# Patient Record
Sex: Female | Born: 1960 | Race: White | Hispanic: No | Marital: Married | Smoking: Current every day smoker
Health system: Southern US, Community
[De-identification: ages and names within clinical notes are randomized; demographics above are authoritative.]

## PROBLEM LIST (undated history)

## (undated) DIAGNOSIS — R011 Cardiac murmur, unspecified: Secondary | ICD-10-CM

## (undated) DIAGNOSIS — Z87898 Personal history of other specified conditions: Secondary | ICD-10-CM

## (undated) DIAGNOSIS — I10 Essential (primary) hypertension: Secondary | ICD-10-CM

## (undated) DIAGNOSIS — I447 Left bundle-branch block, unspecified: Secondary | ICD-10-CM

## (undated) DIAGNOSIS — E041 Nontoxic single thyroid nodule: Secondary | ICD-10-CM

## (undated) DIAGNOSIS — Z8632 Personal history of gestational diabetes: Secondary | ICD-10-CM

## (undated) DIAGNOSIS — M069 Rheumatoid arthritis, unspecified: Secondary | ICD-10-CM

## (undated) DIAGNOSIS — F17200 Nicotine dependence, unspecified, uncomplicated: Secondary | ICD-10-CM

## (undated) DIAGNOSIS — O24419 Gestational diabetes mellitus in pregnancy, unspecified control: Secondary | ICD-10-CM

## (undated) HISTORY — PX: COLON SURGERY: SHX602

## (undated) HISTORY — DX: Personal history of other specified conditions: Z87.898

## (undated) HISTORY — DX: Personal history of gestational diabetes: Z86.32

---

## 1990-11-28 HISTORY — PX: COLON SURGERY: SHX602

## 2005-06-12 ENCOUNTER — Observation Stay: Payer: Self-pay | Admitting: Infectious Diseases

## 2005-06-12 ENCOUNTER — Other Ambulatory Visit: Payer: Self-pay

## 2005-07-28 ENCOUNTER — Ambulatory Visit: Payer: Self-pay | Admitting: Internal Medicine

## 2005-09-12 ENCOUNTER — Ambulatory Visit: Payer: Self-pay | Admitting: Internal Medicine

## 2005-10-10 ENCOUNTER — Ambulatory Visit: Payer: Self-pay | Admitting: Internal Medicine

## 2005-11-14 ENCOUNTER — Ambulatory Visit: Payer: Self-pay | Admitting: Internal Medicine

## 2005-11-30 ENCOUNTER — Ambulatory Visit: Payer: Self-pay | Admitting: Internal Medicine

## 2006-02-13 ENCOUNTER — Ambulatory Visit: Payer: Self-pay | Admitting: Internal Medicine

## 2006-05-22 ENCOUNTER — Ambulatory Visit: Payer: Self-pay | Admitting: Internal Medicine

## 2006-05-30 ENCOUNTER — Ambulatory Visit: Payer: Self-pay | Admitting: Family Medicine

## 2006-06-06 ENCOUNTER — Ambulatory Visit: Payer: Self-pay | Admitting: Internal Medicine

## 2006-06-08 ENCOUNTER — Ambulatory Visit: Payer: Self-pay | Admitting: Internal Medicine

## 2006-06-26 ENCOUNTER — Ambulatory Visit: Payer: Self-pay | Admitting: Internal Medicine

## 2006-07-14 ENCOUNTER — Ambulatory Visit: Payer: Self-pay | Admitting: Internal Medicine

## 2006-07-24 ENCOUNTER — Ambulatory Visit: Payer: Self-pay | Admitting: Internal Medicine

## 2006-09-11 ENCOUNTER — Ambulatory Visit: Payer: Self-pay | Admitting: Internal Medicine

## 2006-12-05 ENCOUNTER — Ambulatory Visit: Payer: Self-pay | Admitting: Internal Medicine

## 2006-12-08 ENCOUNTER — Ambulatory Visit: Payer: Self-pay | Admitting: Internal Medicine

## 2007-02-26 ENCOUNTER — Encounter: Payer: Self-pay | Admitting: Internal Medicine

## 2007-02-26 DIAGNOSIS — K219 Gastro-esophageal reflux disease without esophagitis: Secondary | ICD-10-CM | POA: Insufficient documentation

## 2007-02-26 DIAGNOSIS — M109 Gout, unspecified: Secondary | ICD-10-CM | POA: Insufficient documentation

## 2007-02-26 DIAGNOSIS — I10 Essential (primary) hypertension: Secondary | ICD-10-CM | POA: Insufficient documentation

## 2007-02-27 ENCOUNTER — Encounter: Payer: Self-pay | Admitting: Internal Medicine

## 2007-04-02 ENCOUNTER — Emergency Department (HOSPITAL_COMMUNITY): Admission: EM | Admit: 2007-04-02 | Discharge: 2007-04-02 | Payer: Self-pay | Admitting: Emergency Medicine

## 2007-05-23 ENCOUNTER — Ambulatory Visit: Payer: Self-pay | Admitting: Internal Medicine

## 2007-05-23 DIAGNOSIS — R109 Unspecified abdominal pain: Secondary | ICD-10-CM | POA: Insufficient documentation

## 2008-03-31 ENCOUNTER — Telehealth (INDEPENDENT_AMBULATORY_CARE_PROVIDER_SITE_OTHER): Payer: Self-pay | Admitting: *Deleted

## 2009-05-11 ENCOUNTER — Emergency Department (HOSPITAL_COMMUNITY): Admission: EM | Admit: 2009-05-11 | Discharge: 2009-05-11 | Payer: Self-pay | Admitting: Emergency Medicine

## 2009-07-24 ENCOUNTER — Encounter: Admission: RE | Admit: 2009-07-24 | Discharge: 2009-07-24 | Payer: Self-pay | Admitting: Internal Medicine

## 2009-07-28 ENCOUNTER — Encounter: Admission: RE | Admit: 2009-07-28 | Discharge: 2009-07-28 | Payer: Self-pay | Admitting: Internal Medicine

## 2009-07-29 ENCOUNTER — Encounter: Admission: RE | Admit: 2009-07-29 | Discharge: 2009-07-29 | Payer: Self-pay | Admitting: Internal Medicine

## 2009-07-29 ENCOUNTER — Encounter (INDEPENDENT_AMBULATORY_CARE_PROVIDER_SITE_OTHER): Payer: Self-pay | Admitting: Diagnostic Radiology

## 2009-08-19 ENCOUNTER — Encounter: Admission: RE | Admit: 2009-08-19 | Discharge: 2009-08-19 | Payer: Self-pay | Admitting: Internal Medicine

## 2010-06-24 ENCOUNTER — Other Ambulatory Visit: Admission: RE | Admit: 2010-06-24 | Discharge: 2010-06-24 | Payer: Self-pay | Admitting: Obstetrics and Gynecology

## 2010-12-20 ENCOUNTER — Encounter: Payer: Self-pay | Admitting: Internal Medicine

## 2010-12-28 NOTE — Consult Note (Signed)
Summary: Consultation Report  Consultation Report   Imported By: Beau Fanny 05/23/2007 16:26:23  _____________________________________________________________________  External Attachment:    Type:   Image     Comment:   External Document

## 2010-12-28 NOTE — Assessment & Plan Note (Signed)
Summary: 2:00pm,vomiting/bir   Vital Signs:  Patient Profile:   50 Years Old Female Height:     62.75 inches (159.38 cm) Weight:      118.13 pounds Temp:     97.3 degrees F oral Pulse rate:   76 / minute BP sitting:   112 / 64  (left arm)  Vitals Entered By: Wandra Mannan (May 23, 2007 2:03 PM)               Chief Complaint:  vomiting  .  History of Present Illness: Didn't fell well 6/25PM Awoke the next day vomiting, stomach burning Noone else sick Feels weak, sleeping all the time Not eating much Vomiting has resolved but has constant belching for some time--occ with water brash  Was on prilosec but stopped--heard of possible heart problems No hematemesis Some loose and more freq stools.  Slight dark but not black and no blood  On prednisone from Dr Rowe--referred from Dr Lajoyce [facility] since April---has helped foot  No fever but felt clammy Has usual smokers cough, no breathing problems    Past Medical History:    Reviewed history from 02/26/2007 and no changes required:       GERD       Gout       Hypertension 2006     Review of Systems      See HPI   Physical Exam  General:     NAD but slightly washed out Lungs:     normal respiratory effort and normal breath sounds.   Abdomen:     soft.   Mild tenderness just above umbilicus. No rebound    Impression & Recommendations:  Problem # 1:  ABDOMINAL PAIN, ACUTE (ICD-789.00) Assessment: New she has abdominal pain associated with vomiting and fatigue.  She had ongoing problems with heartburn including belching and water brash.  She is now on prednisone which has been effective for her foot pain but may be causing gastritis or even an ulcer.  There is no signs that she's had GI bleeding now.    Plan: She is to back on her Prilosec.  She doesn't have any at home son is here to have some samples of something to get her started with tonight and then she gets a Armed forces operational officer.  She has ongoing  symptoms and probably needs this indefinitely and certainly as long as she is on the prednisone.  Medications Added to Medication List This Visit: 1)  Lisinopril 20 Mg Tabs (Lisinopril) .Marland Kitchen.. 1 daily 2)  Adult Aspirin Low Strength 81 Mg Tbdp (Aspirin) .Marland Kitchen.. 1 daily   Patient Instructions: 1)  Restart Prilosec and stay on every day. Take it before supper then every morning before breakfast      Current Medications (including changes made in today's visit):  LISINOPRIL 20 MG  TABS (LISINOPRIL) 1 daily ADULT ASPIRIN LOW STRENGTH 81 MG  TBDP (ASPIRIN) 1 daily

## 2010-12-28 NOTE — Progress Notes (Signed)
  Phone Note Refill Request Message from:  [phone]  Refills Requested: Medication #1:  LISINOPRIL 20 MG  TABS 1 daily  Method Requested: Fax to Local Pharmacy Initial call taken by: Wandra Mannan,  Mar 31, 2008 9:02 AM  Follow-up for Phone Call        okay x 1 year  Needs to schedule follow up visit Follow-up by: Cindee Salt MD,  Mar 31, 2008 10:13 AM  Additional Follow-up for Phone Call Additional follow up Details #1::        rx faxed Additional Follow-up by: Wandra Mannan,  Mar 31, 2008 10:23 AM

## 2011-03-07 LAB — CBC
HCT: 39.2 % (ref 36.0–46.0)
Hemoglobin: 13.3 g/dL (ref 12.0–15.0)
MCHC: 33.9 g/dL (ref 30.0–36.0)
MCV: 97.3 fL (ref 78.0–100.0)
Platelets: 275 10*3/uL (ref 150–400)
RBC: 4.03 MIL/uL (ref 3.87–5.11)
RDW: 13.5 % (ref 11.5–15.5)
WBC: 6.8 10*3/uL (ref 4.0–10.5)

## 2011-03-07 LAB — COMPREHENSIVE METABOLIC PANEL
ALT: 18 U/L (ref 0–35)
AST: 23 U/L (ref 0–37)
Albumin: 3.5 g/dL (ref 3.5–5.2)
Alkaline Phosphatase: 57 U/L (ref 39–117)
BUN: 4 mg/dL — ABNORMAL LOW (ref 6–23)
CO2: 26 mEq/L (ref 19–32)
Calcium: 9.1 mg/dL (ref 8.4–10.5)
Chloride: 112 mEq/L (ref 96–112)
Creatinine, Ser: 0.54 mg/dL (ref 0.4–1.2)
GFR calc Af Amer: >60 mL/min (ref >60)
GFR calc non Af Amer: >60 mL/min (ref >60)
Glucose, Bld: 92 mg/dL (ref 70–99)
Potassium: 4.1 mEq/L (ref 3.5–5.1)
Sodium: 143 mEq/L (ref 135–145)
Total Bilirubin: 0.5 mg/dL (ref 0.3–1.2)
Total Protein: 6.3 g/dL (ref 6.0–8.3)

## 2011-03-07 LAB — DIFFERENTIAL
Basophils Absolute: 0 10*3/uL (ref 0.0–0.1)
Basophils Relative: 0 % (ref 0–1)
Eosinophils Absolute: 0 10*3/uL (ref 0.0–0.7)
Eosinophils Relative: 0 % (ref 0–5)
Lymphocytes Relative: 12 % (ref 12–46)
Lymphs Abs: 0.8 10*3/uL (ref 0.7–4.0)
Monocytes Absolute: 0.5 10*3/uL (ref 0.1–1.0)
Monocytes Relative: 7 % (ref 3–12)
Neutro Abs: 5.4 10*3/uL (ref 1.7–7.7)
Neutrophils Relative %: 80 % — ABNORMAL HIGH (ref 43–77)

## 2011-03-07 LAB — POCT CARDIAC MARKERS
CKMB, poc: <1 ng/mL — ABNORMAL LOW (ref 1.0–8.0)
CKMB, poc: <1 ng/mL — ABNORMAL LOW (ref 1.0–8.0)
Myoglobin, poc: 25.8 ng/mL (ref 12–200)
Myoglobin, poc: 37.9 ng/mL (ref 12–200)
Troponin i, poc: <0.05 ng/mL (ref 0.00–0.09)
Troponin i, poc: <0.05 ng/mL (ref 0.00–0.09)

## 2011-03-07 LAB — MAGNESIUM: Magnesium: 2 mg/dL (ref 1.5–2.5)

## 2012-05-22 ENCOUNTER — Emergency Department (HOSPITAL_COMMUNITY)
Admission: EM | Admit: 2012-05-22 | Discharge: 2012-05-22 | Disposition: A | Discharge status: Home / Self Care | Payer: 59 | Source: Home / Self Care | Attending: Emergency Medicine | Admitting: Emergency Medicine

## 2012-05-22 ENCOUNTER — Encounter (HOSPITAL_COMMUNITY): Payer: Self-pay | Admitting: Emergency Medicine

## 2012-05-22 DIAGNOSIS — E860 Dehydration: Secondary | ICD-10-CM

## 2012-05-22 DIAGNOSIS — R112 Nausea with vomiting, unspecified: Secondary | ICD-10-CM

## 2012-05-22 HISTORY — DX: Rheumatoid arthritis, unspecified: M06.9

## 2012-05-22 HISTORY — DX: Essential (primary) hypertension: I10

## 2012-05-22 LAB — POCT I-STAT, CHEM 8
BUN: 4 mg/dL — ABNORMAL LOW (ref 6–23)
Calcium, Ion: 1.21 mmol/L (ref 1.12–1.32)
Chloride: 104 mEq/L (ref 96–112)
Creatinine, Ser: 0.6 mg/dL (ref 0.50–1.10)
Glucose, Bld: 108 mg/dL — ABNORMAL HIGH (ref 70–99)
HCT: 45 % (ref 36.0–46.0)
Hemoglobin: 15.3 g/dL — ABNORMAL HIGH (ref 12.0–15.0)
Potassium: 3.5 mEq/L (ref 3.5–5.1)
Sodium: 138 mEq/L (ref 135–145)
TCO2: 22 mmol/L (ref 0–100)

## 2012-05-22 MED ORDER — HYDROXYZINE HCL 25 MG PO TABS: 25.0000 mg | ORAL_TABLET | Freq: Four times a day (QID) | ORAL | Status: AC

## 2012-05-22 MED ORDER — ONDANSETRON HCL 4 MG PO TABS: 4.0000 mg | ORAL_TABLET | Freq: Three times a day (TID) | ORAL | Status: AC | PRN

## 2012-05-22 NOTE — ED Notes (Signed)
Instructed to place on gown for physician examination 

## 2012-05-22 NOTE — ED Provider Notes (Signed)
History     CSN: [id number]  Arrival date & time 05/22/12  1100   First MD Initiated Contact with Patient 05/22/12 1113      Chief Complaint  Patient presents with  . Emesis    (Consider location/radiation/quality/duration/timing/severity/associated sxs/prior treatment) HPI Comments: Patient process urgent care reporting that yesterday she had an upset stomach vomited 3-4 times and had about 2-3 episodes of liquidy diarrhea. She describes that her symptoms have subsided today she went to work but was feeling very weak and fatigued. She had only English muffin and a cold is morning she denies any fevers or abdominal pain at this point. She also is wondering if she had some type of allergic reaction she had several mosquito bites a couple days ago and had a multiple bites on her lower extremities and has some bruising in the outer aspect of her left leg that she attributes to kill and a mosquitoes.  She denies any abdominal pain, no further vomiting, no further rashes or ecchymotic areas anywhere else. No blood in urine, no swelling or tenderness in any joint.  Patient is a 51 y.o. female presenting with vomiting. The history is provided by the patient.  Emesis  This is a new problem. The current episode started yesterday. The problem occurs 5 to 10 times per day. The problem has been gradually improving. Associated symptoms include abdominal pain, chills, diarrhea and headaches. Pertinent negatives include no arthralgias, no cough, no fever, no myalgias and no URI.    Past Medical History  Diagnosis Date  . Hypertension   . Rheumatoid arthritis     Past Surgical History  Procedure Date  . Cesarean section     No family history on file.  History  Substance Use Topics  . Smoking status: Current Everyday Smoker  . Smokeless tobacco: Not on file  . Alcohol Use: Yes    OB History    Grav Para Term Preterm Abortions TAB SAB Ect Mult Living                  Review of Systems   Constitutional: Positive for chills and fatigue. Negative for fever, activity change and unexpected weight change.  Eyes: Negative for redness, itching and visual disturbance.  Respiratory: Negative for cough.   Gastrointestinal: Positive for vomiting, abdominal pain and diarrhea.  Musculoskeletal: Negative for myalgias and arthralgias.  Skin: Negative for rash and wound.  Neurological: Positive for headaches. Negative for dizziness and syncope.    Allergies  Review of patient's allergies indicates no known allergies.  Home Medications   Current Outpatient Rx  Name Route Sig Dispense Refill  . AMLODIPINE BESYLATE 5 MG PO TABS Oral Take 5 mg by mouth daily.    . ASPIRIN 325 MG PO TBEC Oral Take 325 mg by mouth daily.    . CERTOLIZUMAB PEGOL 2 X 200 MG Scurry KIT Subcutaneous Inject into the skin.    Marland Kitchen VITAMIN D PO Oral Take by mouth.    Marland Kitchen HYDROXYZINE HCL 25 MG PO TABS Oral Take 1 tablet (25 mg total) by mouth every 6 (six) hours. 12 tablet 0  . ONDANSETRON HCL 4 MG PO TABS Oral Take 1 tablet (4 mg total) by mouth every 8 (eight) hours as needed for nausea. 20 tablet 0    BP 138/87  Pulse 92  Temp 98.5 F (36.9 C) (Oral)  Resp 16  SpO2 99%  LMP 04/26/2012  Physical Exam  Nursing note and vitals reviewed. Constitutional: Vital signs  are normal. She appears well-developed and well-nourished.  Non-toxic appearance. She does not have a sickly appearance. She does not appear ill. No distress.  HENT:  Head: Normocephalic.  Eyes: Conjunctivae are normal.  Neck: Neck supple.  Pulmonary/Chest: Effort normal and breath sounds normal. She has no decreased breath sounds. She has no wheezes. She has no rhonchi. She has no rales.  Abdominal: Soft. Normal appearance. She exhibits no shifting dullness, no distension, no pulsatile liver and no fluid wave. There is no hepatosplenomegaly. There is generalized tenderness. There is no rigidity, no rebound, no guarding, no CVA tenderness, no tenderness  at McBurney's point and negative Murphy's sign.  Neurological: She is alert.  Skin: Rash noted. There is erythema.    ED Course  Procedures (including critical care time)  Labs Reviewed  POCT I-STAT, CHEM 8 - Abnormal; Notable for the following:    BUN 4 (*)     Glucose, Bld 108 (*)     Hemoglobin 15.3 (*)     All other components within normal limits   No results found.   1. Nausea & vomiting   2. Dehydration       MDM  Patient presented today to urgent care with 2 different concerns both a new lead develop rash and resolving upper abdominal discomfort with vomiting and diarrhea. Patient with mild clinical dehydration normal labs normal abdominal exam looks comfortable. A papular eruption to both dorsal aspect of both forearms she was encouraged to followup with [facility] she's been described that this rash has been there for years. She also had a rash secondary to mosquito bites and 2 ecchymotic areas on the lateral aspect of her left leg. It would probably trauma induced and she was trying to kill dust mosquitoes. Have encouraged patient to manage her symptoms symptomatically for the next 48 hours and discussed symptoms that would warrant further evaluation in the emergency department. He requested a work note and she is feeling somewhat tired and drained with vomiting and diarrhea yesterday. I agreed to give her 48 hours off work        Jimmie Molly, MD 05/22/12 1359

## 2012-05-22 NOTE — ED Notes (Signed)
Patient concerned symptoms related to mosquitto bites on back of left leg.  2 large areas of bruising

## 2012-05-22 NOTE — Discharge Instructions (Signed)
Dehydration, Adult Dehydration means your body does not have as much fluid as it needs. Your kidneys, brain, and heart will not work properly without the right amount of fluids and salt.  HOME CARE  Ask your doctor how to replace body fluid losses (rehydrate).   Drink enough fluids to keep your pee (urine) clear or pale yellow.   Drink small amounts of fluids often if you feel sick to your stomach (nauseous) or throw up (vomit).   Eat like you normally do.   Avoid:   Foods or drinks high in sugar.   Bubbly (carbonated) drinks.   Juice.   Very hot or cold fluids.   Drinks with caffeine.   Fatty, greasy foods.   Alcohol.   Tobacco.   Eating too much.   Gelatin desserts.   Wash your hands to avoid spreading germs (bacteria, viruses).   Only take medicine as told by your doctor.   Keep all doctor visits as told.  GET HELP RIGHT AWAY IF:   You cannot drink something without throwing up.   You get worse even with treatment.   Your vomit has blood in it or looks greenish.   Your poop (stool) has blood in it or looks black and tarry.   You have not peed in 6 to 8 hours.   You pee a small amount of very dark pee.   You have a fever.   You pass out (faint).   You have belly (abdominal) pain that gets worse or stays in one spot (localizes).   You have a rash, stiff neck, or bad headache.   You get easily annoyed, sleepy, or are hard to wake up.   You feel weak, dizzy, or very thirsty.  MAKE SURE YOU:   Understand these instructions.   Will watch your condition.   Will get help right away if you are not doing well or get worse.  Document Released: 09/10/2009 Document Revised: 11/03/2011 Document Reviewed: 07/04/2011 Exit[employer] Patient Information 2012 [employer]. 

## 2012-05-22 NOTE — ED Notes (Signed)
Yesterday had upset stomach reports vomiting and diarrhea.  No episodes of v/d during the night.  Today went to work and had episode of feeling weak.  Reports feeling faint, did not pass out.  Patient had an english muffin and coke this am prior to episode.  Denies fever.  Reports chills this am.

## 2012-08-21 ENCOUNTER — Other Ambulatory Visit: Payer: Self-pay | Admitting: Obstetrics and Gynecology

## 2012-08-21 ENCOUNTER — Other Ambulatory Visit (HOSPITAL_COMMUNITY)
Admission: RE | Admit: 2012-08-21 | Discharge: 2012-08-21 | Disposition: A | Discharge status: Home / Self Care | Payer: 59 | Source: Ambulatory Visit | Attending: Obstetrics and Gynecology | Admitting: Obstetrics and Gynecology

## 2012-08-21 DIAGNOSIS — Z01419 Encounter for gynecological examination (general) (routine) without abnormal findings: Secondary | ICD-10-CM | POA: Insufficient documentation

## 2012-09-13 ENCOUNTER — Other Ambulatory Visit: Payer: Self-pay | Admitting: Obstetrics and Gynecology

## 2012-11-26 ENCOUNTER — Encounter (HOSPITAL_COMMUNITY): Payer: Self-pay | Admitting: Pharmacist

## 2012-12-02 ENCOUNTER — Other Ambulatory Visit: Payer: Self-pay | Admitting: Obstetrics and Gynecology

## 2012-12-03 ENCOUNTER — Other Ambulatory Visit: Payer: Self-pay

## 2012-12-03 ENCOUNTER — Encounter (HOSPITAL_COMMUNITY)
Admission: RE | Admit: 2012-12-03 | Discharge: 2012-12-03 | Disposition: A | Discharge status: Home / Self Care | Payer: 59 | Source: Ambulatory Visit | Attending: Obstetrics and Gynecology | Admitting: Obstetrics and Gynecology

## 2012-12-03 ENCOUNTER — Encounter (HOSPITAL_COMMUNITY): Payer: Self-pay

## 2012-12-03 DIAGNOSIS — Z01818 Encounter for other preprocedural examination: Secondary | ICD-10-CM | POA: Insufficient documentation

## 2012-12-03 DIAGNOSIS — Z01812 Encounter for preprocedural laboratory examination: Secondary | ICD-10-CM | POA: Insufficient documentation

## 2012-12-03 HISTORY — DX: Nicotine dependence, unspecified, uncomplicated: F17.200

## 2012-12-03 LAB — COMPREHENSIVE METABOLIC PANEL
ALT: 26 U/L (ref 0–35)
AST: 30 U/L (ref 0–37)
Albumin: 3.6 g/dL (ref 3.5–5.2)
Alkaline Phosphatase: 64 U/L (ref 39–117)
BUN: 6 mg/dL (ref 6–23)
CO2: 23 mEq/L (ref 19–32)
Calcium: 9.2 mg/dL (ref 8.4–10.5)
Chloride: 99 mEq/L (ref 96–112)
Creatinine, Ser: 0.48 mg/dL — ABNORMAL LOW (ref 0.50–1.10)
GFR calc Af Amer: >90 mL/min (ref >90)
GFR calc non Af Amer: >90 mL/min (ref >90)
Glucose, Bld: 161 mg/dL — ABNORMAL HIGH (ref 70–99)
Potassium: 3 mEq/L — ABNORMAL LOW (ref 3.5–5.1)
Sodium: 135 mEq/L (ref 135–145)
Total Bilirubin: 0.4 mg/dL (ref 0.3–1.2)
Total Protein: 6.8 g/dL (ref 6.0–8.3)

## 2012-12-03 LAB — TYPE AND SCREEN
ABO/RH(D): O NEG
Antibody Screen: NEGATIVE

## 2012-12-03 LAB — CBC
HCT: 40.4 % (ref 36.0–46.0)
Hemoglobin: 14 g/dL (ref 12.0–15.0)
MCH: 31.7 pg (ref 26.0–34.0)
MCHC: 34.7 g/dL (ref 30.0–36.0)
MCV: 91.4 fL (ref 78.0–100.0)
Platelets: 275 10*3/uL (ref 150–400)
RBC: 4.42 MIL/uL (ref 3.87–5.11)
RDW: 13.7 % (ref 11.5–15.5)
WBC: 7.1 10*3/uL (ref 4.0–10.5)

## 2012-12-03 LAB — SURGICAL PCR SCREEN
MRSA, PCR: NEGATIVE
Staphylococcus aureus: NEGATIVE

## 2012-12-03 LAB — ABO/RH: ABO/RH(D): O NEG

## 2012-12-03 NOTE — Progress Notes (Signed)
Labs back potassium low at 3, [name] notified at [name] Physician OB[facility]GY[facility]

## 2012-12-03 NOTE — Patient Instructions (Addendum)
   Your procedure is scheduled [id number] January 7th  Enter through the Main Entrance of [facility] at:11am Pick up the phone at the desk and dial [phone] and inform us of your arrival.  Please call this number if you have any problems the morning of surgery: [phone]  Remember: Do not eat food after midnight on Monday You may have clear liquids until 8:30am on Tuesday then nothing Please take your norvasc morning of surgery  Do not wear jewelry, make-up, or FINGER nail polish No metal in your hair or on your body. Do not wear lotions, powders, perfumes. You may wear deodorant.  Please use your CHG wash as directed prior to surgery.  Do not shave anywhere for at least 12 hours prior to first CHG shower.  Do not bring valuables to the hospital. Contacts, dentures or bridgework may not be worn into surgery.  Leave suitcase in the car. After Surgery it may be brought to your room. For patients being admitted to the hospital, checkout time is 11:00am the day of discharge.  Patients discharged on the day of surgery will not be allowed to drive home.

## 2012-12-03 NOTE — Pre-Procedure Instructions (Signed)
Spoke to Anesthesiologist concerning patient status-history of smoking, hypertenison (b/p at pat visit 138/80), reviewed new ekg with LBBB and EKG from 2010. Dr Rodman Pickle wants Cardiac Clearance prior to surgery. Dr Richardson Dopp notified-Dr Richardson Dopp to call [facility] for patient appointment -returned call and spoke to patient. Surgery is cancelled

## 2012-12-04 ENCOUNTER — Encounter (HOSPITAL_COMMUNITY): Admission: RE | Payer: Self-pay | Source: Ambulatory Visit

## 2012-12-04 ENCOUNTER — Ambulatory Visit (HOSPITAL_COMMUNITY): Admission: RE | Admit: 2012-12-04 | Payer: 59 | Source: Ambulatory Visit | Admitting: Obstetrics and Gynecology

## 2012-12-04 SURGERY — ROBOTIC ASSISTED TOTAL HYSTERECTOMY WITH BILATERAL SALPINGO OOPHORECTOMY
Anesthesia: Choice | Laterality: Bilateral

## 2012-12-19 ENCOUNTER — Inpatient Hospital Stay (HOSPITAL_COMMUNITY)
Admission: RE | Admit: 2012-12-19 | Discharge: 2012-12-21 | DRG: 742 | Disposition: A | Discharge status: Home / Self Care | Payer: 59 | Source: Ambulatory Visit | Attending: Obstetrics and Gynecology | Admitting: Obstetrics and Gynecology

## 2012-12-19 ENCOUNTER — Encounter (HOSPITAL_COMMUNITY): Payer: Self-pay | Admitting: Anesthesiology

## 2012-12-19 ENCOUNTER — Ambulatory Visit (HOSPITAL_COMMUNITY): Payer: 59 | Admitting: Anesthesiology

## 2012-12-19 ENCOUNTER — Encounter (HOSPITAL_COMMUNITY): Payer: Self-pay | Admitting: *Deleted

## 2012-12-19 ENCOUNTER — Encounter (HOSPITAL_COMMUNITY)
Admission: RE | Disposition: A | Discharge status: Home / Self Care | Payer: Self-pay | Source: Ambulatory Visit | Attending: Obstetrics and Gynecology

## 2012-12-19 DIAGNOSIS — D252 Subserosal leiomyoma of uterus: Secondary | ICD-10-CM | POA: Diagnosis present

## 2012-12-19 DIAGNOSIS — N801 Endometriosis of ovary: Secondary | ICD-10-CM | POA: Diagnosis present

## 2012-12-19 DIAGNOSIS — Z5331 Laparoscopic surgical procedure converted to open procedure: Secondary | ICD-10-CM

## 2012-12-19 DIAGNOSIS — Y9210 Unspecified residential institution as the place of occurrence of the external cause: Secondary | ICD-10-CM | POA: Diagnosis not present

## 2012-12-19 DIAGNOSIS — D219 Benign neoplasm of connective and other soft tissue, unspecified: Secondary | ICD-10-CM | POA: Diagnosis present

## 2012-12-19 DIAGNOSIS — IMO0002 Reserved for concepts with insufficient information to code with codable children: Secondary | ICD-10-CM | POA: Diagnosis not present

## 2012-12-19 DIAGNOSIS — N838 Other noninflammatory disorders of ovary, fallopian tube and broad ligament: Secondary | ICD-10-CM | POA: Diagnosis present

## 2012-12-19 DIAGNOSIS — I10 Essential (primary) hypertension: Secondary | ICD-10-CM | POA: Diagnosis present

## 2012-12-19 DIAGNOSIS — N8000 Endometriosis of the uterus, unspecified: Secondary | ICD-10-CM | POA: Diagnosis present

## 2012-12-19 DIAGNOSIS — N938 Other specified abnormal uterine and vaginal bleeding: Secondary | ICD-10-CM | POA: Diagnosis present

## 2012-12-19 DIAGNOSIS — D251 Intramural leiomyoma of uterus: Principal | ICD-10-CM | POA: Diagnosis present

## 2012-12-19 DIAGNOSIS — Z9079 Acquired absence of other genital organ(s): Secondary | ICD-10-CM

## 2012-12-19 DIAGNOSIS — N925 Other specified irregular menstruation: Secondary | ICD-10-CM | POA: Diagnosis present

## 2012-12-19 DIAGNOSIS — Z90710 Acquired absence of both cervix and uterus: Secondary | ICD-10-CM

## 2012-12-19 DIAGNOSIS — N80109 Endometriosis of ovary, unspecified side, unspecified depth: Secondary | ICD-10-CM | POA: Diagnosis present

## 2012-12-19 DIAGNOSIS — N946 Dysmenorrhea, unspecified: Secondary | ICD-10-CM | POA: Diagnosis present

## 2012-12-19 DIAGNOSIS — N949 Unspecified condition associated with female genital organs and menstrual cycle: Secondary | ICD-10-CM | POA: Diagnosis present

## 2012-12-19 HISTORY — PX: CYSTOSCOPY: SHX5120

## 2012-12-19 HISTORY — PX: ROBOTIC ASSISTED TOTAL HYSTERECTOMY: SHX6085

## 2012-12-19 HISTORY — PX: ABDOMINAL HYSTERECTOMY: SHX81

## 2012-12-19 HISTORY — PX: BILATERAL SALPINGECTOMY: SHX5743

## 2012-12-19 LAB — BASIC METABOLIC PANEL
BUN: 7 mg/dL (ref 6–23)
CO2: 22 mEq/L (ref 19–32)
Calcium: 8.7 mg/dL (ref 8.4–10.5)
Chloride: 99 mEq/L (ref 96–112)
Creatinine, Ser: 0.51 mg/dL (ref 0.50–1.10)
GFR calc Af Amer: >90 mL/min (ref >90)
GFR calc non Af Amer: >90 mL/min (ref >90)
Glucose, Bld: 93 mg/dL (ref 70–99)
Potassium: 3.2 mEq/L — ABNORMAL LOW (ref 3.5–5.1)
Sodium: 133 mEq/L — ABNORMAL LOW (ref 135–145)

## 2012-12-19 LAB — PREGNANCY, URINE: Preg Test, Ur: NEGATIVE

## 2012-12-19 SURGERY — ROBOTIC ASSISTED TOTAL HYSTERECTOMY
Anesthesia: General | Wound class: Clean Contaminated

## 2012-12-19 MED ORDER — NEOSTIGMINE METHYLSULFATE 1 MG/ML IJ SOLN
INTRAMUSCULAR | Status: AC
  Filled 2012-12-19: qty 1

## 2012-12-19 MED ORDER — IBUPROFEN 800 MG PO TABS
800.0000 mg | ORAL_TABLET | Freq: Three times a day (TID) | ORAL | Status: DC | PRN
  Administered 2012-12-20 – 2012-12-21 (×2): 800 mg via ORAL
  Filled 2012-12-19 (×2): qty 1

## 2012-12-19 MED ORDER — STERILE WATER FOR IRRIGATION IR SOLN
Status: DC | PRN
  Administered 2012-12-19: 1000 mL

## 2012-12-19 MED ORDER — MIDAZOLAM HCL 2 MG/2ML IJ SOLN
INTRAMUSCULAR | Status: AC
  Filled 2012-12-19: qty 2

## 2012-12-19 MED ORDER — KETOROLAC TROMETHAMINE 30 MG/ML IJ SOLN
INTRAMUSCULAR | Status: DC | PRN
  Administered 2012-12-19: 30 mg via INTRAVENOUS

## 2012-12-19 MED ORDER — HYDROMORPHONE HCL PF 1 MG/ML IJ SOLN
0.2500 mg | INTRAMUSCULAR | Status: DC | PRN
  Administered 2012-12-19 (×2): 0.5 mg via INTRAVENOUS

## 2012-12-19 MED ORDER — FENTANYL CITRATE 0.05 MG/ML IJ SOLN
INTRAMUSCULAR | Status: AC
  Filled 2012-12-19: qty 5

## 2012-12-19 MED ORDER — ONDANSETRON HCL 4 MG/2ML IJ SOLN
INTRAMUSCULAR | Status: AC
  Filled 2012-12-19: qty 2

## 2012-12-19 MED ORDER — NALOXONE HCL 0.4 MG/ML IJ SOLN: 0.4000 mg | INTRAMUSCULAR | Status: DC | PRN

## 2012-12-19 MED ORDER — KETOROLAC TROMETHAMINE 30 MG/ML IJ SOLN
INTRAMUSCULAR | Status: AC
  Filled 2012-12-19: qty 1

## 2012-12-19 MED ORDER — OXYCODONE-ACETAMINOPHEN 5-325 MG PO TABS
1.0000 | ORAL_TABLET | ORAL | Status: DC | PRN
  Administered 2012-12-20: 1 via ORAL
  Administered 2012-12-20: 2 via ORAL
  Filled 2012-12-19: qty 1
  Filled 2012-12-19: qty 2

## 2012-12-19 MED ORDER — DIPHENHYDRAMINE HCL 12.5 MG/5ML PO ELIX: 12.5000 mg | ORAL_SOLUTION | Freq: Four times a day (QID) | ORAL | Status: DC | PRN

## 2012-12-19 MED ORDER — AMLODIPINE BESYLATE 5 MG PO TABS
5.0000 mg | ORAL_TABLET | Freq: Every day | ORAL | Status: DC
Start: 2012-12-20 — End: 2012-12-21
  Administered 2012-12-20 – 2012-12-21 (×2): 5 mg via ORAL
  Filled 2012-12-19 (×2): qty 1

## 2012-12-19 MED ORDER — DIPHENHYDRAMINE HCL 50 MG/ML IJ SOLN: 12.5000 mg | Freq: Four times a day (QID) | INTRAMUSCULAR | Status: DC | PRN

## 2012-12-19 MED ORDER — ROCURONIUM BROMIDE 100 MG/10ML IV SOLN
INTRAVENOUS | Status: DC | PRN
  Administered 2012-12-19: 40 mg via INTRAVENOUS
  Administered 2012-12-19: 5 mg via INTRAVENOUS
  Administered 2012-12-19: 10 mg via INTRAVENOUS
  Administered 2012-12-19 (×2): 20 mg via INTRAVENOUS
  Administered 2012-12-19 (×3): 10 mg via INTRAVENOUS

## 2012-12-19 MED ORDER — LACTATED RINGERS IV SOLN
INTRAVENOUS | Status: DC
  Administered 2012-12-19: 50 mL/h via INTRAVENOUS
  Administered 2012-12-19 (×4): via INTRAVENOUS

## 2012-12-19 MED ORDER — HYDROMORPHONE HCL PF 1 MG/ML IJ SOLN
INTRAMUSCULAR | Status: AC
  Administered 2012-12-19: 0.5 mg via INTRAVENOUS
  Filled 2012-12-19: qty 1

## 2012-12-19 MED ORDER — METHYLENE BLUE 1 % INJ SOLN
INTRAMUSCULAR | Status: AC
  Filled 2012-12-19: qty 1

## 2012-12-19 MED ORDER — NEOSTIGMINE METHYLSULFATE 1 MG/ML IJ SOLN
INTRAMUSCULAR | Status: DC | PRN
  Administered 2012-12-19: 3 mg via INTRAVENOUS

## 2012-12-19 MED ORDER — LIDOCAINE HCL (CARDIAC) 20 MG/ML IV SOLN
INTRAVENOUS | Status: AC
  Filled 2012-12-19: qty 5

## 2012-12-19 MED ORDER — PNEUMOCOCCAL VAC POLYVALENT 25 MCG/0.5ML IJ INJ
0.5000 mL | INJECTION | INTRAMUSCULAR | Status: AC
  Administered 2012-12-20: 0.5 mL via INTRAMUSCULAR
  Filled 2012-12-19: qty 0.5

## 2012-12-19 MED ORDER — EPHEDRINE SULFATE 50 MG/ML IJ SOLN
INTRAMUSCULAR | Status: DC | PRN
  Administered 2012-12-19: 5 mg via INTRAVENOUS
  Administered 2012-12-19: 10 mg via INTRAVENOUS
  Administered 2012-12-19: 5 mg via INTRAVENOUS
  Administered 2012-12-19: 10 mg via INTRAVENOUS
  Administered 2012-12-19 (×3): 5 mg via INTRAVENOUS

## 2012-12-19 MED ORDER — DEXAMETHASONE SODIUM PHOSPHATE 10 MG/ML IJ SOLN
INTRAMUSCULAR | Status: DC | PRN
  Administered 2012-12-19: 10 mg via INTRAVENOUS

## 2012-12-19 MED ORDER — HYDROMORPHONE 0.3 MG/ML IV SOLN
INTRAVENOUS | Status: DC
  Administered 2012-12-19: 0.6 mg via INTRAVENOUS
  Administered 2012-12-19: 19:00:00 via INTRAVENOUS
  Administered 2012-12-20: 0.3 mg via INTRAVENOUS
  Administered 2012-12-20: 0.9 mg via INTRAVENOUS
  Filled 2012-12-19: qty 25

## 2012-12-19 MED ORDER — MEPERIDINE HCL 25 MG/ML IJ SOLN: 6.2500 mg | INTRAMUSCULAR | Status: DC | PRN

## 2012-12-19 MED ORDER — PROPOFOL 10 MG/ML IV EMUL
INTRAVENOUS | Status: DC | PRN
  Administered 2012-12-19: 20 mg via INTRAVENOUS
  Administered 2012-12-19: 150 mg via INTRAVENOUS

## 2012-12-19 MED ORDER — POTASSIUM CHLORIDE CRYS ER 20 MEQ PO TBCR
20.0000 meq | EXTENDED_RELEASE_TABLET | Freq: Every day | ORAL | Status: DC
  Administered 2012-12-19: 20 meq via ORAL
  Filled 2012-12-19 (×2): qty 1

## 2012-12-19 MED ORDER — ONDANSETRON HCL 4 MG/2ML IJ SOLN: 4.0000 mg | Freq: Four times a day (QID) | INTRAMUSCULAR | Status: DC | PRN

## 2012-12-19 MED ORDER — ROPIVACAINE HCL 5 MG/ML IJ SOLN
INTRAMUSCULAR | Status: DC | PRN
  Administered 2012-12-19: 120 mL via EPIDURAL

## 2012-12-19 MED ORDER — EPHEDRINE 5 MG/ML INJ
INTRAVENOUS | Status: AC
  Filled 2012-12-19: qty 10

## 2012-12-19 MED ORDER — MIDAZOLAM HCL 5 MG/5ML IJ SOLN
INTRAMUSCULAR | Status: DC | PRN
  Administered 2012-12-19: 2 mg via INTRAVENOUS

## 2012-12-19 MED ORDER — ACETAMINOPHEN 10 MG/ML IV SOLN
1000.0000 mg | Freq: Once | INTRAVENOUS | Status: AC
  Administered 2012-12-19: 1000 mg via INTRAVENOUS
  Filled 2012-12-19: qty 100

## 2012-12-19 MED ORDER — SODIUM CHLORIDE 0.9 % IJ SOLN: 9.0000 mL | INTRAMUSCULAR | Status: DC | PRN

## 2012-12-19 MED ORDER — ROPIVACAINE HCL 5 MG/ML IJ SOLN
INTRAMUSCULAR | Status: AC
  Filled 2012-12-19: qty 60

## 2012-12-19 MED ORDER — ROCURONIUM BROMIDE 50 MG/5ML IV SOLN
INTRAVENOUS | Status: AC
  Filled 2012-12-19: qty 1

## 2012-12-19 MED ORDER — PROMETHAZINE HCL 25 MG/ML IJ SOLN: 6.2500 mg | INTRAMUSCULAR | Status: DC | PRN

## 2012-12-19 MED ORDER — LIDOCAINE HCL (CARDIAC) 20 MG/ML IV SOLN
INTRAVENOUS | Status: DC | PRN
  Administered 2012-12-19: 50 mg via INTRAVENOUS

## 2012-12-19 MED ORDER — SIMETHICONE 80 MG PO CHEW
80.0000 mg | CHEWABLE_TABLET | Freq: Four times a day (QID) | ORAL | Status: DC | PRN
  Administered 2012-12-20: 80 mg via ORAL

## 2012-12-19 MED ORDER — FENTANYL CITRATE 0.05 MG/ML IJ SOLN
INTRAMUSCULAR | Status: DC | PRN
  Administered 2012-12-19 (×2): 50 ug via INTRAVENOUS
  Administered 2012-12-19: 100 ug via INTRAVENOUS
  Administered 2012-12-19: 50 ug via INTRAVENOUS
  Administered 2012-12-19: 100 ug via INTRAVENOUS
  Administered 2012-12-19 (×3): 50 ug via INTRAVENOUS

## 2012-12-19 MED ORDER — KETOROLAC TROMETHAMINE 30 MG/ML IJ SOLN
30.0000 mg | Freq: Three times a day (TID) | INTRAMUSCULAR | Status: AC
  Administered 2012-12-19 – 2012-12-20 (×2): 30 mg via INTRAVENOUS
  Filled 2012-12-19 (×2): qty 1

## 2012-12-19 MED ORDER — KETOROLAC TROMETHAMINE 30 MG/ML IJ SOLN: 15.0000 mg | Freq: Once | INTRAMUSCULAR | Status: DC | PRN

## 2012-12-19 MED ORDER — INDIGOTINDISULFONATE SODIUM 8 MG/ML IJ SOLN
INTRAMUSCULAR | Status: DC | PRN
  Administered 2012-12-19: 40 mg via INTRAVENOUS

## 2012-12-19 MED ORDER — GLYCOPYRROLATE 0.2 MG/ML IJ SOLN
INTRAMUSCULAR | Status: DC | PRN
  Administered 2012-12-19: 0.4 mg via INTRAVENOUS

## 2012-12-19 MED ORDER — INDIGOTINDISULFONATE SODIUM 8 MG/ML IJ SOLN
INTRAMUSCULAR | Status: AC
  Filled 2012-12-19: qty 5

## 2012-12-19 MED ORDER — KETOROLAC TROMETHAMINE 30 MG/ML IJ SOLN: 30.0000 mg | Freq: Three times a day (TID) | INTRAMUSCULAR | Status: AC

## 2012-12-19 MED ORDER — PROPOFOL 10 MG/ML IV EMUL
INTRAVENOUS | Status: AC
  Filled 2012-12-19: qty 20

## 2012-12-19 MED ORDER — ONDANSETRON HCL 4 MG/2ML IJ SOLN
INTRAMUSCULAR | Status: DC | PRN
  Administered 2012-12-19: 4 mg via INTRAVENOUS

## 2012-12-19 MED ORDER — POTASSIUM CHLORIDE 20 MEQ PO PACK: 20.0000 meq | PACK | Freq: Every day | ORAL | Status: DC

## 2012-12-19 MED ORDER — CEFAZOLIN SODIUM-DEXTROSE 2-3 GM-% IV SOLR
2.0000 g | INTRAVENOUS | Status: AC
  Administered 2012-12-19: 2 g via INTRAVENOUS

## 2012-12-19 MED ORDER — GLYCOPYRROLATE 0.2 MG/ML IJ SOLN
INTRAMUSCULAR | Status: AC
  Filled 2012-12-19: qty 2

## 2012-12-19 MED ORDER — CEFAZOLIN SODIUM-DEXTROSE 2-3 GM-% IV SOLR
INTRAVENOUS | Status: AC
  Filled 2012-12-19: qty 50

## 2012-12-19 SURGICAL SUPPLY — 60 items
BAG URINE DRAINAGE (UROLOGICAL SUPPLIES) ×3 IMPLANT
BARRIER ADHS 3X4 INTERCEED (GAUZE/BANDAGES/DRESSINGS) ×3 IMPLANT
BLADE SURG 10 STRL SS (BLADE) ×3 IMPLANT
CATH FOLEY 3WAY  5CC 16FR (CATHETERS) ×1
CATH FOLEY 3WAY 5CC 16FR (CATHETERS) ×2 IMPLANT
CONT PATH 16OZ SNAP LID 3702 (MISCELLANEOUS) ×3 IMPLANT
COVER MAYO STAND STRL (DRAPES) ×3 IMPLANT
COVER TABLE BACK 60X90 (DRAPES) ×6 IMPLANT
COVER TIP SHEARS 8 DVNC (MISCELLANEOUS) ×2 IMPLANT
COVER TIP SHEARS 8MM DA VINCI (MISCELLANEOUS) ×1
DECANTER SPIKE VIAL GLASS SM (MISCELLANEOUS) ×3 IMPLANT
DERMABOND ADVANCED (GAUZE/BANDAGES/DRESSINGS) ×1
DERMABOND ADVANCED .7 DNX12 (GAUZE/BANDAGES/DRESSINGS) ×2 IMPLANT
DILATOR CANAL MILEX (MISCELLANEOUS) ×3 IMPLANT
DRAPE HUG U DISPOSABLE (DRAPE) ×3 IMPLANT
DRAPE LG THREE QUARTER DISP (DRAPES) ×6 IMPLANT
DRAPE WARM FLUID 44X44 (DRAPE) ×3 IMPLANT
ELECT REM PT RETURN 9FT ADLT (ELECTROSURGICAL) ×3
ELECTRODE REM PT RTRN 9FT ADLT (ELECTROSURGICAL) ×2 IMPLANT
EVACUATOR SMOKE 8.L (FILTER) ×3 IMPLANT
GAUZE VASELINE 3X9 (GAUZE/BANDAGES/DRESSINGS) IMPLANT
GLOVE BIO SURGEON STRL SZ7 (GLOVE) IMPLANT
GLOVE BIOGEL M 6.5 STRL (GLOVE) ×9 IMPLANT
GLOVE BIOGEL PI IND STRL 6.5 (GLOVE) ×4 IMPLANT
GLOVE BIOGEL PI IND STRL 7.0 (GLOVE) ×10 IMPLANT
GLOVE BIOGEL PI INDICATOR 6.5 (GLOVE) ×2
GLOVE BIOGEL PI INDICATOR 7.0 (GLOVE) ×5
GOWN STRL REIN XL XLG (GOWN DISPOSABLE) ×18 IMPLANT
KIT ACCESSORY DA VINCI DISP (KITS) ×1
KIT ACCESSORY DVNC DISP (KITS) ×2 IMPLANT
LEGGING LITHOTOMY PAIR STRL (DRAPES) ×3 IMPLANT
OCCLUDER COLPOPNEUMO (BALLOONS) IMPLANT
PACK LAVH (CUSTOM PROCEDURE TRAY) ×3 IMPLANT
PAD PREP 24X48 CUFFED NSTRL (MISCELLANEOUS) ×6 IMPLANT
PENCIL BUTTON HOLSTER BLD 10FT (ELECTRODE) ×3 IMPLANT
PLUG CATH AND CAP STER (CATHETERS) ×3 IMPLANT
PROTECTOR NERVE ULNAR (MISCELLANEOUS) ×6 IMPLANT
SET CYSTO W/LG BORE CLAMP LF (SET/KITS/TRAYS/PACK) ×3 IMPLANT
SET IRRIG TUBING LAPAROSCOPIC (IRRIGATION / IRRIGATOR) ×3 IMPLANT
SOLUTION ELECTROLUBE (MISCELLANEOUS) ×3 IMPLANT
SPONGE LAP 18X18 X RAY DECT (DISPOSABLE) ×6 IMPLANT
SUT VIC AB 0 CT1 27 (SUTURE) ×2
SUT VIC AB 0 CT1 27XBRD ANBCTR (SUTURE) ×4 IMPLANT
SUT VICRYL 0 27 CT2 27 ABS (SUTURE) ×15 IMPLANT
SUT VICRYL 0 UR6 27IN ABS (SUTURE) ×3 IMPLANT
SUT VICRYL RAPIDE 4/0 PS 2 (SUTURE) ×6 IMPLANT
SYR 50ML LL SCALE MARK (SYRINGE) ×6 IMPLANT
TIP RUMI ORANGE 6.7MMX12CM (TIP) IMPLANT
TIP UTERINE 5.1X6CM LAV DISP (MISCELLANEOUS) IMPLANT
TIP UTERINE 6.7X10CM GRN DISP (MISCELLANEOUS) IMPLANT
TIP UTERINE 6.7X6CM WHT DISP (MISCELLANEOUS) IMPLANT
TIP UTERINE 6.7X8CM BLUE DISP (MISCELLANEOUS) IMPLANT
TOWEL OR 17X24 6PK STRL BLUE (TOWEL DISPOSABLE) ×9 IMPLANT
TROCAR 12M 150ML BLUNT (TROCAR) ×3 IMPLANT
TROCAR DISP BLADELESS 8 DVNC (TROCAR) ×2 IMPLANT
TROCAR DISP BLADELESS 8MM (TROCAR) ×1
TROCAR XCEL NON-BLD 11X100MML (ENDOMECHANICALS) ×3 IMPLANT
TUBING FILTER THERMOFLATOR (ELECTROSURGICAL) ×3 IMPLANT
WARMER LAPAROSCOPE (MISCELLANEOUS) ×3 IMPLANT
WATER STERILE IRR 1000ML POUR (IV SOLUTION) ×9 IMPLANT

## 2012-12-19 NOTE — Op Note (Signed)
12/19/2012  5:18 PM  PATIENT:  Kim Braun  51 y.o. female  PRE-OPERATIVE DIAGNOSIS:  Fibroids; Dysfunctional Uterine Bleeding; Dysmenorrhea 58262  POST-OPERATIVE DIAGNOSIS:  Fibroids; Dysfunctional Uterine Bleeding; Dysmenorrhea [id number]  PROCEDURE:  Procedure(s) (LRB) with comments: ROBOTIC ASSISTED TOTAL HYSTERECTOMY (N/A) - Attempted Robotic Total Hysterectomy BILATERAL SALPINGECTOMY (Bilateral) HYSTERECTOMY ABDOMINAL (N/A) - Incision @ 1517 CYSTOSCOPY (N/A)  Cystotomy Repair.   SURGEON:  Surgeon(S) and Role:    * Dorien Chihuahua. Richardson Dopp, MD - Primary    * Geryl Rankins, MD - Assisting  PHYSICIAN ASSISTANT: None  ASSISTANTS: none   ANESTHESIA:   general  EBL:  Total I/O In: 4300 [I.V.:4300] Out: 740 [Urine:510; Blood:230]  BLOOD ADMINISTERED:none  DRAINS: foley catheter   LOCAL MEDICATIONS USED:  OTHER ropivacaine   SPECIMEN:  Source of Specimen:  uterus cervix and bilateral fallopian tubes and ovaries   DISPOSITION OF SPECIMEN:  PATHOLOGY  COUNTS:  YES  TOURNIQUET:  * No tourniquets in log *  DICTATION: .Dragon Dictation  PLAN OF CARE: Admit to inpatient   PATIENT DISPOSITION:  PACU - hemodynamically stable.   Delay start of Pharmacological VTE agent (>24 hrs) due to surgical blood loss or risk of bleeding: not applicable  Indication: This is a 52 year old G1P1  With abnormal uterine bleeding due to  uterine fibroids. She desires definitive therapy.    Finding. Large fibroid uterus. Adhesions of the bladder to the lower uterine segment. Simple cyst 2 cm in size on the right ovary. Normal appearing left ovary and fallopian tube.   Procedure: The  patient was taken to the operating room where she was placed under general anesthesia. She was placed in dorsal lithotomy position and prepped and draped in the usual sterile fashion. A weighted speculum was placed into the vagina.  A Deaver was placed anteriorly for retraction. The anterior lip of the cervix was grasped  with a single-tooth tenaculum. The vaginal mucosa was injected with 2.5 cc of ropivacaine at the 2/4/ 8 and 10 o'clock  positions. The uterus was sounded to 8 cm the cervix was dilated to 6 mm . 0 vicryl suture  placed at the 12 and 6:00 positions  Of the cervix to facilitate placement of a Rumi  uterine  manipulator. The manipulator was placed without difficulty. Weighted speculum and Deaver were removed .  Attention was turned to the patient's abdomen where a  12 mm skin incision was made 4 cm above the umbilicus..  A 12 mm trocar was placed under direct visualization . The pneumoperitoneum  was achieved with PCO2 gas.  The laparoscope was removed. 60 cc of ropivacaine were injected into the abdominal cavity. The laparoscope  was reinserted. An  8mm incision was made in the right upper quadrant and an 8 mm   trocar was placed 8 centimeters from the umbilicus.later connected to robotic arm #1). An incision was made in there  left upper quadrant TROCAR WAS PLACED 16 cm from the umbilicus. Later connected to robotic arm #2.  Attention was turned to the left upper quadrant where a 11 mm midclavicular assistant  trocar was placed. An  8 mm skin incision was made in the right lateral abdomen and 8 mm  lateral trocar was placed under direct visualization and later connected to robotic arm #3.   ( All incision sites were injected with 10cc of ropivacaine prior to port placement. )  Once all ports had been placed under direct visualization.The laparoscope was removed and the [employer] robotic  system was thin right-sided docked.  The robotic  arms were connected to the corresponding trocars as listed above. The laparoscope  was then reinserted.  The PK bipolar cautery was placed into port #1. The monopolar scissor placed in the port #2. Pro grasp was placed in Midway 3.  All instruments were directed into the pelvis under direct visualization.  Attention  was turned to the surgeons console.. The left  Ureter was  identified. The left infundibulopelvic ligament  was cauterized with PK and excised with scissors. The broad ligament was cauterized with PK incised with scissors. The round ligament was cauterized with the PK incised with scissors. The anterior leaf of broad ligament was incised along the bladder reflection to the midline.  The right  Ureter was identified. The right  infundibulopelvic  ligament was cauterized with PK and excised with scissors. The right broad ligament was cauterized with PK excised scissors. The right round ligament was cauterized with PK and excised with scissors. The   broad ligament was incised  to the midline. A cystotomy in the bladder was visualized at this point and repaired with 2-0 vicryl in running locked fashion . A second layer of 2-0 vicryl was used to imbricate the incision. It was difficult to totally visualize the bladder reflection due to the fundal and anterior fibroid. At this point I decided to convert to open abdominal hysterectomy.    All ports were removed under direct  Visualization. The  pneumoperitoneum was released.  The fascia of the 12 mm umbilical port was closed with 0 Vicryl and  the fascia the 11 mm port was closed with 0 Vicryl. The skin incisions were closed with 4-0 Vicryl and then covered with Derma bond.   A pfannenstiel incision was made with the scalpel and carried down to the underlying layer of fascia. The fascia was incised in the midline and extended laterally with mayo scissors. The posterior aspect of the fascial incision was grasped and the underlying rectus muscles dissected off. This was repeated on the anterior aspect of the fascial incision. The peritoneum was tented up and entered sharply. The bowel was packed with moist laparotomy sponges and a balfour retractor was placed.  A cork screw  Retractor was placed into the fundus of the uterus and used for retraction. The bladder reflection was identified and dissected off of the lower uterine  segment with sharp and blunt dissection.  The uterine arteries were skeletonized bilaterally. Clamped with haney clamps transected and suture li gated. . The cardinal ligaments were clamped bilaterally transected and suture li gated with 0 vicryl. The uterosacral ligaments were clamped bilaterally transected and suture li gated with 0 vicryl. The uterus cervix and bilaterally fallopian tubes and ovaries were then completely excised and sent to pathology . Specimen weighed 488 grams. Vaginal cuff angle were closed with 0 vicryl. The vaginal cuff was closed with interrupted sutures of 0 vicryl.   The bladder was filled in a retrograde fashion with methylene blue and sterile water. No defect was noted.  Cystoscopy was performed with 30 degree scope. Indigo carmine was expressed from both ureteral orifices. No bladder defect was noted.  The cystoscope was removed and the foley catheter was reinserted.  Attention was turned to the abdomen which was irrigated with normal saline. Excellent hemostasis was noted. All sponges and instruments were removed from the abdomen. The peritoneum was closed with 2-0 vicryl .  The fascia was closed with 0 pds in a running fashion . The  subcutaneous adipose layer was re approximated with 2-0 vicryl. The skin was closed with 4-0 vicryl.   Sponge lap and needle counts were correct x2.  The patient was awakened from anesthesia and taken to the recovery room in stable condition.

## 2012-12-19 NOTE — Anesthesia Preprocedure Evaluation (Signed)
Anesthesia Evaluation  Patient identified by MRN, date of birth, ID band Patient awake    Reviewed: Allergy & Precautions, H&P , NPO status , Patient's Chart, lab work & pertinent test results  Airway Mallampati: I TM Distance: >3 FB Neck ROM: full    Dental No notable dental hx. (+) Teeth Intact   Pulmonary neg pulmonary ROS,    Pulmonary exam normal       Cardiovascular     Neuro/Psych negative neurological ROS  negative psych ROS   GI/Hepatic Neg liver ROS,   Endo/Other  negative endocrine ROS  Renal/GU negative Renal ROS  negative genitourinary   Musculoskeletal   Abdominal Normal abdominal exam  (+)   Peds negative pediatric ROS (+)  Hematology negative hematology ROS (+)   Anesthesia Other Findings   Reproductive/Obstetrics negative OB ROS                           Anesthesia Physical Anesthesia Plan  ASA: II  Anesthesia Plan: General   Post-op Pain Management:    Induction: Intravenous  Airway Management Planned: Oral ETT  Additional Equipment:   Intra-op Plan:   Post-operative Plan: Extubation in OR  Informed Consent: I have reviewed the patients History and Physical, chart, labs and discussed the procedure including the risks, benefits and alternatives for the proposed anesthesia with the patient or authorized representative who has indicated his/her understanding and acceptance.   Dental Advisory Given  Plan Discussed with: CRNA and Surgeon  Anesthesia Plan Comments:         Anesthesia Quick Evaluation

## 2012-12-19 NOTE — Anesthesia Postprocedure Evaluation (Signed)
Anesthesia Post Note  Patient: Kim Braun  Procedure(s) Performed: Procedure(s) (LRB): ROBOTIC ASSISTED TOTAL HYSTERECTOMY (N/A) BILATERAL SALPINGECTOMY (Bilateral) HYSTERECTOMY ABDOMINAL (N/A) CYSTOSCOPY (N/A)  Anesthesia type: GA  Patient location: PACU  Post pain: Pain level controlled  Post assessment: Post-op Vital signs reviewed  Last Vitals:  Filed Vitals:   12/19/12 1800  BP: 124/55  Pulse: 67  Temp:   Resp: 18    Post vital signs: Reviewed  Level of consciousness: sedated  Complications: No apparent anesthesia complications

## 2012-12-19 NOTE — Transfer of Care (Signed)
Immediate Anesthesia Transfer of Care Note  Patient: Kim Braun  Procedure(s) Performed: Procedure(s) (LRB) with comments: ROBOTIC ASSISTED TOTAL HYSTERECTOMY (N/A) - Attempted Robotic Total Hysterectomy BILATERAL SALPINGECTOMY (Bilateral) HYSTERECTOMY ABDOMINAL (N/A) - Incision @ 1517 CYSTOSCOPY (N/A)  Patient Location: PACU  Anesthesia Type:General  Level of Consciousness: awake  Airway & Oxygen Therapy: Patient Spontanous Breathing  Post-op Assessment: Report given to PACU RN  Post vital signs: stable  Filed Vitals:   12/19/12 1124  BP: 155/84  Pulse: 94  Temp: 36.6 C  Resp: 18    Complications: No apparent anesthesia complications

## 2012-12-19 NOTE — H&P (Signed)
Date of Initial H&P: 11/27/2012  History reviewed, patient examined, no change in status, stable for surgery.  Pt with LBBB noted at Preop visit. She has been seen by Dr. Donato Schultz with [facility] and has been cleared for surgery.

## 2012-12-20 ENCOUNTER — Encounter (HOSPITAL_COMMUNITY): Payer: Self-pay | Admitting: Obstetrics and Gynecology

## 2012-12-20 LAB — BASIC METABOLIC PANEL
BUN: 4 mg/dL — ABNORMAL LOW (ref 6–23)
CO2: 26 mEq/L (ref 19–32)
Calcium: 8.8 mg/dL (ref 8.4–10.5)
Chloride: 100 mEq/L (ref 96–112)
Creatinine, Ser: 0.47 mg/dL — ABNORMAL LOW (ref 0.50–1.10)
GFR calc Af Amer: >90 mL/min (ref >90)
GFR calc non Af Amer: >90 mL/min (ref >90)
Glucose, Bld: 125 mg/dL — ABNORMAL HIGH (ref 70–99)
Potassium: 4.4 mEq/L (ref 3.5–5.1)
Sodium: 134 mEq/L — ABNORMAL LOW (ref 135–145)

## 2012-12-20 LAB — CBC
HCT: 36.7 % (ref 36.0–46.0)
Hemoglobin: 12.3 g/dL (ref 12.0–15.0)
MCH: 31.3 pg (ref 26.0–34.0)
MCHC: 33.5 g/dL (ref 30.0–36.0)
MCV: 93.4 fL (ref 78.0–100.0)
Platelets: 227 10*3/uL (ref 150–400)
RBC: 3.93 MIL/uL (ref 3.87–5.11)
RDW: 14.1 % (ref 11.5–15.5)
WBC: 9.9 10*3/uL (ref 4.0–10.5)

## 2012-12-20 MED ORDER — NICOTINE 14 MG/24HR TD PT24
14.0000 mg | MEDICATED_PATCH | Freq: Every day | TRANSDERMAL | Status: DC
  Administered 2012-12-20 – 2012-12-21 (×2): 14 mg via TRANSDERMAL
  Filled 2012-12-20 (×2): qty 1

## 2012-12-20 MED ORDER — DOCUSATE SODIUM 100 MG PO CAPS
100.0000 mg | ORAL_CAPSULE | Freq: Two times a day (BID) | ORAL | Status: DC
  Administered 2012-12-20 – 2012-12-21 (×3): 100 mg via ORAL
  Filled 2012-12-20 (×3): qty 1

## 2012-12-20 NOTE — Progress Notes (Signed)
Subjective: Patient reports tolerating PO.  Pain well controlled. No flatus yet.  Objective: I have reviewed patient's vital signs, intake and output, medications and labs.  General: alert and cooperative Cardio: regular rate and rhythm, S1, S2 normal, no murmur, click, rub or gallop GI: soft, non-tender; bowel sounds normal; no masses,  no organomegaly Extremities: extremities normal, atraumatic, no cyanosis or edema Incisions well approximated. Bandage clear dry and intact.   Assessment/Plan: Pod#1 s/p robotic assisted laparscopic hysterectomy with conversion to abdominal hysterectomy and cystotomy repair.  Doing well. Pain controlled. Change triple  Lumen foley to single lumen. Teach patient how to use leg bag.  Foley to be removed in 10 days.  Saline lock iv Colace stool softener Nicotine patch.   LOS: 1 day    Vanita Cannell J. 12/20/2012, 8:14 AM

## 2012-12-20 NOTE — Anesthesia Postprocedure Evaluation (Signed)
  Anesthesia Post-op Note  Patient: Kim Braun  Procedure(s) Performed: Procedure(s) (LRB) with comments: ROBOTIC ASSISTED TOTAL HYSTERECTOMY (N/A) - Attempted Robotic Total Hysterectomy BILATERAL SALPINGECTOMY (Bilateral) HYSTERECTOMY ABDOMINAL (N/A) - Incision @ 1517 CYSTOSCOPY (N/A)  Patient Location: [facility]  Anesthesia Type:General  Level of Consciousness: awake  Airway and Oxygen Therapy: Patient Spontanous Breathing  Post-op Pain: none  Post-op Assessment: Patient's Cardiovascular Status Stable, Respiratory Function Stable, Patent Airway, No signs of Nausea or vomiting, Adequate PO intake, Pain level controlled, No headache, No backache, No residual numbness and No residual motor weakness  Post-op Vital Signs: Reviewed and stable  Complications: No apparent anesthesia complications

## 2012-12-20 NOTE — Progress Notes (Signed)
UR completed 

## 2012-12-20 NOTE — Addendum Note (Signed)
Addendum  created 12/20/12 0818 by Suella Grove, CRNA   Modules edited:Notes Section

## 2012-12-21 MED ORDER — TRAMADOL HCL 50 MG PO TABS
50.0000 mg | ORAL_TABLET | Freq: Four times a day (QID) | ORAL | Status: DC | PRN
  Administered 2012-12-21: 50 mg via ORAL
  Filled 2012-12-21: qty 1

## 2012-12-21 MED ORDER — CIPROFLOXACIN HCL 500 MG PO TABS: 500.0000 mg | ORAL_TABLET | Freq: Two times a day (BID) | ORAL | Status: DC

## 2012-12-21 MED ORDER — IBUPROFEN 800 MG PO TABS: 800.0000 mg | ORAL_TABLET | Freq: Three times a day (TID) | ORAL | Status: DC | PRN

## 2012-12-21 MED ORDER — TRAMADOL HCL 50 MG PO TABS: 50.0000 mg | ORAL_TABLET | Freq: Four times a day (QID) | ORAL | Status: DC | PRN

## 2012-12-21 MED ORDER — NICOTINE 14 MG/24HR TD PT24: 1.0000 | MEDICATED_PATCH | Freq: Every day | TRANSDERMAL | Status: DC

## 2012-12-21 NOTE — Discharge Summary (Signed)
Physician Discharge Summary  Patient ID: Kim Braun MRN: [id number] DOB/AGE: 1961/09/10 52 y.o.  Admit date: 12/19/2012 Discharge date: 12/21/2012  Admission Diagnoses:  Discharge Diagnoses: S/p failed robotic hysterectomy, TAH/BSO, incidental cystotomy with repair, cystoscopy Active Problems:  Fibroids   Discharged Condition: good  Hospital Course: Pt with uncomplicated post op course.  Foley remained in place without issue.  Percocet called twitching and itching so it was discontinued and Ultram started.  Consults: None  Significant Diagnostic Studies: labs: Hemoglobin 14 to 12.3.  Cr normal post op.  Treatments: surgery: see above and op note.  Discharge Exam: Blood pressure 139/90, pulse 89, temperature 97.1 F (36.2 C), temperature source Oral, resp. rate 18, height 5\' 3"  (1.6 m), weight 54.432 kg (120 lb), last menstrual period 12/03/2012, SpO2 95.00%. wnl day of discharge  Disposition: 01-Home or Self Care  Discharge Orders    Future Orders Please Complete By Expires   Diet - low sodium heart healthy      Increase activity slowly      Sexual Activity Restrictions      Comments:   Pelvic rest x 6 weeks.   Lifting restrictions      Comments:   No lifting greater than 15 pounds.   Discharge wound care:      Comments:   Remove honeycomb dressing tomorrow.  Keep incision clean and dry.   Call MD for:  temperature >100.4      Call MD for:  persistant nausea and vomiting      Call MD for:  severe uncontrolled pain      Call MD for:  redness, tenderness, or signs of infection (pain, swelling, redness, odor or green/yellow discharge around incision site)      Call MD for:  persistant dizziness or light-headedness          Medication List     As of 12/21/2012  8:24 AM    TAKE these medications         amLODipine 5 MG tablet   Commonly known as: NORVASC   Take 5 mg by mouth daily.      aspirin 325 MG EC tablet   Take 325 mg by mouth daily.      CIMZIA 2  X 200 MG Kit   Generic drug: Certolizumab Pegol   Inject 200 mg into the skin every 14 (fourteen) days.      ciprofloxacin 500 MG tablet   Commonly known as: CIPRO   Take 1 tablet (500 mg total) by mouth 2 (two) times daily.      ibuprofen 800 MG tablet   Commonly known as: ADVIL,MOTRIN   Take 1 tablet (800 mg total) by mouth every 8 (eight) hours as needed (mild pain).      nicotine 14 mg/24hr patch   Commonly known as: NICODERM CQ - dosed in mg/24 hours   Place 1 patch onto the skin daily.      potassium chloride 20 MEQ packet   Commonly known as: KLOR-CON   Take 20 mEq by mouth daily.      traMADol 50 MG tablet   Commonly known as: ULTRAM   Take 1 tablet (50 mg total) by mouth every 6 (six) hours as needed.      Vitamin D2 2000 UNITS Tabs   Take 2,000 Units by mouth daily.      Uragesic Blue called in to pharmacy.      Follow-up Information    Follow up with Jessee Avers., MD.  On 12/28/2012. (Catheter removal)    Contact information:   [address] [place] [address] [phone]          Signed: Geryl Rankins 12/21/2012, 8:24 AM

## 2012-12-21 NOTE — Progress Notes (Signed)
2 Days Post-Op Procedure(s) (LRB): ROBOTIC ASSISTED TOTAL HYSTERECTOMY (N/A) BILATERAL SALPINGECTOMY (Bilateral) HYSTERECTOMY ABDOMINAL (N/A) CYSTOSCOPY (N/A)  Subjective: Patient reports tolerating PO.  Pt states she does not want Percocet b/c it made her twitch overnight.  Also caused some itching.  Pt has already filled rx and thinks she already got Percocet and Motrin.  Thinks she had gas pain overnight.  Relieved with ambulation. Questions about surgery, cystotomy and recovery.  Instructed to stop smoking.  Requests nicotine patch. +flatus, pain controlled.  No complaints regarding foley.  Objective: I have reviewed patient's vital signs, intake and output and labs.  General: alert, cooperative and no distress GI: soft, non-tender; bowel sounds normal; no masses,  no organomegaly Extremities: Homans sign is negative, no sign of DVT and no edema, redness or tenderness in the calves or thighs Incisions healing well.  Honeycomb dressing still in place over suture line.  Unable to visualize incision. Abdomen slightly distended by BS are active.  Assessment: s/p Procedure(s) (LRB) with comments: ROBOTIC ASSISTED TOTAL HYSTERECTOMY (N/A) - Attempted Robotic Total Hysterectomy BILATERAL SALPINGECTOMY (Bilateral) HYSTERECTOMY ABDOMINAL (N/A) - Incision @ 1517 CYSTOSCOPY (N/A): progressing well  Plan: Discharge home Pt' son coming in 4 hours.  Will get leg bag in place, discontinue Percocet and start Ultram while waiting for discharge. Per Dr. Richardson Dopp, d/c home on Cipro 500 mg once daily and Uragesic Blue one tablet every 6 hours prn.  Discussed rationale with pt.  Foley care to be reviewed prior to discharge and instructions were given. Pt to see Dr. Richardson Dopp in 1 week for removal of foley.   LOS: 2 days    Kim Braun 12/21/2012, 8:16 AM

## 2013-02-17 ENCOUNTER — Inpatient Hospital Stay (HOSPITAL_COMMUNITY)
Admission: AD | Admit: 2013-02-17 | Discharge: 2013-02-17 | Disposition: A | Discharge status: Home / Self Care | Payer: 59 | Source: Ambulatory Visit | Attending: Obstetrics and Gynecology | Admitting: Obstetrics and Gynecology

## 2013-02-17 ENCOUNTER — Inpatient Hospital Stay (HOSPITAL_COMMUNITY): Payer: 59

## 2013-02-17 ENCOUNTER — Encounter (HOSPITAL_COMMUNITY): Payer: Self-pay | Admitting: Family

## 2013-02-17 DIAGNOSIS — R1031 Right lower quadrant pain: Secondary | ICD-10-CM | POA: Insufficient documentation

## 2013-02-17 DIAGNOSIS — N898 Other specified noninflammatory disorders of vagina: Secondary | ICD-10-CM

## 2013-02-17 DIAGNOSIS — R109 Unspecified abdominal pain: Secondary | ICD-10-CM

## 2013-02-17 DIAGNOSIS — N939 Abnormal uterine and vaginal bleeding, unspecified: Secondary | ICD-10-CM

## 2013-02-17 DIAGNOSIS — Z90710 Acquired absence of both cervix and uterus: Secondary | ICD-10-CM | POA: Insufficient documentation

## 2013-02-17 LAB — CBC WITH DIFFERENTIAL/PLATELET
Basophils Absolute: 0.1 10*3/uL (ref 0.0–0.1)
Basophils Relative: 1 % (ref 0–1)
Eosinophils Absolute: 0.1 10*3/uL (ref 0.0–0.7)
Eosinophils Relative: 1 % (ref 0–5)
HCT: 40.3 % (ref 36.0–46.0)
Hemoglobin: 14 g/dL (ref 12.0–15.0)
Lymphocytes Relative: 19 % (ref 12–46)
Lymphs Abs: 1.7 10*3/uL (ref 0.7–4.0)
MCH: 31.9 pg (ref 26.0–34.0)
MCHC: 34.7 g/dL (ref 30.0–36.0)
MCV: 91.8 fL (ref 78.0–100.0)
Monocytes Absolute: 1.1 10*3/uL — ABNORMAL HIGH (ref 0.1–1.0)
Monocytes Relative: 12 % (ref 3–12)
Neutro Abs: 6 10*3/uL (ref 1.7–7.7)
Neutrophils Relative %: 67 % (ref 43–77)
Platelets: 302 10*3/uL (ref 150–400)
RBC: 4.39 MIL/uL (ref 3.87–5.11)
RDW: 14 % (ref 11.5–15.5)
WBC: 8.9 10*3/uL (ref 4.0–10.5)

## 2013-02-17 LAB — URINE MICROSCOPIC-ADD ON

## 2013-02-17 LAB — URINALYSIS, ROUTINE W REFLEX MICROSCOPIC
Bilirubin Urine: NEGATIVE
Glucose, UA: NEGATIVE mg/dL
Ketones, ur: NEGATIVE mg/dL
Leukocytes, UA: NEGATIVE
Nitrite: NEGATIVE
Protein, ur: NEGATIVE mg/dL
Specific Gravity, Urine: 1.01 (ref 1.005–1.030)
Urobilinogen, UA: 0.2 mg/dL (ref 0.0–1.0)
pH: 5.5 (ref 5.0–8.0)

## 2013-02-17 LAB — CREATININE, SERUM
Creatinine, Ser: 0.49 mg/dL — ABNORMAL LOW (ref 0.50–1.10)
GFR calc Af Amer: >90 mL/min (ref >90)
GFR calc non Af Amer: >90 mL/min (ref >90)

## 2013-02-17 MED ORDER — SODIUM CHLORIDE 0.9 % IJ SOLN
INTRAMUSCULAR | Status: AC
  Filled 2013-02-17: qty 3

## 2013-02-17 MED ORDER — IOHEXOL 300 MG/ML  SOLN
50.0000 mL | Freq: Once | INTRAMUSCULAR | Status: AC | PRN
  Administered 2013-02-17: 1 mL via ORAL

## 2013-02-17 MED ORDER — IOHEXOL 300 MG/ML  SOLN
100.0000 mL | Freq: Once | INTRAMUSCULAR | Status: AC | PRN
  Administered 2013-02-17: 100 mL via INTRAVENOUS

## 2013-02-17 NOTE — MAU Note (Signed)
Patient presents to MAU with c/o lower abdominal pain and bleeding after intercourse today. Reports this was second time she had intercourse since her hysterectomy on 1/21. First intercourse was two weeks ago and she experienced no pain, only bleeding which subsided after about 5 hours. Denies fever, chills.

## 2013-02-17 NOTE — MAU Note (Signed)
Pt reports she was having intercorse with her husband. Afterwards she started having severe abd pain that has not gone away. Had some vaginal bleeding as well. Pt had hysterectomy in January 2014.

## 2013-02-17 NOTE — MAU Provider Note (Signed)
History     CSN: [id number]  Arrival date and time: 02/17/13 4098   First Provider Initiated Contact with Patient 02/17/13 1927      Chief Complaint  Patient presents with  . Abdominal Pain   HPI Kim Braun 52 y.o. Comes to MAU with sudden onset of severe abdominal pain and bleeding that began during intercourse today at 5 pm.  Significant history of total abdominal hysterectomy on 12-19-12.  Has had some RLQ pain since having the surgery.  Today was severe at 5 pm.  Currently now is 3/10.   Past Medical History  Diagnosis Date  . Hypertension   . Rheumatoid arthritis   . Smoker     Past Surgical History  Procedure Laterality Date  . Cesarean section    . Colon surgery  age 89  . Robotic assisted total hysterectomy  12/19/2012    Procedure: ROBOTIC ASSISTED TOTAL HYSTERECTOMY;  Surgeon: Dorien Chihuahua. Richardson Dopp, MD;  Location: [facility];  Service: Gynecology;  Laterality: N/A;  Attempted Robotic Total Hysterectomy  . Bilateral salpingectomy  12/19/2012    Procedure: BILATERAL SALPINGECTOMY;  Surgeon: Dorien Chihuahua. Richardson Dopp, MD;  Location: [facility];  Service: Gynecology;  Laterality: Bilateral;  . Abdominal hysterectomy  12/19/2012    Procedure: HYSTERECTOMY ABDOMINAL;  Surgeon: Dorien Chihuahua. Richardson Dopp, MD;  Location: [facility];  Service: Gynecology;  Laterality: N/A;  Incision @ 1517  . Cystoscopy  12/19/2012    Procedure: CYSTOSCOPY;  Surgeon: Dorien Chihuahua. Richardson Dopp, MD;  Location: [facility];  Service: Gynecology;  Laterality: N/A;    History reviewed. No pertinent family history.  History  Substance Use Topics  . Smoking status: Current Every Day Smoker -- 1.00 packs/day for 40 years    Types: Cigarettes  . Smokeless tobacco: Not on file  . Alcohol Use: 0.0 oz/week    1-2 Cans of beer per week    Allergies: No Known Allergies  Prescriptions prior to admission  Medication Sig Dispense Refill  . amLODipine (NORVASC) 5 MG tablet Take 5 mg by mouth daily.      Marland Kitchen aspirin 325 MG EC tablet Take 325 mg by mouth daily.       Marland Kitchen BIOTIN PO Take 1 tablet by mouth daily.      . Certolizumab Pegol (CIMZIA) 2 X 200 MG KIT Inject 200 mg into the skin every 14 (fourteen) days.       . Ergocalciferol (VITAMIN D2) 2000 UNITS TABS Take 2,000 Units by mouth daily.      . potassium chloride (KLOR-CON) 20 MEQ packet Take 20 mEq by mouth daily.        Review of Systems  Constitutional: Negative for fever.  Gastrointestinal: Positive for abdominal pain and diarrhea.  Genitourinary: Negative for dysuria.       Vaginal bleeding began with intercourse.   Physical Exam   Blood pressure 131/79, pulse 88, temperature 97.7 F (36.5 C), temperature source Oral, resp. rate 18, height 5\' 3"  (1.6 m), weight 123 lb 3 oz (55.877 kg), last menstrual period 12/03/2012.  Physical Exam  Nursing note and vitals reviewed. Constitutional: She is oriented to person, place, and time. She appears well-developed and well-nourished.  HENT:  Head: Normocephalic.  Eyes: EOM are normal.  Neck: Neck supple.  GI: There is tenderness. There is no rebound and no guarding.  Tender in the middle of the RLQ  Genitourinary:  Speculum exam: Vagina - Small amount of watery light red blood, posterior vagina has round opening with darker tissue  seen behind the vaginal wall.  Bleeding seems to be coming from this area. Bimanual exam: Vaginal wall cuff tender, FT in opening size, tender to palpation Uterus Surgically absent Adnexa Ovaries surgically absent Watery light red blood streams down perineum as speculum removed. Chaperone present for exam.  Musculoskeletal: Normal range of motion.  Neurological: She is alert and oriented to person, place, and time.  Skin: Skin is warm and dry.  Psychiatric: She has a normal mood and affect.   Results for orders placed during the hospital encounter of 02/17/13 (from the past 24 hour(s))  URINALYSIS, ROUTINE W REFLEX MICROSCOPIC     Status: Abnormal   Collection Time    02/17/13  6:45 PM      Result Value  Range   Color, Urine YELLOW  YELLOW   APPearance CLEAR  CLEAR   Specific Gravity, Urine 1.010  1.005 - 1.030   pH 5.5  5.0 - 8.0   Glucose, UA NEGATIVE  NEGATIVE mg/dL   Hgb urine dipstick LARGE (*) NEGATIVE   Bilirubin Urine NEGATIVE  NEGATIVE   Ketones, ur NEGATIVE  NEGATIVE mg/dL   Protein, ur NEGATIVE  NEGATIVE mg/dL   Urobilinogen, UA 0.2  0.0 - 1.0 mg/dL   Nitrite NEGATIVE  NEGATIVE   Leukocytes, UA NEGATIVE  NEGATIVE  URINE MICROSCOPIC-ADD ON     Status: Abnormal   Collection Time    02/17/13  6:45 PM      Result Value Range   Squamous Epithelial / LPF RARE  RARE   WBC, UA 0-2  <3 WBC/hpf   RBC / HPF 21-50  <3 RBC/hpf   Bacteria, UA RARE  RARE   Casts HYALINE CASTS (*) NEGATIVE  CBC WITH DIFFERENTIAL     Status: Abnormal   Collection Time    02/17/13  8:11 PM      Result Value Range   WBC 8.9  4.0 - 10.5 K/uL   RBC 4.39  3.87 - 5.11 MIL/uL   Hemoglobin 14.0  12.0 - 15.0 g/dL   HCT 16.1  09.6 - 04.5 %   MCV 91.8  78.0 - 100.0 fL   MCH 31.9  26.0 - 34.0 pg   MCHC 34.7  30.0 - 36.0 g/dL   RDW 40.9  81.1 - 91.4 %   Platelets 302  150 - 400 K/uL   Neutrophils Relative 67  43 - 77 %   Neutro Abs 6.0  1.7 - 7.7 K/uL   Lymphocytes Relative 19  12 - 46 %   Lymphs Abs 1.7  0.7 - 4.0 K/uL   Monocytes Relative 12  3 - 12 %   Monocytes Absolute 1.1 (*) 0.1 - 1.0 K/uL   Eosinophils Relative 1  0 - 5 %   Eosinophils Absolute 0.1  0.0 - 0.7 K/uL   Basophils Relative 1  0 - 1 %   Basophils Absolute 0.1  0.0 - 0.1 K/uL    MAU Course  Procedures  MDM 1947  Consult with Dr. Dion Body re: plan of care, will check labs and have CT of abd and pelvis   2020  Care assumed by Mayer Camel, NP Assessment and Plan   BURLESON,TERRI 02/17/2013, 7:39 PM   Patent reports having first episode of vaginal bleeding on 3/14 after sexual intercourse. The bleeding only lasted about 5 hours. No bleeding since until today. Today during intercourse had sudden severe abdominal pain and then light  vaginal bleeding.   Patient awaiting CT.  Ct Abdomen  Pelvis W Contrast  02/17/2013  *RADIOLOGY REPORT*  Clinical Data: Status post hysterectomy with severe vaginal plain and bleeding.  CT ABDOMEN AND PELVIS WITH CONTRAST  Technique:  Multidetector CT imaging of the abdomen and pelvis was performed following the standard protocol during bolus administration of intravenous contrast.  Contrast: OMNIPAQUE IOHEXOL 300 MG/ML  SOLN  Comparison: None.  Findings: The lung bases are clear except for dependent atelectasis.  No pleural effusion.  The liver demonstrates mild diffuse fatty infiltration.  There is a vascular lesion in the left hepatic lobe which could be a venous varix or small AVM.  The portal and hepatic veins are patent.  The gallbladder demonstrates a gallstone or polyp.  No common bile duct dilatation.  The pancreas is normal.  The spleen is normal.  The adrenal glands and kidneys are normal except for a left renal cyst.  The stomach, duodenum, small bowel and colon are unremarkable.  No inflammatory changes or mass lesions.  No mesenteric or retroperitoneal mass or adenopathy.  The aorta is normal in caliber.  Scattered atherosclerotic calcifications.  Examination of the pelvis demonstrates surgically absent uterus. The bladder appears normal.  The vagina is grossly normal.  Do not see an obvious pelvic hematoma or abscess.  The bony structures are intact.  IMPRESSION:  1.  No acute abdominal/pelvic findings, mass lesions, adenopathy or abscess. 2.  Status post hysterectomy.  No significant pelvic abnormalities are demonstrated.   Original Report Authenticated By: Rudie Meyer, M.D.    22:43 Dr. Dion Body notified of results and will d/c patient home to follow up in the office tomorrow.   Assessment: 52 y.o. female s/p hysterectomy with abdominal pain during intercourse   Vaginal bleeding  Plan:  Pelvic rest, follow up in the office tomorrow, return here as needed.  I have reviewed this  patient's vital signs, nurses notes, appropriate labs and imaging.  I have discussed findings in detail with the patient and plan of care. Patient voices understanding.

## 2013-02-19 NOTE — MAU Provider Note (Signed)
Reviewed note.  Pt seen in office within 24 hours of discharge from MAU.

## 2013-03-14 ENCOUNTER — Emergency Department (HOSPITAL_COMMUNITY)
Admission: EM | Admit: 2013-03-14 | Discharge: 2013-03-14 | Disposition: A | Discharge status: Home / Self Care | Payer: 59 | Attending: Emergency Medicine | Admitting: Emergency Medicine

## 2013-03-14 ENCOUNTER — Emergency Department (HOSPITAL_COMMUNITY): Payer: 59

## 2013-03-14 ENCOUNTER — Encounter (HOSPITAL_COMMUNITY): Payer: Self-pay | Admitting: Emergency Medicine

## 2013-03-14 DIAGNOSIS — I10 Essential (primary) hypertension: Secondary | ICD-10-CM

## 2013-03-14 DIAGNOSIS — M069 Rheumatoid arthritis, unspecified: Secondary | ICD-10-CM | POA: Insufficient documentation

## 2013-03-14 DIAGNOSIS — R209 Unspecified disturbances of skin sensation: Secondary | ICD-10-CM | POA: Insufficient documentation

## 2013-03-14 DIAGNOSIS — R0602 Shortness of breath: Secondary | ICD-10-CM | POA: Insufficient documentation

## 2013-03-14 DIAGNOSIS — Z79899 Other long term (current) drug therapy: Secondary | ICD-10-CM | POA: Insufficient documentation

## 2013-03-14 DIAGNOSIS — Z7982 Long term (current) use of aspirin: Secondary | ICD-10-CM | POA: Insufficient documentation

## 2013-03-14 DIAGNOSIS — F17200 Nicotine dependence, unspecified, uncomplicated: Secondary | ICD-10-CM | POA: Insufficient documentation

## 2013-03-14 DIAGNOSIS — F411 Generalized anxiety disorder: Secondary | ICD-10-CM | POA: Insufficient documentation

## 2013-03-14 DIAGNOSIS — M5412 Radiculopathy, cervical region: Secondary | ICD-10-CM | POA: Insufficient documentation

## 2013-03-14 LAB — CBC WITH DIFFERENTIAL/PLATELET
Basophils Absolute: 0 10*3/uL (ref 0.0–0.1)
Basophils Relative: 1 % (ref 0–1)
Eosinophils Absolute: 0 10*3/uL (ref 0.0–0.7)
Eosinophils Relative: 1 % (ref 0–5)
HCT: 41.6 % (ref 36.0–46.0)
Hemoglobin: 14.9 g/dL (ref 12.0–15.0)
Lymphocytes Relative: 26 % (ref 12–46)
Lymphs Abs: 1.6 10*3/uL (ref 0.7–4.0)
MCH: 31.6 pg (ref 26.0–34.0)
MCHC: 35.8 g/dL (ref 30.0–36.0)
MCV: 88.3 fL (ref 78.0–100.0)
Monocytes Absolute: 0.7 10*3/uL (ref 0.1–1.0)
Monocytes Relative: 11 % (ref 3–12)
Neutro Abs: 3.7 10*3/uL (ref 1.7–7.7)
Neutrophils Relative %: 61 % (ref 43–77)
Platelets: 288 10*3/uL (ref 150–400)
RBC: 4.71 MIL/uL (ref 3.87–5.11)
RDW: 13.6 % (ref 11.5–15.5)
WBC: 6 10*3/uL (ref 4.0–10.5)

## 2013-03-14 LAB — TROPONIN I
Troponin I: <0.3 ng/mL (ref <0.30)
Troponin I: <0.3 ng/mL (ref <0.30)

## 2013-03-14 LAB — COMPREHENSIVE METABOLIC PANEL
ALT: 48 U/L — ABNORMAL HIGH (ref 0–35)
AST: 50 U/L — ABNORMAL HIGH (ref 0–37)
Albumin: 3.7 g/dL (ref 3.5–5.2)
Alkaline Phosphatase: 74 U/L (ref 39–117)
BUN: 8 mg/dL (ref 6–23)
CO2: 24 mEq/L (ref 19–32)
Calcium: 9.6 mg/dL (ref 8.4–10.5)
Chloride: 103 mEq/L (ref 96–112)
Creatinine, Ser: 0.45 mg/dL — ABNORMAL LOW (ref 0.50–1.10)
GFR calc Af Amer: >90 mL/min (ref >90)
GFR calc non Af Amer: >90 mL/min (ref >90)
Glucose, Bld: 117 mg/dL — ABNORMAL HIGH (ref 70–99)
Potassium: 3.1 mEq/L — ABNORMAL LOW (ref 3.5–5.1)
Sodium: 138 mEq/L (ref 135–145)
Total Bilirubin: 0.6 mg/dL (ref 0.3–1.2)
Total Protein: 7.1 g/dL (ref 6.0–8.3)

## 2013-03-14 LAB — D-DIMER, QUANTITATIVE (NOT AT ARMC): D-Dimer, Quant: 0.32 ug/mL-FEU (ref 0.00–0.48)

## 2013-03-14 MED ORDER — METHYLPREDNISOLONE 4 MG PO KIT: PACK | ORAL | Status: DC

## 2013-03-14 MED ORDER — HYDROCODONE-ACETAMINOPHEN 5-325 MG PO TABS: 2.0000 | ORAL_TABLET | ORAL | Status: DC | PRN

## 2013-03-14 MED ORDER — IBUPROFEN 800 MG PO TABS: 800.0000 mg | ORAL_TABLET | Freq: Three times a day (TID) | ORAL | Status: DC

## 2013-03-14 NOTE — ED Notes (Signed)
Pt reports having elevated BP and numbness and tingling to left arm x 5 days. Pt took BP at [employer] [employer] and it was 185/107. Pt denies dizziness or headache, reports feeling weak. Speech clear, no facial droop noted.

## 2013-03-14 NOTE — ED Provider Notes (Signed)
History     CSN: [id number]  Arrival date & time 03/14/13  0910   First MD Initiated Contact with Patient 03/14/13 [phone]      Chief Complaint  Patient presents with  . Hypertension  . Numbness    (Consider location/radiation/quality/duration/timing/severity/associated sxs/prior treatment) HPI Comments: Patient presents to the ED with intermittent tingling and numbness to her left upper and forearm for the past 5 days. He comes and goes lasting a few minutes to hours at a time. Today it is lasted constant 4 hours. She also checked her blood pressure and found elevated to 185/100. She states compliance with her Norvasc and has not missed any doses. She denies any recent medication changes or emesis. No cough, fever, abdominal pain, nausea or vomiting. She states a normal stress test in January 2014 before her hysterectomy surgery. She denies any headache, vision change, weakness, dizziness or lightheadedness. She has mild shortness of breath at rest.  The history is provided by the patient.    Past Medical History  Diagnosis Date  . Hypertension   . Rheumatoid arthritis   . Smoker     Past Surgical History  Procedure Laterality Date  . Cesarean section    . Colon surgery  age 36  . Robotic assisted total hysterectomy  12/19/2012    Procedure: ROBOTIC ASSISTED TOTAL HYSTERECTOMY;  Surgeon: Dorien Chihuahua. Richardson Dopp, MD;  Location: [facility];  Service: Gynecology;  Laterality: N/A;  Attempted Robotic Total Hysterectomy  . Bilateral salpingectomy  12/19/2012    Procedure: BILATERAL SALPINGECTOMY;  Surgeon: Dorien Chihuahua. Richardson Dopp, MD;  Location: [facility];  Service: Gynecology;  Laterality: Bilateral;  . Abdominal hysterectomy  12/19/2012    Procedure: HYSTERECTOMY ABDOMINAL;  Surgeon: Dorien Chihuahua. Richardson Dopp, MD;  Location: [facility];  Service: Gynecology;  Laterality: N/A;  Incision @ 1517  . Cystoscopy  12/19/2012    Procedure: CYSTOSCOPY;  Surgeon: Dorien Chihuahua. Richardson Dopp, MD;  Location: [facility];  Service: Gynecology;  Laterality: N/A;     No family history on file.  History  Substance Use Topics  . Smoking status: Current Every Day Smoker -- 1.00 packs/day for 40 years    Types: Cigarettes  . Smokeless tobacco: Not on file  . Alcohol Use: 0.0 oz/week    1-2 Cans of beer per week    OB History   Grav Para Term Preterm Abortions TAB SAB Ect Mult Living                  Review of Systems  Constitutional: Negative for fever, activity change and appetite change.  HENT: Negative for nosebleeds and congestion.   Respiratory: Positive for shortness of breath. Negative for cough and chest tightness.   Cardiovascular: Negative for chest pain.  Gastrointestinal: Negative for nausea, vomiting and abdominal pain.  Genitourinary: Negative for dysuria and hematuria.  Musculoskeletal: Negative for back pain.  Skin: Negative for rash.  Neurological: Positive for numbness. Negative for dizziness, weakness, light-headedness and headaches.  A complete 10 system review of systems was obtained and all systems are negative except as noted in the HPI and PMH.    Allergies  Review of patient's allergies indicates no known allergies.  Home Medications   Current Outpatient Rx  Name  Route  Sig  Dispense  Refill  . amLODipine (NORVASC) 5 MG tablet   Oral   Take 5 mg by mouth daily.         Marland Kitchen aspirin 325 MG EC tablet   Oral   Take  325 mg by mouth daily.         . Ergocalciferol (VITAMIN D2) 2000 UNITS TABS   Oral   Take 2,000 Units by mouth daily.         . potassium chloride SA (K-DUR,KLOR-CON) 20 MEQ tablet   Oral   Take 20 mEq by mouth 2 (two) times daily.         Marland Kitchen HYDROcodone-acetaminophen (NORCO/VICODIN) 5-325 MG per tablet   Oral   Take 2 tablets by mouth every 4 (four) hours as needed for pain.   10 tablet   0   . ibuprofen (ADVIL,MOTRIN) 800 MG tablet   Oral   Take 1 tablet (800 mg total) by mouth 3 (three) times daily.   21 tablet   0   . methylPREDNISolone (MEDROL, PAK,) 4 MG tablet       follow package directions   21 tablet   0     BP 91/61  Pulse 79  Resp 22  SpO2 96%  LMP 12/03/2012  Physical Exam  Constitutional: She is oriented to person, place, and time. She appears well-developed and well-nourished. No distress.  Anxious appearing  HENT:  Head: Normocephalic and atraumatic.  Mouth/Throat: Oropharynx is clear and moist. No oropharyngeal exudate.  Eyes: Conjunctivae and EOM are normal. Pupils are equal, round, and reactive to light.  Neck: Normal range of motion. Neck supple.  Cardiovascular: Normal rate, regular rhythm and normal heart sounds.   No murmur heard. Pulmonary/Chest: Effort normal and breath sounds normal. No respiratory distress.  Abdominal: Soft. There is no tenderness. There is no rebound and no guarding.  Musculoskeletal: Normal range of motion. She exhibits no edema and no tenderness.  Neurological: She is alert and oriented to person, place, and time. No cranial nerve deficit. She exhibits normal muscle tone. Coordination normal.  Equal grip bilaterally. 5 out of 5 strength throughout, cranial nerves 2-12 intact, no ataxia finger to nose  Skin: Skin is warm.    ED Course  Procedures (including critical care time)  Labs Reviewed  COMPREHENSIVE METABOLIC PANEL - Abnormal; Notable for the following:    Potassium 3.1 (*)    Glucose, Bld 117 (*)    Creatinine, Ser 0.45 (*)    AST 50 (*)    ALT 48 (*)    All other components within normal limits  CBC WITH DIFFERENTIAL  D-DIMER, QUANTITATIVE  TROPONIN I  TROPONIN I   Dg Chest 2 View  03/14/2013  *RADIOLOGY REPORT*  Clinical Data: Shortness of breath with left arm pain and numbness. History of hypertension and smoking.  CHEST - 2 VIEW  Comparison: 05/11/2009.  Findings: The heart size and mediastinal contours are stable.  The lungs are hyperinflated with upper lobe emphysematous changes. There is no edema, confluent airspace opacity or pleural effusion. A mild scoliosis appears stable.  EKG snaps overlie the upper chest bilaterally.  IMPRESSION: Stable chronic obstructive pulmonary disease.  No acute cardiopulmonary process.   Original Report Authenticated By: Carey Bullocks, M.D.    Dg Cervical Spine Complete  03/14/2013  *RADIOLOGY REPORT*  Clinical Data: Left arm pain and numbness.  No acute injury.  CERVICAL SPINE - COMPLETE 4+ VIEW  Comparison: None.  Findings: There is a mild cervicothoracic scoliosis.  The lateral alignment is normal.  The prevertebral soft tissues appear normal. There is mild disc space loss with uncinate spurring at C4-C5 and C5-C6.  The oblique views demonstrate moderate left foraminal stenosis at C4-C5.  There is no other  significant osseous foraminal stenosis.  No acute osseous abnormalities are identified.  IMPRESSION: Cervical spondylosis as described with osseous foraminal narrowing on the left at C4-C5.  This could affect the left C5 nerve root. No acute osseous findings or malalignment.   Original Report Authenticated By: Carey Bullocks, M.D.      1. Cervical radiculopathy   2. Hypertension       MDM  5 days intermittent numbness and tingling in the left arm. No weakness, headache, chest pain.  Patient appears anxious. She has no grip strength weakness. She has no pain to palpation in her neck or shoulder.   Foraminal narrowing of L c4-5 likely responsible for arm tingling.  Will treat for cervical radiculopathy. BP improved to 140s systolic and equal in bilateral upper extremities.   No chest pain.  No shortness of breath. Ddimer negative. Delta troponin negative. Do not suspect ACS or PE.  Do not suspect CVA or TIA.    91/61 Blood pressure is spurious and inaccurate.  BP 130/70 at discharge.  Date: 03/14/2013  Rate: 109  Rhythm: sinus tachycardia  QRS Axis: normal  Intervals: normal  ST/T Wave abnormalities: nonspecific ST/T changes  Conduction Disutrbances:left bundle branch block  Narrative Interpretation:   Old EKG Reviewed:  unchanged   Glynn Octave, MD 03/14/13 1610

## 2013-06-04 ENCOUNTER — Encounter: Payer: Self-pay | Admitting: Obstetrics & Gynecology

## 2013-06-28 ENCOUNTER — Encounter: Payer: Self-pay | Admitting: Obstetrics & Gynecology

## 2013-07-04 ENCOUNTER — Other Ambulatory Visit: Payer: Self-pay | Admitting: Neurosurgery

## 2013-07-11 ENCOUNTER — Encounter (HOSPITAL_COMMUNITY): Payer: Self-pay | Admitting: Pharmacy Technician

## 2013-07-15 ENCOUNTER — Other Ambulatory Visit (HOSPITAL_COMMUNITY): Payer: 59

## 2013-07-15 ENCOUNTER — Encounter (HOSPITAL_COMMUNITY)
Admission: RE | Admit: 2013-07-15 | Discharge: 2013-07-15 | Disposition: A | Discharge status: Home / Self Care | Payer: 59 | Source: Ambulatory Visit | Attending: Neurosurgery | Admitting: Neurosurgery

## 2013-07-15 ENCOUNTER — Encounter (HOSPITAL_COMMUNITY): Payer: Self-pay

## 2013-07-15 HISTORY — DX: Left bundle-branch block, unspecified: I44.7

## 2013-07-15 HISTORY — DX: Cardiac murmur, unspecified: R01.1

## 2013-07-15 HISTORY — DX: Nontoxic single thyroid nodule: E04.1

## 2013-07-15 HISTORY — DX: Gestational diabetes mellitus in pregnancy, unspecified control: O24.419

## 2013-07-15 LAB — CBC
HCT: 41.5 % (ref 36.0–46.0)
Hemoglobin: 15.1 g/dL — ABNORMAL HIGH (ref 12.0–15.0)
MCH: 33 pg (ref 26.0–34.0)
MCHC: 36.4 g/dL — ABNORMAL HIGH (ref 30.0–36.0)
MCV: 90.8 fL (ref 78.0–100.0)
Platelets: 300 10*3/uL (ref 150–400)
RBC: 4.57 MIL/uL (ref 3.87–5.11)
RDW: 13.3 % (ref 11.5–15.5)
WBC: 6.8 10*3/uL (ref 4.0–10.5)

## 2013-07-15 LAB — BASIC METABOLIC PANEL
BUN: 9 mg/dL (ref 6–23)
CO2: 27 mEq/L (ref 19–32)
Calcium: 9.7 mg/dL (ref 8.4–10.5)
Chloride: 98 mEq/L (ref 96–112)
Creatinine, Ser: 0.55 mg/dL (ref 0.50–1.10)
GFR calc Af Amer: >90 mL/min (ref >90)
GFR calc non Af Amer: >90 mL/min (ref >90)
Glucose, Bld: 196 mg/dL — ABNORMAL HIGH (ref 70–99)
Potassium: 3.2 mEq/L — ABNORMAL LOW (ref 3.5–5.1)
Sodium: 135 mEq/L (ref 135–145)

## 2013-07-15 LAB — SURGICAL PCR SCREEN
MRSA, PCR: NEGATIVE
Staphylococcus aureus: NEGATIVE

## 2013-07-15 NOTE — Pre-Procedure Instructions (Addendum)
Kim Braun  07/15/2013   Your procedure is scheduled on:  07/17/13  Report to [facility] [phone].  Call this number if you have problems the morning of surgery: [phone]   Remember:   Do not eat food or drink liquids after midnight.   Take these medicines the morning of surgery with A SIP OF WATER: amlodipine            STOP aspirin , and any nsaids now   Do not wear jewelry, make-up or nail polish.  Do not wear lotions, powders, or perfumes. You may wear deodorant.  Do not shave 48 hours prior to surgery. Men may shave face and neck.  Do not bring valuables to the hospital.  [facility] is not responsible                   for any belongings or valuables.  Contacts, dentures or bridgework may not be worn into surgery.  Leave suitcase in the car. After surgery it may be brought to your room.  For patients admitted to the hospital, checkout time is 11:00 AM the day of  discharge.   Patients discharged the day of surgery will not be allowed to drive  home.  Name and phone number of your driver:  Special Instructions: Shower using CHG 2 nights before surgery and the night before surgery.  If you shower the day of surgery use CHG.  Use special wash - you have one bottle of CHG for all showers.  You should use approximately 1/3 of the bottle for each shower.   Please read over the following fact sheets that you were given: Pain Booklet, Coughing and Deep Breathing and Surgical Site Infection Prevention

## 2013-07-16 ENCOUNTER — Encounter (HOSPITAL_COMMUNITY): Payer: Self-pay

## 2013-07-16 MED ORDER — CEFAZOLIN SODIUM-DEXTROSE 2-3 GM-% IV SOLR
2.0000 g | INTRAVENOUS | Status: AC
  Administered 2013-07-17: 2 g via INTRAVENOUS
  Filled 2013-07-16: qty 50

## 2013-07-16 NOTE — Progress Notes (Signed)
Anesthesia chart review: Patient is a 52 year old female scheduled for C4-5, C5-6 ACDF on 07/17/2012 by Dr. Jeral Fruit. 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 PCP is Tomi Bamberger, NP.  EKG on 03/14/13 showed ST @ 109 bpm, non-specific intra-ventricular conduction block versus left BBB, consider anteroseptal infarct (age undetermined).  Her pre-op EKG on 12/03/12 showed left BBB, and she was referred to [facility] and saw Dr. Anne Fu.  Nuclear stress was ordered and done on 12/05/12 and showed no ischemia or infarct/scar, normal EF 60%, wall motion abnormality consistent with left BBB, low risk study.  Chest x-ray on 03/14/2013 showed stable chronic obstructive pulmonary disease. No acute cardiopulmonary process.  Preoperative labs noted. Will order a fasting CBG on arrival since her glucose was 196.  Anticipate that she can proceed as planned.  Velna Ochs [facility] Short Stay Center/Anesthesiology Phone [phone] 07/16/2013 9:44 AM

## 2013-07-17 ENCOUNTER — Ambulatory Visit (HOSPITAL_COMMUNITY): Payer: 59 | Admitting: Anesthesiology

## 2013-07-17 ENCOUNTER — Encounter (HOSPITAL_COMMUNITY): Payer: Self-pay | Admitting: *Deleted

## 2013-07-17 ENCOUNTER — Encounter (HOSPITAL_COMMUNITY)
Admission: RE | Disposition: A | Discharge status: Home / Self Care | Payer: Self-pay | Source: Ambulatory Visit | Attending: Neurosurgery

## 2013-07-17 ENCOUNTER — Inpatient Hospital Stay (HOSPITAL_COMMUNITY)
Admission: RE | Admit: 2013-07-17 | Discharge: 2013-07-18 | DRG: 473 | Disposition: A | Discharge status: Home / Self Care | Payer: 59 | Source: Ambulatory Visit | Attending: Neurosurgery | Admitting: Neurosurgery

## 2013-07-17 ENCOUNTER — Encounter (HOSPITAL_COMMUNITY): Payer: Self-pay | Admitting: Vascular Surgery

## 2013-07-17 ENCOUNTER — Ambulatory Visit (HOSPITAL_COMMUNITY): Payer: 59

## 2013-07-17 DIAGNOSIS — M069 Rheumatoid arthritis, unspecified: Secondary | ICD-10-CM | POA: Diagnosis present

## 2013-07-17 DIAGNOSIS — M5030 Other cervical disc degeneration, unspecified cervical region: Principal | ICD-10-CM | POA: Diagnosis present

## 2013-07-17 DIAGNOSIS — Z7982 Long term (current) use of aspirin: Secondary | ICD-10-CM

## 2013-07-17 DIAGNOSIS — F17200 Nicotine dependence, unspecified, uncomplicated: Secondary | ICD-10-CM | POA: Diagnosis present

## 2013-07-17 DIAGNOSIS — Z01812 Encounter for preprocedural laboratory examination: Secondary | ICD-10-CM

## 2013-07-17 DIAGNOSIS — Z79899 Other long term (current) drug therapy: Secondary | ICD-10-CM

## 2013-07-17 HISTORY — PX: ANTERIOR CERVICAL DECOMP/DISCECTOMY FUSION: SHX1161

## 2013-07-17 LAB — GLUCOSE, CAPILLARY: Glucose-Capillary: 99 mg/dL (ref 70–99)

## 2013-07-17 SURGERY — ANTERIOR CERVICAL DECOMPRESSION/DISCECTOMY FUSION 2 LEVELS
Anesthesia: General | Site: Neck | Wound class: Clean

## 2013-07-17 MED ORDER — DIAZEPAM 5 MG PO TABS: 5.0000 mg | ORAL_TABLET | Freq: Four times a day (QID) | ORAL | Status: DC | PRN

## 2013-07-17 MED ORDER — SODIUM CHLORIDE 0.9 % IJ SOLN: 3.0000 mL | INTRAMUSCULAR | Status: DC | PRN

## 2013-07-17 MED ORDER — MIDAZOLAM HCL 5 MG/5ML IJ SOLN
INTRAMUSCULAR | Status: DC | PRN
  Administered 2013-07-17: 2 mg via INTRAVENOUS

## 2013-07-17 MED ORDER — FENTANYL CITRATE 0.05 MG/ML IJ SOLN
INTRAMUSCULAR | Status: DC | PRN
  Administered 2013-07-17 (×2): 50 ug via INTRAVENOUS
  Administered 2013-07-17: 150 ug via INTRAVENOUS

## 2013-07-17 MED ORDER — DEXAMETHASONE SODIUM PHOSPHATE 4 MG/ML IJ SOLN
4.0000 mg | Freq: Four times a day (QID) | INTRAMUSCULAR | Status: DC
  Administered 2013-07-17: 4 mg via INTRAVENOUS
  Filled 2013-07-17 (×5): qty 1

## 2013-07-17 MED ORDER — PROPOFOL 10 MG/ML IV BOLUS
INTRAVENOUS | Status: DC | PRN
  Administered 2013-07-17: 150 mg via INTRAVENOUS

## 2013-07-17 MED ORDER — ONDANSETRON HCL 4 MG/2ML IJ SOLN: 4.0000 mg | INTRAMUSCULAR | Status: DC | PRN

## 2013-07-17 MED ORDER — MENTHOL 3 MG MT LOZG
1.0000 | LOZENGE | OROMUCOSAL | Status: DC | PRN
  Filled 2013-07-17: qty 9

## 2013-07-17 MED ORDER — NEOSTIGMINE METHYLSULFATE 1 MG/ML IJ SOLN
INTRAMUSCULAR | Status: DC | PRN
  Administered 2013-07-17: 3 mg via INTRAVENOUS

## 2013-07-17 MED ORDER — CEFAZOLIN SODIUM 1-5 GM-% IV SOLN
1.0000 g | Freq: Three times a day (TID) | INTRAVENOUS | Status: AC
  Administered 2013-07-17 – 2013-07-18 (×2): 1 g via INTRAVENOUS
  Filled 2013-07-17 (×2): qty 50

## 2013-07-17 MED ORDER — LIDOCAINE HCL (CARDIAC) 20 MG/ML IV SOLN
INTRAVENOUS | Status: DC | PRN
  Administered 2013-07-17: 60 mg via INTRAVENOUS

## 2013-07-17 MED ORDER — DEXAMETHASONE 4 MG PO TABS
4.0000 mg | ORAL_TABLET | Freq: Four times a day (QID) | ORAL | Status: DC
  Administered 2013-07-17 – 2013-07-18 (×2): 4 mg via ORAL
  Filled 2013-07-17 (×6): qty 1

## 2013-07-17 MED ORDER — THROMBIN 5000 UNITS EX SOLR
OROMUCOSAL | Status: DC | PRN
  Administered 2013-07-17: 11:00:00 via TOPICAL

## 2013-07-17 MED ORDER — HEMOSTATIC AGENTS (NO CHARGE) OPTIME
TOPICAL | Status: DC | PRN
  Administered 2013-07-17: 1 via TOPICAL

## 2013-07-17 MED ORDER — ZOLPIDEM TARTRATE 5 MG PO TABS: 5.0000 mg | ORAL_TABLET | Freq: Every evening | ORAL | Status: DC | PRN

## 2013-07-17 MED ORDER — SODIUM CHLORIDE 0.9 % IJ SOLN
3.0000 mL | Freq: Two times a day (BID) | INTRAMUSCULAR | Status: DC
  Administered 2013-07-17 (×2): 3 mL via INTRAVENOUS

## 2013-07-17 MED ORDER — HYDROMORPHONE HCL PF 1 MG/ML IJ SOLN
0.2500 mg | INTRAMUSCULAR | Status: DC | PRN
  Administered 2013-07-17 (×2): 0.5 mg via INTRAVENOUS

## 2013-07-17 MED ORDER — MORPHINE SULFATE 2 MG/ML IJ SOLN
1.0000 mg | INTRAMUSCULAR | Status: DC | PRN
  Administered 2013-07-17: 2 mg via INTRAVENOUS
  Filled 2013-07-17: qty 1

## 2013-07-17 MED ORDER — OXYCODONE-ACETAMINOPHEN 5-325 MG PO TABS
1.0000 | ORAL_TABLET | ORAL | Status: DC | PRN
  Administered 2013-07-17 – 2013-07-18 (×4): 2 via ORAL
  Filled 2013-07-17 (×4): qty 2

## 2013-07-17 MED ORDER — PHENOL 1.4 % MT LIQD
1.0000 | OROMUCOSAL | Status: DC | PRN
  Administered 2013-07-17: 1 via OROMUCOSAL
  Filled 2013-07-17: qty 177

## 2013-07-17 MED ORDER — 0.9 % SODIUM CHLORIDE (POUR BTL) OPTIME
TOPICAL | Status: DC | PRN
  Administered 2013-07-17: 1000 mL

## 2013-07-17 MED ORDER — PHENYLEPHRINE HCL 10 MG/ML IJ SOLN
INTRAMUSCULAR | Status: DC | PRN
  Administered 2013-07-17 (×3): 80 ug via INTRAVENOUS

## 2013-07-17 MED ORDER — ACETAMINOPHEN 650 MG RE SUPP: 650.0000 mg | RECTAL | Status: DC | PRN

## 2013-07-17 MED ORDER — ACETAMINOPHEN 325 MG PO TABS: 650.0000 mg | ORAL_TABLET | ORAL | Status: DC | PRN

## 2013-07-17 MED ORDER — ONDANSETRON HCL 4 MG/2ML IJ SOLN
INTRAMUSCULAR | Status: DC | PRN
  Administered 2013-07-17: 4 mg via INTRAVENOUS

## 2013-07-17 MED ORDER — DEXAMETHASONE SODIUM PHOSPHATE 4 MG/ML IJ SOLN
INTRAMUSCULAR | Status: DC | PRN
  Administered 2013-07-17: 8 mg via INTRAVENOUS

## 2013-07-17 MED ORDER — THROMBIN 5000 UNITS EX SOLR
CUTANEOUS | Status: DC | PRN
  Administered 2013-07-17 (×2): 5000 [IU] via TOPICAL

## 2013-07-17 MED ORDER — LACTATED RINGERS IV SOLN
INTRAVENOUS | Status: DC | PRN
  Administered 2013-07-17 (×2): via INTRAVENOUS

## 2013-07-17 MED ORDER — SODIUM CHLORIDE 0.9 % IV SOLN: INTRAVENOUS | Status: DC

## 2013-07-17 MED ORDER — HYDROMORPHONE HCL PF 1 MG/ML IJ SOLN
INTRAMUSCULAR | Status: AC
  Filled 2013-07-17: qty 1

## 2013-07-17 MED ORDER — ROCURONIUM BROMIDE 100 MG/10ML IV SOLN
INTRAVENOUS | Status: DC | PRN
  Administered 2013-07-17: 5 mg via INTRAVENOUS
  Administered 2013-07-17: 10 mg via INTRAVENOUS
  Administered 2013-07-17: 50 mg via INTRAVENOUS
  Administered 2013-07-17: 10 mg via INTRAVENOUS

## 2013-07-17 MED ORDER — AMLODIPINE BESYLATE 10 MG PO TABS
10.0000 mg | ORAL_TABLET | Freq: Every day | ORAL | Status: DC
  Filled 2013-07-17: qty 1

## 2013-07-17 MED ORDER — GLYCOPYRROLATE 0.2 MG/ML IJ SOLN
INTRAMUSCULAR | Status: DC | PRN
  Administered 2013-07-17: 0.4 mg via INTRAVENOUS

## 2013-07-17 SURGICAL SUPPLY — 56 items
10 MM DRILL BIT ×2 IMPLANT
BANDAGE GAUZE ELAST BULKY 4 IN (GAUZE/BANDAGES/DRESSINGS) ×4 IMPLANT
BENZOIN TINCTURE PRP APPL 2/3 (GAUZE/BANDAGES/DRESSINGS) ×2 IMPLANT
BIT DRILL 10MM (BIT) ×2 IMPLANT
BLADE ULTRA TIP 2M (BLADE) ×2 IMPLANT
BUR BARREL STRAIGHT FLUTE 4.0 (BURR) ×2 IMPLANT
BUR MATCHSTICK NEURO 3.0 LAGG (BURR) ×2 IMPLANT
CANISTER SUCTION 2500CC (MISCELLANEOUS) ×2 IMPLANT
CLOTH BEACON ORANGE TIMEOUT ST (SAFETY) ×2 IMPLANT
CONT SPEC 4OZ CLIKSEAL STRL BL (MISCELLANEOUS) ×2 IMPLANT
COVER MAYO STAND STRL (DRAPES) ×2 IMPLANT
DRAPE C-ARM 42X72 X-RAY (DRAPES) IMPLANT
DRAPE LAPAROTOMY 100X72 PEDS (DRAPES) ×2 IMPLANT
DRAPE MICROSCOPE LEICA (MISCELLANEOUS) ×2 IMPLANT
DRAPE POUCH INSTRU U-SHP 10X18 (DRAPES) ×2 IMPLANT
DURAPREP 6ML APPLICATOR 50/CS (WOUND CARE) ×2 IMPLANT
ELECT REM PT RETURN 9FT ADLT (ELECTROSURGICAL) ×2
ELECTRODE REM PT RTRN 9FT ADLT (ELECTROSURGICAL) ×1 IMPLANT
GAUZE SPONGE 4X4 16PLY XRAY LF (GAUZE/BANDAGES/DRESSINGS) IMPLANT
GLOVE BIO SURGEON STRL SZ8 (GLOVE) ×2 IMPLANT
GLOVE BIOGEL M 8.0 STRL (GLOVE) ×2 IMPLANT
GLOVE BIOGEL PI IND STRL 7.0 (GLOVE) ×1 IMPLANT
GLOVE BIOGEL PI INDICATOR 7.0 (GLOVE) ×1
GLOVE EXAM NITRILE LRG STRL (GLOVE) IMPLANT
GLOVE EXAM NITRILE MD LF STRL (GLOVE) IMPLANT
GLOVE EXAM NITRILE XL STR (GLOVE) IMPLANT
GLOVE EXAM NITRILE XS STR PU (GLOVE) IMPLANT
GLOVE SURG SS PI 7.0 STRL IVOR (GLOVE) ×4 IMPLANT
GOWN BRE IMP SLV AUR LG STRL (GOWN DISPOSABLE) ×2 IMPLANT
GOWN BRE IMP SLV AUR XL STRL (GOWN DISPOSABLE) ×4 IMPLANT
GOWN STRL REIN 2XL LVL4 (GOWN DISPOSABLE) IMPLANT
HEAD HALTER (SOFTGOODS) ×2 IMPLANT
HEMOSTAT POWDER KIT SURGIFOAM (HEMOSTASIS) IMPLANT
HEMOSTAT POWDER SURGIFOAM 1G (HEMOSTASIS) ×2 IMPLANT
KIT BASIN OR (CUSTOM PROCEDURE TRAY) ×2 IMPLANT
KIT ROOM TURNOVER OR (KITS) ×2 IMPLANT
NEEDLE SPNL 22GX3.5 QUINCKE BK (NEEDLE) ×2 IMPLANT
NS IRRIG 1000ML POUR BTL (IV SOLUTION) ×2 IMPLANT
PACK LAMINECTOMY NEURO (CUSTOM PROCEDURE TRAY) ×2 IMPLANT
PATTIES SURGICAL .5 X1 (DISPOSABLE) ×2 IMPLANT
PLATE ANT CERV XTEND 2 LV 30 (Plate) ×2 IMPLANT
PUTTY DBX 1CC (Putty) ×2 IMPLANT
PUTTY DBX 1CC DEPUY (Putty) ×1 IMPLANT
RUBBERBAND STERILE (MISCELLANEOUS) ×4 IMPLANT
SCREW XTD VAR 4.2 SELF TAP 12 (Screw) ×12 IMPLANT
SPACER ACDF SM LORDOTIC 7 (Spacer) ×4 IMPLANT
SPONGE GAUZE 4X4 12PLY (GAUZE/BANDAGES/DRESSINGS) ×2 IMPLANT
SPONGE INTESTINAL PEANUT (DISPOSABLE) ×2 IMPLANT
SPONGE SURGIFOAM ABS GEL SZ50 (HEMOSTASIS) ×2 IMPLANT
STRIP CLOSURE SKIN 1/2X4 (GAUZE/BANDAGES/DRESSINGS) ×2 IMPLANT
SUT VIC AB 3-0 SH 8-18 (SUTURE) ×2 IMPLANT
SYR 20ML ECCENTRIC (SYRINGE) ×2 IMPLANT
TAPE CLOTH SURG 4X10 WHT LF (GAUZE/BANDAGES/DRESSINGS) ×2 IMPLANT
TOWEL OR 17X24 6PK STRL BLUE (TOWEL DISPOSABLE) ×2 IMPLANT
TOWEL OR 17X26 10 PK STRL BLUE (TOWEL DISPOSABLE) ×2 IMPLANT
WATER STERILE IRR 1000ML POUR (IV SOLUTION) ×2 IMPLANT

## 2013-07-17 NOTE — Preoperative (Signed)
Beta Blockers   Reason not to administer Beta Blockers:Not Applicable 

## 2013-07-17 NOTE — Plan of Care (Signed)
Problem: Consults Goal: Diagnosis - Spinal Surgery Outcome: Completed/Met Date Met:  07/17/13 Cervical Spine Fusion

## 2013-07-17 NOTE — H&P (Signed)
Kim Braun is an 52 y.o. female.   Chief Complaint: neck painHPI: patient seen in my office with neck pain, headache, burning sensation in both u[[rr extremities no better with conservative treatment. Mri shows severe stenosis at c45, 56.  Past Medical History  Diagnosis Date  . Hypertension   . Rheumatoid arthritis(714.0)   . Smoker   . Thyroid nodule   . Heart murmur     child  . Gestational diabetes   . Left bundle branch block     11/2012 low risk, no ischemia, NL EF stress test (Dr. Anne Fu)    Past Surgical History  Procedure Laterality Date  . Cesarean section    . Colon surgery  age 41  . Robotic assisted total hysterectomy  12/19/2012    Procedure: ROBOTIC ASSISTED TOTAL HYSTERECTOMY;  Surgeon: Dorien Chihuahua. Richardson Dopp, MD;  Location: [facility];  Service: Gynecology;  Laterality: N/A;  Attempted Robotic Total Hysterectomy  . Bilateral salpingectomy  12/19/2012    Procedure: BILATERAL SALPINGECTOMY;  Surgeon: Dorien Chihuahua. Richardson Dopp, MD;  Location: [facility];  Service: Gynecology;  Laterality: Bilateral;  . Abdominal hysterectomy  12/19/2012    Procedure: HYSTERECTOMY ABDOMINAL;  Surgeon: Dorien Chihuahua. Richardson Dopp, MD;  Location: [facility];  Service: Gynecology;  Laterality: N/A;  Incision @ 1517  . Cystoscopy  12/19/2012    Procedure: CYSTOSCOPY;  Surgeon: Dorien Chihuahua. Richardson Dopp, MD;  Location: [facility];  Service: Gynecology;  Laterality: N/A;    History reviewed. No pertinent family history. Social History:  reports that she has been smoking Cigarettes.  She has a 40 pack-year smoking history. She does not have any smokeless tobacco history on file. She reports that  drinks alcohol. She reports that she does not use illicit drugs.  Allergies: No Known Allergies  Medications Prior to Admission  Medication Sig Dispense Refill  . amLODipine (NORVASC) 10 MG tablet Take 10 mg by mouth daily.      Marland Kitchen aspirin EC 81 MG tablet Take 162 mg by mouth daily.       . Certolizumab Pegol (CIMZIA [place]) Inject into the skin every 14 (fourteen)  days. Last administered      . Ergocalciferol (VITAMIN D2) 2000 UNITS TABS Take 2,000 Units by mouth daily.        Results for orders placed during the hospital encounter of 07/17/13 (from the past 48 hour(s))  GLUCOSE, CAPILLARY     Status: None   Collection Time    07/17/13  6:55 AM      Result Value Range   Glucose-Capillary 99  70 - 99 mg/dL   No results found.  Review of Systems  Constitutional: Negative.   HENT: Positive for neck pain.   Eyes: Negative.   Respiratory: Negative.   Gastrointestinal: Negative.   Genitourinary: Negative.   Skin: Negative.   Neurological: Positive for sensory change and focal weakness. Negative for headaches.  Endo/Heme/Allergies: Negative.   Psychiatric/Behavioral: Negative.     Blood pressure 148/89, pulse 85, temperature 97.8 F (36.6 C), temperature source Oral, resp. rate 18, last menstrual period 12/03/2012, SpO2 100.00%. Physical Exam hent, nl. Neck, painwith mobility. Lungs, clear. Cv, nl. Abdomen. Soft. Extremities, nl NEURO weakness biceps and wrist extensors. Sensory burning pain at c6 dermatomes.  Assessment/Plan Decompression and fusion at c45, 56. Aware of risks and benefits. Questions were fully answered in front of ms Daye, rn  Humberto Addo M 07/17/2013, 9:07 AM

## 2013-07-17 NOTE — Transfer of Care (Signed)
Immediate Anesthesia Transfer of Care Note  Patient: Kim Braun  Procedure(s) Performed: Procedure(s) with comments: Cervical four-five, Cervical five-six Anterior cervical decompression/diskectomy/fusion (N/A) - Cervical four-five, Cervical five-six Anterior cervical decompression/diskectomy/fusion  Patient Location: PACU  Anesthesia Type:General  Level of Consciousness: awake, alert  and oriented  Airway & Oxygen Therapy: Patient Spontanous Breathing and Patient connected to nasal cannula oxygen  Post-op Assessment: Report given to PACU RN, Post -op Vital signs reviewed and stable and Patient moving all extremities X 4  Post vital signs: Reviewed and stable  Complications: No apparent anesthesia complications

## 2013-07-17 NOTE — Anesthesia Procedure Notes (Signed)
Procedure Name: Intubation Date/Time: 07/17/2013 9:24 AM Performed by: Quentin Ore Pre-anesthesia Checklist: Patient identified, Emergency Drugs available, Suction available, Patient being monitored and Timeout performed Patient Re-evaluated:Patient Re-evaluated prior to inductionOxygen Delivery Method: Circle system utilized Preoxygenation: Pre-oxygenation with 100% oxygen Intubation Type: IV induction Ventilation: Mask ventilation without difficulty Laryngoscope Size: Mac and 3 Grade View: Grade I Tube type: Oral Tube size: 7.0 mm Number of attempts: 1 Airway Equipment and Method: Stylet Placement Confirmation: ETT inserted through vocal cords under direct vision,  positive ETCO2 and breath sounds checked- equal and bilateral Secured at: 22 cm Tube secured with: Tape Dental Injury: Teeth and Oropharynx as per pre-operative assessment

## 2013-07-17 NOTE — Anesthesia Preprocedure Evaluation (Addendum)
Anesthesia Evaluation  Patient identified by MRN, date of birth, ID band Patient awake    Reviewed: Allergy & Precautions, H&P , NPO status , Patient's Chart, lab work & pertinent test results  Airway Mallampati: II  Neck ROM: Full    Dental  (+) Teeth Intact   Pulmonary Current Smoker,  breath sounds clear to auscultation        Cardiovascular hypertension, Pt. on medications Rhythm:Regular Rate:Normal     Neuro/Psych    GI/Hepatic Neg liver ROS, GERD-  ,  Endo/Other    Renal/GU negative Renal ROS     Musculoskeletal  (+) Arthritis -, Osteoarthritis,    Abdominal   Peds  Hematology   Anesthesia Other Findings   Reproductive/Obstetrics                         Anesthesia Physical Anesthesia Plan  ASA: III  Anesthesia Plan: General   Post-op Pain Management:    Induction: Intravenous  Airway Management Planned: Oral ETT  Additional Equipment:   Intra-op Plan:   Post-operative Plan: Extubation in OR  Informed Consent: I have reviewed the patients History and Physical, chart, labs and discussed the procedure including the risks, benefits and alternatives for the proposed anesthesia with the patient or authorized representative who has indicated his/her understanding and acceptance.   Dental advisory given  Plan Discussed with: CRNA and Anesthesiologist  Anesthesia Plan Comments:        Anesthesia Quick Evaluation

## 2013-07-17 NOTE — Progress Notes (Signed)
Op note [phone]

## 2013-07-17 NOTE — Anesthesia Postprocedure Evaluation (Signed)
  Anesthesia Post-op Note  Patient: Kim Braun  Procedure(s) Performed: Procedure(s) with comments: Cervical four-five, Cervical five-six Anterior cervical decompression/diskectomy/fusion (N/A) - Cervical four-five, Cervical five-six Anterior cervical decompression/diskectomy/fusion  Patient Location: PACU  Anesthesia Type:General  Level of Consciousness: awake  Airway and Oxygen Therapy: Patient Spontanous Breathing  Post-op Pain: mild  Post-op Assessment: Post-op Vital signs reviewed  Post-op Vital Signs: Reviewed  Complications: No apparent anesthesia complications

## 2013-07-18 NOTE — Discharge Summary (Signed)
Physician Discharge Summary  Patient ID: DENISSE WHITENACK MRN: [id number] DOB/AGE: [birth year] 52 y.o.  Admit date: 07/17/2013 Discharge date: 07/18/2013  Admission Diagnoses:c45, 56 stenosis  Discharge Diagnoses: same Active Problems:   * No active hospital problems. *   Discharged Condition:no weakness  Hospital Course: surgery  Consults: none  Significant Diagnostic Studies:mri  Treatments: cervical fusion  Discharge Exam: Blood pressure 155/81, pulse 72, temperature 98.6 F (37 C), temperature source Oral, resp. rate 16, last menstrual period 12/03/2012, SpO2 93.00%. No pain, no weakness  Disposition: home     Medication List    ASK your doctor about these medications       amLODipine 10 MG tablet  Commonly known as:  NORVASC  Take 10 mg by mouth daily.     aspirin EC 81 MG tablet  Take 162 mg by mouth daily.     CIMZIA [place]  Inject into the skin every 14 (fourteen) days. Last administered     Vitamin D2 2000 UNITS Tabs  Take 2,000 Units by mouth daily.         Signed: Karn Cassis 07/18/2013, 9:18 AM  C45,56 stenosis

## 2013-07-18 NOTE — Op Note (Signed)
NAMEASHIMA, SHRAKE               ACCOUNT NO.:  [id number]  MEDICAL RECORD NO.:  [id number]  LOCATION:  3C04C                        FACILITY:  [facility]  PHYSICIAN:  Hilda Lias, M.D.   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  PROCEDURE:  Anterior C4-5, C5-6 diskectomy, decompression of spinal cord, bilateral foraminotomy, interbody fusion with cages, plate, microscope.  SURGEON:  Hilda Lias, M.D.  ASSISTANT:  Dr. Yetta Barre.  CLINICAL HISTORY:  The patient is admitted because of neck pain associated with weakness of biceps with central radiculopathy.  MRI shows restenosis at the level of C4-5 and C5-6.  Surgery was advised and the risks were fully explained in my office and again in the preop area. She knew the risk of the surgery such as no improvement, infection, CSF leak, need for further surgery.  DESCRIPTION OF PROCEDURE:  The patient was taken to the OR and after intubation, the neck was cleaned with DuraPrep.  Drapes were applied. Transverse incision through the skin, subcutaneous tissue was carried down.  X-ray showed that indeed we were at the c4-5.  __then________ with the help of the microscope, we removed the anterior osteophyte of C4-5 and C5-6, and anterior ligament was opened.  At the level C4-5, she has a severe degenerative disk disease with almost no disk.  We drilled  all the way posteriorly to the ligament.  Decompression of the spinal cord as well as both ____c5______ nerve root was achieved. That area was quite narrow.  The foramen__________ was worse on the right side. At the level of 5-6, we found the same finding with stenosis which was similar bilaterally.  Decompression of the cord at the  C6 nerve root was done.  Then, the endplates were drilled and 2 cages of 7-mm height, lordotic with DBX and autograft__________ were inserted followed by a plate with 6 screws.  Lateral cervical spine showed good position of the plate and the cage.  Irrigation was done.  Hemostasis was achieved.  Once we were sure that there was no bleeding, the area was closed with Vicryl and Steri-Strips.          ______________________________ Hilda Lias, M.D.     EB/MEDQ  D:  07/17/2013  T:  07/17/2013  Job:  [id number]

## 2013-07-18 NOTE — Progress Notes (Signed)
Pt given D/C instructions with Rx's, verbal understanding given. Pt D/C'd home via wheelchair @ [phone] per MD order. Rema Fendt, RN

## 2013-07-22 ENCOUNTER — Encounter (HOSPITAL_COMMUNITY): Payer: Self-pay | Admitting: Neurosurgery

## 2014-04-07 ENCOUNTER — Encounter (HOSPITAL_COMMUNITY): Payer: Self-pay | Admitting: Emergency Medicine

## 2014-04-07 ENCOUNTER — Emergency Department (HOSPITAL_COMMUNITY)
Admission: EM | Admit: 2014-04-07 | Discharge: 2014-04-07 | Disposition: A | Discharge status: Home / Self Care | Payer: 59 | Source: Home / Self Care | Attending: Family Medicine | Admitting: Family Medicine

## 2014-04-07 DIAGNOSIS — I889 Nonspecific lymphadenitis, unspecified: Secondary | ICD-10-CM

## 2014-04-07 MED ORDER — IBUPROFEN 800 MG PO TABS
800.0000 mg | ORAL_TABLET | Freq: Once | ORAL | Status: AC
  Administered 2014-04-07: 800 mg via ORAL

## 2014-04-07 MED ORDER — IBUPROFEN 800 MG PO TABS
ORAL_TABLET | ORAL | Status: AC
  Filled 2014-04-07: qty 1

## 2014-04-07 MED ORDER — ETODOLAC 500 MG PO TABS: 500.0000 mg | ORAL_TABLET | Freq: Two times a day (BID) | ORAL | Status: DC

## 2014-04-07 MED ORDER — DOXYCYCLINE HYCLATE 100 MG PO TABS
200.0000 mg | ORAL_TABLET | Freq: Once | ORAL | Status: AC
  Administered 2014-04-07: 200 mg via ORAL

## 2014-04-07 MED ORDER — DOXYCYCLINE HYCLATE 100 MG PO TABS
ORAL_TABLET | ORAL | Status: AC
  Filled 2014-04-07: qty 2

## 2014-04-07 MED ORDER — DOXYCYCLINE HYCLATE 100 MG PO CAPS: 100.0000 mg | ORAL_CAPSULE | Freq: Two times a day (BID) | ORAL | Status: DC

## 2014-04-07 NOTE — ED Notes (Signed)
C/o neck pain.  Left sided swelling up to behind left ear.  States "hard to swallow".   Tightness in neck.  Symptoms present since Thursday evening.  No relief with otc pain meds.

## 2014-04-07 NOTE — ED Provider Notes (Signed)
Medical screening examination/treatment/procedure(s) were performed by resident physician or non-physician practitioner and as supervising physician I was immediately available for consultation/collaboration.   Pauline Good MD.   Billy Fischer, MD 04/07/14 2157

## 2014-04-07 NOTE — ED Provider Notes (Signed)
CSN: [id number]     Arrival date & time 04/07/14  1536 History   First MD Initiated Contact with Patient 04/07/14 1600     Chief Complaint  Patient presents with  . Neck Pain   (Consider location/radiation/quality/duration/timing/severity/associated sxs/prior Treatment) HPI Comments: 53 year old female presents complaining of left-sided neck swelling, left ear pain, left sided sore throat. This began initially 6 days ago. It began as a very small area of swelling underneath her left ear. It subsequently swelled more and went down her neck. Since then, the swelling has waxed and waned, it is not that swollen right now. She also has pain in the left ear and on the left side of her throat. She denies any systemic symptoms. She does not feel sick right now. She does take a biologic medication for rheumatoid arthritis.  Patient is a 53 y.o. female presenting with neck pain.  Neck Pain   Past Medical History  Diagnosis Date  . Hypertension   . Rheumatoid arthritis(714.0)   . Smoker   . Thyroid nodule   . Heart murmur     child  . Gestational diabetes   . Left bundle branch block     11/2012 low risk, no ischemia, NL EF stress test (Dr. Marlou Porch)   Past Surgical History  Procedure Laterality Date  . Cesarean section    . Colon surgery  age 72  . Robotic assisted total hysterectomy  12/19/2012    Procedure: ROBOTIC ASSISTED TOTAL HYSTERECTOMY;  Surgeon: Maeola Sarah. Landry Mellow, MD;  Location: [facility];  Service: Gynecology;  Laterality: N/A;  Attempted Robotic Total Hysterectomy  . Bilateral salpingectomy  12/19/2012    Procedure: BILATERAL SALPINGECTOMY;  Surgeon: Maeola Sarah. Landry Mellow, MD;  Location: [place] [facility];  Service: Gynecology;  Laterality: Bilateral;  . Abdominal hysterectomy  12/19/2012    Procedure: HYSTERECTOMY ABDOMINAL;  Surgeon: Maeola Sarah. Landry Mellow, MD;  Location: [facility] [place] [facility];  Service: Gynecology;  Laterality: N/A;  Incision @ 5400  . Cystoscopy  12/19/2012    Procedure: CYSTOSCOPY;  Surgeon: Maeola Sarah. Landry Mellow, MD;   Location: [place] [facility];  Service: Gynecology;  Laterality: N/A;  . Anterior cervical decomp/discectomy fusion N/A 07/17/2013    Procedure: Cervical four-five, Cervical five-six Anterior cervical decompression/diskectomy/fusion;  Surgeon: Floyce Stakes, MD;  Location: [facility];  Service: Neurosurgery;  Laterality: N/A;  Cervical four-five, Cervical five-six Anterior cervical decompression/diskectomy/fusion   History reviewed. No pertinent family history. History  Substance Use Topics  . Smoking status: Current Every Day Smoker -- 1.00 packs/day for 40 years    Types: Cigarettes  . Smokeless tobacco: Not on file  . Alcohol Use: 0.0 oz/week    14-24 Cans of beer per week   OB History   Grav Para Term Preterm Abortions TAB SAB Ect Mult Living                 Review of Systems  HENT: Positive for ear pain and sore throat.   Musculoskeletal: Positive for neck pain and neck stiffness.  Hematological: Positive for adenopathy.  All other systems reviewed and are negative.   Allergies  Review of patient's allergies indicates no known allergies.  Home Medications   Prior to Admission medications   Medication Sig Start Date End Date Taking? Authorizing Provider  amLODipine (NORVASC) 10 MG tablet Take 10 mg by mouth daily.   Yes Historical Provider, MD  aspirin EC 81 MG tablet Take 162 mg by mouth daily.    Yes Historical Provider, MD  Certolizumab Pegol [facility]  [place]) Inject into the skin every 14 (fourteen) days. Last administered   Yes Historical Provider, MD  doxycycline (VIBRAMYCIN) 100 MG capsule Take 1 capsule (100 mg total) by mouth 2 (two) times daily. 04/07/14   Liam Graham, PA-C  Ergocalciferol (VITAMIN D2) 2000 UNITS TABS Take 2,000 Units by mouth daily.    Historical Provider, MD  etodolac (LODINE) 500 MG tablet Take 1 tablet (500 mg total) by mouth 2 (two) times daily. 04/07/14   Freeman Caldron Faisal Stradling, PA-C   BP 146/88  Pulse 83  Temp(Src) 98.4 F (36.9 C) (Oral)  Resp 16  SpO2  100%  LMP 12/03/2012 Physical Exam  Nursing note and vitals reviewed. Constitutional: She is oriented to person, place, and time. Vital signs are normal. She appears well-developed and well-nourished. No distress.  HENT:  Head: Normocephalic and atraumatic.  Right Ear: External ear normal.  Left Ear: External ear normal.  Nose: Nose normal.  Mouth/Throat: Oropharynx is clear and moist. No oropharyngeal exudate.  Eyes: Conjunctivae are normal. Right eye exhibits no discharge. Left eye exhibits no discharge.  Neck: Normal range of motion. Neck supple.  Pulmonary/Chest: Effort normal. No respiratory distress.  Lymphadenopathy:       Head (right side): No submental, no submandibular, no tonsillar, no preauricular and no posterior auricular adenopathy present.       Head (left side): Tonsillar (very firm, tender LAD) adenopathy present. No submental, no submandibular, no preauricular and no posterior auricular adenopathy present.    She has no cervical adenopathy.  Neurological: She is alert and oriented to person, place, and time. She has normal strength. Coordination normal.  Skin: Skin is warm and dry. No rash noted. She is not diaphoretic.  Psychiatric: She has a normal mood and affect. Judgment normal.    ED Course  Procedures (including critical care time) Labs Review Labs Reviewed - No data to display  Imaging Review No results found.   MDM   1. Lymphadenitis    This lymphadenopathy is very firm. We'll treat with NSAID and antibiotic, is it is not getting any better in 3 days she will followup with her primary care physician to arrange an ultrasound of the lymph node. ED if acutely worsening   Meds ordered this encounter  Medications  . doxycycline (VIBRA-TABS) tablet 200 mg    Sig:   . ibuprofen (ADVIL,MOTRIN) tablet 800 mg    Sig:   . doxycycline (VIBRAMYCIN) 100 MG capsule    Sig: Take 1 capsule (100 mg total) by mouth 2 (two) times daily.    Dispense:  14 capsule     Refill:  0    Order Specific Question:  Supervising Provider    Answer:  Lynne Leader, [place]  . etodolac (LODINE) 500 MG tablet    Sig: Take 1 tablet (500 mg total) by mouth 2 (two) times daily.    Dispense:  30 tablet    Refill:  0    Order Specific Question:  Supervising Provider    Answer:  Lynne Leader, [place]       Liam Graham, PA-C 04/07/14 1816

## 2014-04-07 NOTE — Discharge Instructions (Signed)
Cervical Adenitis You have a swollen lymph gland in your neck. This commonly happens with Strep and virus infections, dental problems, insect bites, and injuries about the face, scalp, or neck. The lymph glands swell as the body fights the infection or heals the injury. Swelling and firmness typically lasts for several weeks after the infection or injury is healed. Rarely lymph glands can become swollen because of cancer or TB. Antibiotics are prescribed if there is evidence of an infection. Sometimes an infected lymph gland becomes filled with pus. This condition may require opening up the abscessed gland by draining it surgically. Most of the time infected glands return to normal within two weeks. Do not poke or squeeze the swollen lymph nodes. That may keep them from shrinking back to their normal size. If the lymph gland is still swollen after 2 weeks, further medical evaluation is needed.  SEEK IMMEDIATE MEDICAL CARE IF:  You have difficulty swallowing or breathing, increased swelling, severe pain, or a high fever.  Document Released: 11/14/2005 Document Revised: 02/06/2012 Document Reviewed: 05/06/2007 [facility] Patient Information 2014 [place].  Swollen Lymph Nodes The lymphatic system filters fluid from around cells. It is like a system of blood vessels. These channels carry lymph instead of blood. The lymphatic system is an important part of the immune (disease fighting) system. When people talk about "swollen glands in the neck," they are usually talking about swollen lymph nodes. The lymph nodes are like the little traps for infection. You and your caregiver may be able to feel lymph nodes, especially swollen nodes, in these common areas: the groin (inguinal area), armpits (axilla), and above the clavicle (supraclavicular). You may also feel them in the neck (cervical) and the back of the head just above the hairline (occipital). Swollen glands occur when there is any condition in which  the body responds with an allergic type of reaction. For instance, the glands in the neck can become swollen from insect bites or any type of minor infection on the head. These are very noticeable in children with only minor problems. Lymph nodes may also become swollen when there is a tumor or problem with the lymphatic system, such as Hodgkin's disease. TREATMENT   Most swollen glands do not require treatment. They can be observed (watched) for a short period of time, if your caregiver feels it is necessary. Most of the time, observation is not necessary.  Antibiotics (medicines that kill germs) may be prescribed by your caregiver. Your caregiver may prescribe these if he or she feels the swollen glands are due to a bacterial (germ) infection. Antibiotics are not used if the swollen glands are caused by a virus. HOME CARE INSTRUCTIONS   Take medications as directed by your caregiver. Only take over-the-counter or prescription medicines for pain, discomfort, or fever as directed by your caregiver. SEEK MEDICAL CARE IF:   If you begin to run a temperature greater than 102 F (38.9 C), or as your caregiver suggests. MAKE SURE YOU:   Understand these instructions.  Will watch your condition.  Will get help right away if you are not doing well or get worse. Document Released: 11/04/2002 Document Revised: 02/06/2012 Document Reviewed: 11/14/2005 [facility] Patient Information 2014 [place].

## 2014-07-30 ENCOUNTER — Other Ambulatory Visit: Payer: Self-pay | Admitting: Nurse Practitioner

## 2014-07-30 ENCOUNTER — Ambulatory Visit
Admission: RE | Admit: 2014-07-30 | Discharge: 2014-07-30 | Disposition: A | Discharge status: Home / Self Care | Payer: 59 | Source: Ambulatory Visit | Attending: Nurse Practitioner | Admitting: Nurse Practitioner

## 2014-07-30 DIAGNOSIS — F17200 Nicotine dependence, unspecified, uncomplicated: Secondary | ICD-10-CM

## 2014-07-30 DIAGNOSIS — R0602 Shortness of breath: Secondary | ICD-10-CM

## 2014-08-18 ENCOUNTER — Encounter: Payer: Self-pay | Admitting: General Surgery

## 2014-09-23 ENCOUNTER — Encounter (HOSPITAL_COMMUNITY): Payer: Self-pay | Admitting: Pharmacy Technician

## 2014-09-28 DIAGNOSIS — R0602 Shortness of breath: Secondary | ICD-10-CM

## 2014-09-28 DIAGNOSIS — I420 Dilated cardiomyopathy: Secondary | ICD-10-CM

## 2014-09-30 ENCOUNTER — Ambulatory Visit (HOSPITAL_COMMUNITY)
Admission: RE | Admit: 2014-09-30 | Discharge: 2014-09-30 | Disposition: A | Discharge status: Home / Self Care | Payer: 59 | Source: Ambulatory Visit | Attending: Cardiology | Admitting: Cardiology

## 2014-09-30 ENCOUNTER — Encounter (HOSPITAL_COMMUNITY)
Admission: RE | Disposition: A | Discharge status: Home / Self Care | Payer: Self-pay | Source: Ambulatory Visit | Attending: Cardiology

## 2014-09-30 DIAGNOSIS — I420 Dilated cardiomyopathy: Secondary | ICD-10-CM

## 2014-09-30 DIAGNOSIS — E782 Mixed hyperlipidemia: Secondary | ICD-10-CM | POA: Diagnosis not present

## 2014-09-30 DIAGNOSIS — R0609 Other forms of dyspnea: Secondary | ICD-10-CM | POA: Diagnosis present

## 2014-09-30 DIAGNOSIS — Z7982 Long term (current) use of aspirin: Secondary | ICD-10-CM | POA: Insufficient documentation

## 2014-09-30 DIAGNOSIS — F17210 Nicotine dependence, cigarettes, uncomplicated: Secondary | ICD-10-CM | POA: Diagnosis not present

## 2014-09-30 DIAGNOSIS — R0602 Shortness of breath: Secondary | ICD-10-CM

## 2014-09-30 DIAGNOSIS — I10 Essential (primary) hypertension: Secondary | ICD-10-CM | POA: Diagnosis not present

## 2014-09-30 DIAGNOSIS — R0600 Dyspnea, unspecified: Secondary | ICD-10-CM | POA: Diagnosis not present

## 2014-09-30 DIAGNOSIS — I447 Left bundle-branch block, unspecified: Secondary | ICD-10-CM | POA: Insufficient documentation

## 2014-09-30 HISTORY — PX: CARDIAC CATHETERIZATION: SHX172

## 2014-09-30 LAB — PROTIME-INR
INR: 1.03 (ref 0.00–1.49)
Prothrombin Time: 13.6 seconds (ref 11.6–15.2)

## 2014-09-30 SURGERY — LEFT HEART CATH AND CORONARY ANGIOGRAPHY

## 2014-09-30 MED ORDER — LIDOCAINE HCL (PF) 1 % IJ SOLN
INTRAMUSCULAR | Status: AC
  Filled 2014-09-30: qty 30

## 2014-09-30 MED ORDER — SODIUM CHLORIDE 0.9 % IJ SOLN
3.0000 mL | Freq: Two times a day (BID) | INTRAMUSCULAR | Status: DC
Start: 2014-09-30 — End: 2014-09-30

## 2014-09-30 MED ORDER — SODIUM CHLORIDE 0.9 % IJ SOLN: 3.0000 mL | INTRAMUSCULAR | Status: DC | PRN

## 2014-09-30 MED ORDER — NITROGLYCERIN 1 MG/10 ML FOR IR/CATH LAB
INTRA_ARTERIAL | Status: AC
  Filled 2014-09-30: qty 10

## 2014-09-30 MED ORDER — ASPIRIN 81 MG PO CHEW
81.0000 mg | CHEWABLE_TABLET | ORAL | Status: AC
  Administered 2014-09-30: 81 mg via ORAL

## 2014-09-30 MED ORDER — MIDAZOLAM HCL 2 MG/2ML IJ SOLN
INTRAMUSCULAR | Status: AC
  Filled 2014-09-30: qty 2

## 2014-09-30 MED ORDER — HYDROMORPHONE HCL 1 MG/ML IJ SOLN
INTRAMUSCULAR | Status: AC
  Filled 2014-09-30: qty 1

## 2014-09-30 MED ORDER — HEPARIN SODIUM (PORCINE) 1000 UNIT/ML IJ SOLN
INTRAMUSCULAR | Status: AC
  Filled 2014-09-30: qty 1

## 2014-09-30 MED ORDER — HEPARIN (PORCINE) IN NACL 2-0.9 UNIT/ML-% IJ SOLN
INTRAMUSCULAR | Status: AC
  Filled 2014-09-30: qty 1000

## 2014-09-30 MED ORDER — SODIUM CHLORIDE 0.9 % IV SOLN
INTRAVENOUS | Status: DC
  Administered 2014-09-30: 12:00:00 via INTRAVENOUS

## 2014-09-30 MED ORDER — SODIUM CHLORIDE 0.9 % IV SOLN: 1.0000 mL/kg/h | INTRAVENOUS | Status: DC

## 2014-09-30 MED ORDER — SODIUM CHLORIDE 0.9 % IV SOLN: 250.0000 mL | INTRAVENOUS | Status: DC | PRN

## 2014-09-30 MED ORDER — ASPIRIN 81 MG PO CHEW
CHEWABLE_TABLET | ORAL | Status: AC
  Filled 2014-09-30: qty 1

## 2014-09-30 MED ORDER — VERAPAMIL HCL 2.5 MG/ML IV SOLN
INTRAVENOUS | Status: AC
  Filled 2014-09-30: qty 2

## 2014-09-30 NOTE — Interval H&P Note (Signed)
History and Physical Interval Note:  09/30/2014 1:20 PM  Kim Braun  has presented today for surgery, with the diagnosis of SOB,cardiomyopathy  The various methods of treatment have been discussed with the patient and family. After consideration of risks, benefits and other options for treatment, the patient has consented to  Procedure(s): LEFT HEART CATHETERIZATION WITH CORONARY ANGIOGRAM (N/A) and possble PCI  as a surgical intervention .  The patient's history has been reviewed, patient examined, no change in status, stable for surgery.  I have reviewed the patient's chart and labs.  Questions were answered to the patient's satisfaction.   Cath Lab Visit (complete for each Cath Lab visit)  Clinical Evaluation Leading to the Procedure:   ACS: No.  Non-ACS:    Anginal Classification: CCS III  Anti-ischemic medical therapy: No Therapy  Non-Invasive Test Results: Intermediate-risk stress test findings: cardiac mortality 1-3%/year  Prior CABG: No previous CABG        [facility]

## 2014-09-30 NOTE — H&P (Signed)
  Please see office visit notes for complete details of HPI.  

## 2014-09-30 NOTE — CV Procedure (Signed)
Procedure performed:  Left heart catheterization including hemodynamic monitoring of the left ventricle, LV gram, selective right and left coronary arteriography.  Indication patient is a 53 year-old Caucasian female with history of hypertension,  hyperlipidemia, tobacco use disorder  who presents with worsening dyspnea on exertion. Patient has  had mildly abnormalnon invasive testing which had revealed antral wall thickening but no definite ischemia. Mildly depressed ejection fraction. Echocardiogram had revealed mild anterolateral hypokinesis, patient has chronic left bundle branch block. However due to her symptoms of dyspnea and difficulty in excluding coronary artery disease, given her multiple cardiovascular risk factors, patient brought to the cardiac catheterization lab to evaluate the  coronary anatomy for definitive diagnosis of CAD.  Hemodynamic data:  Left ventricular pressure was 101/1 with LVEDP of 1 mm mercury. Aortic pressure was 100/64 with a mean of 79 mm mercury. There was no pressure gradient across the aortic valve  Left ventricle: Performed in the RAO projection revealed LVEF of 60%. There was no significant MR. no wall motion abnormality.  Right coronary artery: The vessel is smooth, normal,  Dominant.  Left main coronary artery is large and normal.  Circumflex coronary artery: A large vessel giving origin to a large obtuse marginal 1. It is smooth and normal. It is tortuous  Ramus intermediate: large caliber vessel, smooth and normal..  LAD:  LAD gives origin to a large diagonal-1.  LAD has moderate to severe tortuosity in the mid to distal segment. There is mild proximal coronary calcification. No significant luminal obstruction evident.  Impression: No significant coronary artery disease by coronary angiography, mild proximal coronary calcification involving LAD. Normal left ventricle systolic function.   Technique: Under sterile precautions using a 6 French right  radial  arterial access, a 6 French sheath was introduced into the right radial artery. A 5 [place] Tig 4 catheter was advanced into the ascending aorta selective  right coronary artery and left coronary artery was cannulated and angiography was performed in multiple views. The catheter was pulled back Out of the body over exchange length J-wire. Same Catheter was used to perform LV gram which was performed in RAO projection. Catheter exchanged out of the body over J-Wire. NO immediate complications noted. Patient tolerated the procedure well. A total of 40 mL of contrast was utilized for diagnostic angiography.  Rec: Medical therapy with aggressive risk factor reduction.   Disposition: Will be discharged home today with outpatient follow up.

## 2014-09-30 NOTE — Discharge Instructions (Signed)
Radial Site Care Refer to this sheet in the next few weeks. These instructions provide you with information on caring for yourself after your procedure. Your caregiver may also give you more specific instructions. Your treatment has been planned according to current medical practices, but problems sometimes occur. Call your caregiver if you have any problems or questions after your procedure. HOME CARE INSTRUCTIONS  You may shower the day after the procedure.Remove the bandage (dressing) and gently wash the site with plain soap and water.Gently pat the site dry.  Do not apply powder or lotion to the site.  Do not submerge the affected site in water for 3 to 5 days.  Inspect the site at least twice daily.  Do not flex or bend the affected arm for 24 hours.  No lifting over 5 pounds (2.3 kg) for 5 days after your procedure.  Do not drive home if you are discharged the same day of the procedure. Have someone else drive you.  You may drive 24 hours after the procedure unless otherwise instructed by your caregiver.  Do not operate machinery or power tools for 24 hours.  A responsible adult should be with you for the first 24 hours after you arrive home. What to expect:  Any bruising will usually fade within 1 to 2 weeks.  Blood that collects in the tissue (hematoma) may be painful to the touch. It should usually decrease in size and tenderness within 1 to 2 weeks. SEEK IMMEDIATE MEDICAL CARE IF:  You have unusual pain at the radial site.  You have redness, warmth, swelling, or pain at the radial site.  You have drainage (other than a small amount of blood on the dressing).  You have chills.  You have a fever or persistent symptoms for more than 72 hours.  You have a fever and your symptoms suddenly get worse.  Your arm becomes pale, cool, tingly, or numb.  You have heavy bleeding from the site. Hold pressure on the site. Document Released: 12/17/2010 Document Revised:  02/06/2012 Document Reviewed: 12/17/2010 [facility] Patient Information 2015 [place], [place]. This information is not intended to replace advice given to you by your health care provider. Make sure you discuss any questions you have with your health care provider.  Return To Work __________________________________________________ was treated at our facility. 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 Your employer should be aware of your condition and can help with the necessary work activity restrictions. If you wish to return to work sooner than the date above, or if you have further problems which make it difficult for you to return at that time, please call us or your caregiver. _________________________________________ Physician Name (Printed) _________________________________________ Physician Signature  _________________________________________ Date Document Released: 11/14/2005 Document Revised: 02/06/2012 Document Reviewed: 05/01/2007 [employer] Patient Information 2015 [place], [place]. This information is not intended to replace advice given to you by your health care provider. Make sure you discuss any questions you have with your health care provider.

## 2014-11-06 ENCOUNTER — Encounter (HOSPITAL_COMMUNITY): Payer: Self-pay | Admitting: Cardiology

## 2017-03-03 ENCOUNTER — Encounter: Payer: Self-pay | Admitting: Gastroenterology

## 2017-03-15 ENCOUNTER — Ambulatory Visit: Payer: 59 | Attending: Rheumatology

## 2017-03-15 DIAGNOSIS — M069 Rheumatoid arthritis, unspecified: Secondary | ICD-10-CM | POA: Diagnosis present

## 2017-03-15 NOTE — Therapy (Signed)
[place], [place], [address] Phone: [phone]   Fax:  [phone]  Physical Therapy Evaluation/FCE  Patient Details  Name: Kim Braun MRN: [id number] Date of Birth: Jul 19, 1961 Referring Provider: Lahoma Rocker, MD  Encounter Date: 03/15/2017      PT End of Session - 03/15/17 1632    Visit Number 1   Number of Visits 1   Authorization Type [employer]   PT Start Time 1250   PT Stop Time 1550   PT Time Calculation (min) 180 min   Activity Tolerance Patient limited by pain   Behavior During Therapy [facility] for tasks assessed/performed      Past Medical History:  Diagnosis Date  . Gestational diabetes   . Heart murmur    child  . Hypertension   . Left bundle branch block    11/2012 low risk, no ischemia, NL EF stress test (Dr. Marlou Porch)  . Rheumatoid arthritis(714.0)   . Smoker   . Thyroid nodule     Past Surgical History:  Procedure Laterality Date  . ABDOMINAL HYSTERECTOMY  12/19/2012   Procedure: HYSTERECTOMY ABDOMINAL;  Surgeon: Maeola Sarah. Landry Mellow, MD;  Location: [facility];  Service: Gynecology;  Laterality: N/A;  Incision @ 2426  . ANTERIOR CERVICAL DECOMP/DISCECTOMY FUSION N/A 07/17/2013   Procedure: Cervical four-five, Cervical five-six Anterior cervical decompression/diskectomy/fusion;  Surgeon: Floyce Stakes, MD;  Location: [place] [facility];  Service: Neurosurgery;  Laterality: N/A;  Cervical four-five, Cervical five-six Anterior cervical decompression/diskectomy/fusion  . BILATERAL SALPINGECTOMY  12/19/2012   Procedure: BILATERAL SALPINGECTOMY;  Surgeon: Maeola Sarah. Landry Mellow, MD;  Location: [facility];  Service: Gynecology;  Laterality: Bilateral;  . CARDIAC CATHETERIZATION  09/30/2014   Procedure: LEFT HEART CATH AND CORONARY ANGIOGRAPHY;  Surgeon: Laverda Page, MD;  Location: [facility] CATH LAB;  Service: Cardiovascular;;  . CESAREAN SECTION    . COLON SURGERY  age 57  . CYSTOSCOPY  12/19/2012   Procedure:  CYSTOSCOPY;  Surgeon: Maeola Sarah. Landry Mellow, MD;  Location: [facility];  Service: Gynecology;  Laterality: N/A;  . ROBOTIC ASSISTED TOTAL HYSTERECTOMY  12/19/2012   Procedure: ROBOTIC ASSISTED TOTAL HYSTERECTOMY;  Surgeon: Maeola Sarah. Landry Mellow, MD;  Location: [facility];  Service: Gynecology;  Laterality: N/A;  Attempted Robotic Total Hysterectomy    There were no vitals filed for this visit.       Subjective Assessment - 03/15/17 1629    Subjective See FCE             [facility] PT Assessment - 03/15/17 0001      Assessment   Medical Diagnosis Rhematoid Arthritis   Referring Provider Lahoma Rocker, MD                                       Plan - 03/15/17 1634    Clinical Impression Statement Ms Aker finished the FCE testing with increased pain but was able to completd the proccess.  The overall level of work  was Sedentary but this is the minimum level  due to inconsistencies and self limiting. Sedentary work involves significant sitting and she chose to stand most of the FCE and sat only when required.  See FCE report for details   PT Next Visit Plan No follow up post FCE. Report will be faxed to Dr Hope Pigeon and Agree with Plan of Care Patient      Patient  will benefit from skilled therapeutic intervention in order to improve the following deficits and impairments:     Visit Diagnosis: Rheumatoid arthritis involving multiple sites, unspecified rheumatoid factor presence [facility])     Problem List Patient Active Problem List   Diagnosis Date Noted  . Shortness of breath 09/28/2014  . Congestive dilated cardiomyopathy ([place]) 09/28/2014  . Fibroids 12/19/2012  . ABDOMINAL PAIN, ACUTE 05/23/2007  . GOUT 02/26/2007  . HYPERTENSION 02/26/2007  . GERD 02/26/2007    Darrel Hoover  PT 03/15/2017, 4:42 PM  [place] [address] [place], [place], [address] Phone: [phone]   Fax:  [phone]  Name:  Kim Braun MRN: [id number] Date of Birth: 17-Oct-1961 PHYSICAL THERAPY DISCHARGE SUMMARY  Visits from Start of Care: FCE only  Current functional level related to goals / functional outcomes: NA   Remaining deficits: NA   Education / Equipment: NA Plan: Patient agrees to discharge.  Patient goals were not met. Patient is being discharged due to                                                     ?????   Completing  FCE testing

## 2017-03-20 ENCOUNTER — Telehealth: Payer: Self-pay

## 2017-03-20 NOTE — Telephone Encounter (Signed)
Spoke with pt and assured we would make sure report was at MD office as she stated when she spoke to office they did not have info. She also reported she was still sore from testing. Asked her take her meds and contact MD if needed for her pain

## 2017-05-12 ENCOUNTER — Encounter: Payer: Self-pay | Admitting: Gastroenterology

## 2017-06-06 ENCOUNTER — Encounter: Payer: Self-pay | Admitting: Adult Health

## 2017-12-07 ENCOUNTER — Encounter: Payer: Self-pay | Admitting: Neurology

## 2017-12-07 ENCOUNTER — Encounter (INDEPENDENT_AMBULATORY_CARE_PROVIDER_SITE_OTHER): Payer: Self-pay

## 2017-12-07 ENCOUNTER — Ambulatory Visit: Payer: BLUE CROSS/BLUE SHIELD | Admitting: Neurology

## 2017-12-07 VITALS — BP 152/90 | HR 79 | Ht 63.0 in | Wt 114.0 lb

## 2017-12-07 DIAGNOSIS — R413 Other amnesia: Secondary | ICD-10-CM

## 2017-12-07 DIAGNOSIS — R7989 Other specified abnormal findings of blood chemistry: Secondary | ICD-10-CM | POA: Diagnosis not present

## 2017-12-07 NOTE — Progress Notes (Signed)
PATIENT: Kim Braun DOB: 1961/07/31  Chief Complaint  Patient presents with  . New Patient (Initial Visit)    Memory Loss - MMSE 24/30 - animals: 8.  . PCP    Alvester Chou, NP     HISTORICAL  Kim Braun is a 57 year old female, seen in refer by primary care nurse practitioner Alvester Chou for evaluation of memory loss, difficulty focusing, initial evaluation was on December 07, 2017.    Reviewed and summarized the history, She had a history of hypertension, heavy alcohol use for more than 40 years, eventually she has to go through alcohol rehabilitation in May 2018, at that time, she drinks up to 1pint of hard liquor every day, she lost her job from the [employer] from previous job.  She started this new job since October 2018, she describes difficulty focusing, making mistakes at her jobs, sometimes run through a stoplight and red light.   She is very frustrated and scared by her symptoms, she lives with her husband, complains of strained relationship,  Mother suffered dementia 77s, she also complains of mild intermittent unsteady gait, left leg tends to give out underneath her  REVIEW OF SYSTEMS: Full 14 system review of systems performed and notable only for rash, cough, wheezing, snoring, easy bruising, memory loss, confusion, dizziness, sleepiness, not enough sleep, decreased energy  ALLERGIES: No Known Allergies  HOME MEDICATIONS: Current Outpatient Medications  Medication Sig Dispense Refill  . albuterol (PROVENTIL HFA;VENTOLIN HFA) 108 (90 BASE) MCG/ACT inhaler Inhale 2 puffs into the lungs daily as needed for wheezing or shortness of breath.    Marland Kitchen aspirin EC 81 MG tablet Take 81 mg by mouth daily.     . Certolizumab Pegol (CIMZIA [place]) Inject into the skin every 14 (fourteen) days. Last administered on 09/18/14.    . Ergocalciferol (VITAMIN D2) 2000 UNITS TABS Take 2,000 Units by mouth daily.    . Ipratropium-Albuterol (COMBIVENT) 20-100 MCG/ACT AERS respimat  Inhale 1 puff into the lungs every 6 (six) hours.    Marland Kitchen losartan (COZAAR) 100 MG tablet Take 100 mg by mouth daily.  5   No current facility-administered medications for this visit.     PAST MEDICAL HISTORY: Past Medical History:  Diagnosis Date  . Gestational diabetes   . Heart murmur    child  . Hypertension   . Left bundle branch block    11/2012 low risk, no ischemia, NL EF stress test (Dr. Marlou Porch)  . Rheumatoid arthritis(714.0)   . Smoker   . Thyroid nodule     PAST SURGICAL HISTORY: Past Surgical History:  Procedure Laterality Date  . ABDOMINAL HYSTERECTOMY  12/19/2012   Procedure: HYSTERECTOMY ABDOMINAL;  Surgeon: Maeola Sarah. Landry Mellow, MD;  Location: [facility];  Service: Gynecology;  Laterality: N/A;  Incision @ 3419  . ANTERIOR CERVICAL DECOMP/DISCECTOMY FUSION N/A 07/17/2013   Procedure: Cervical four-five, Cervical five-six Anterior cervical decompression/diskectomy/fusion;  Surgeon: Floyce Stakes, MD;  Location: [place] [facility];  Service: Neurosurgery;  Laterality: N/A;  Cervical four-five, Cervical five-six Anterior cervical decompression/diskectomy/fusion  . BILATERAL SALPINGECTOMY  12/19/2012   Procedure: BILATERAL SALPINGECTOMY;  Surgeon: Maeola Sarah. Landry Mellow, MD;  Location: [facility];  Service: Gynecology;  Laterality: Bilateral;  . CARDIAC CATHETERIZATION  09/30/2014   Procedure: LEFT HEART CATH AND CORONARY ANGIOGRAPHY;  Surgeon: Laverda Page, MD;  Location: [facility];  Service: Cardiovascular;;  . CESAREAN SECTION    . COLON SURGERY  age 60  . CYSTOSCOPY  12/19/2012  Procedure: CYSTOSCOPY;  Surgeon: Maeola Sarah. Landry Mellow, MD;  Location: [facility];  Service: Gynecology;  Laterality: N/A;  . ROBOTIC ASSISTED TOTAL HYSTERECTOMY  12/19/2012   Procedure: ROBOTIC ASSISTED TOTAL HYSTERECTOMY;  Surgeon: Maeola Sarah. Landry Mellow, MD;  Location: [facility];  Service: Gynecology;  Laterality: N/A;  Attempted Robotic Total Hysterectomy    FAMILY HISTORY: Family History  Problem Relation Age of Onset  . Hypertension  Brother   . Hypertension Brother   . Hypertension Sister     SOCIAL HISTORY:  Social History   Socioeconomic History  . Marital status: Married    Spouse name: Not on file  . Number of children: Not on file  . Years of education: Not on file  . Highest education level: Not on file  Social Needs  . Financial resource strain: Not on file  . Food insecurity - worry: Not on file  . Food insecurity - inability: Not on file  . Transportation needs - medical: Not on file  . Transportation needs - non-medical: Not on file  Occupational History  . Not on file  Tobacco Use  . Smoking status: Current Every Day Smoker    Packs/day: 1.00    Years: 40.00    Pack years: 40.00    Types: Cigarettes  . Smokeless tobacco: Never Used  Substance and Sexual Activity  . Alcohol use: Yes    Alcohol/week: 0.0 oz    Types: 14 - 24 Cans of beer per week  . Drug use: No  . Sexual activity: Not on file  Other Topics Concern  . Not on file  Social History Narrative  . Not on file     PHYSICAL EXAM   Vitals:   12/07/17 0830  BP: (!) 152/90  Pulse: 79  Weight: 114 lb (51.7 kg)  Height: 5\' 3"  (1.6 m)    Not recorded      Body mass index is 20.19 kg/m.  PHYSICAL EXAMNIATION:  Gen: NAD, conversant, well nourised, obese, well groomed                     Cardiovascular: Regular rate rhythm, no peripheral edema, warm, nontender. Eyes: Conjunctivae clear without exudates or hemorrhage Neck: Supple, no carotid bruits. Pulmonary: Clear to auscultation bilaterally   NEUROLOGICAL EXAM: MMSE - Mini Mental State Exam 12/07/2017  Orientation to time 5  Orientation to Place 5  Registration 3  Attention/ Calculation 0  Recall 2  Language- name 2 objects 2  Language- repeat 1  Language- follow 3 step command 3  Language- read & follow direction 1  Write a sentence 1  Copy design 1  Total score 24     CRANIAL NERVES: CN II: Visual fields are full to confrontation. Fundoscopic exam is  normal with sharp discs and no vascular changes. Pupils are round equal and briskly reactive to light. CN III, IV, VI: extraocular movement are normal. No ptosis. CN V: Facial sensation is intact to pinprick in all 3 divisions bilaterally. Corneal responses are intact.  CN VII: Face is symmetric with normal eye closure and smile. CN VIII: Hearing is normal to rubbing fingers CN IX, X: Palate elevates symmetrically. Phonation is normal. CN XI: Head turning and shoulder shrug are intact CN XII: Tongue is midline with normal movements and no atrophy.  MOTOR: There is no pronator drift of out-stretched arms. Muscle bulk and tone are normal. Muscle strength is normal.  REFLEXES: Reflexes are 2+ and symmetric at the biceps, triceps, knees,  and ankles. Plantar responses are flexor.  SENSORY: Intact to light touch, pinprick, positional sensation and vibratory sensation are intact in fingers and toes.  COORDINATION: Rapid alternating movements and fine finger movements are intact. There is no dysmetria on finger-to-nose and heel-knee-shin.    GAIT/STANCE: Posture is normal. Gait is steady with normal steps, base, arm swing, and turning. Heel and toe walking are normal. Tandem gait is normal.  Romberg is absent.   DIAGNOSTIC DATA (LABS, IMAGING, TESTING) - I reviewed patient records, labs, notes, testing and imaging myself where available.   ASSESSMENT AND PLAN  Kim Braun is a 57 y.o. female   Mild cognitive impairment  History of heavy alcohol abuse, status post rehabilitation May 2018  Mini-Mental status examination 24/30   Mother suffered dementia  Complete evaluation with laboratory evaluations, MRI of the brain   Marcial Pacas, M.D. Ph.D.  [facility] [address], [place], [place] [address] Ph: [phone] Fax: [phone]  CC: Alvester Chou, NP

## 2017-12-08 ENCOUNTER — Telehealth: Payer: Self-pay | Admitting: Neurology

## 2017-12-08 ENCOUNTER — Encounter: Payer: Self-pay | Admitting: Neurology

## 2017-12-08 DIAGNOSIS — R7989 Other specified abnormal findings of blood chemistry: Secondary | ICD-10-CM

## 2017-12-08 LAB — COMPREHENSIVE METABOLIC PANEL
ALT: 18 IU/L (ref 0–32)
AST: 20 IU/L (ref 0–40)
Albumin/Globulin Ratio: 1.5 (ref 1.2–2.2)
Albumin: 4.3 g/dL (ref 3.5–5.5)
Alkaline Phosphatase: 79 IU/L (ref 39–117)
BUN/Creatinine Ratio: 14 (ref 9–23)
BUN: 8 mg/dL (ref 6–24)
Bilirubin Total: 0.5 mg/dL (ref 0.0–1.2)
CO2: 24 mmol/L (ref 20–29)
Calcium: 9.6 mg/dL (ref 8.7–10.2)
Chloride: 103 mmol/L (ref 96–106)
Creatinine, Ser: 0.57 mg/dL (ref 0.57–1.00)
GFR calc Af Amer: 118 mL/min/{1.73_m2} (ref >59)
GFR calc non Af Amer: 103 mL/min/{1.73_m2} (ref >59)
Globulin, Total: 2.8 g/dL (ref 1.5–4.5)
Glucose: 119 mg/dL — ABNORMAL HIGH (ref 65–99)
Potassium: 3.8 mmol/L (ref 3.5–5.2)
Sodium: 142 mmol/L (ref 134–144)
Total Protein: 7.1 g/dL (ref 6.0–8.5)

## 2017-12-08 LAB — CBC WITH DIFFERENTIAL/PLATELET
Basophils Absolute: 0 10*3/uL (ref 0.0–0.2)
Basos: 0 %
EOS (ABSOLUTE): 0.1 10*3/uL (ref 0.0–0.4)
Eos: 1 %
Hematocrit: 43 % (ref 34.0–46.6)
Hemoglobin: 14.4 g/dL (ref 11.1–15.9)
Immature Grans (Abs): 0 10*3/uL (ref 0.0–0.1)
Immature Granulocytes: 0 %
Lymphocytes Absolute: 2.5 10*3/uL (ref 0.7–3.1)
Lymphs: 29 %
MCH: 30.6 pg (ref 26.6–33.0)
MCHC: 33.5 g/dL (ref 31.5–35.7)
MCV: 92 fL (ref 79–97)
Monocytes Absolute: 0.8 10*3/uL (ref 0.1–0.9)
Monocytes: 9 %
Neutrophils Absolute: 5.2 10*3/uL (ref 1.4–7.0)
Neutrophils: 61 %
Platelets: 327 10*3/uL (ref 150–379)
RBC: 4.7 x10E6/uL (ref 3.77–5.28)
RDW: 14.2 % (ref 12.3–15.4)
WBC: 8.5 10*3/uL (ref 3.4–10.8)

## 2017-12-08 LAB — HIV ANTIBODY (ROUTINE TESTING W REFLEX): HIV Screen 4th Generation wRfx: NONREACTIVE

## 2017-12-08 LAB — RPR: RPR Ser Ql: NONREACTIVE

## 2017-12-08 LAB — VITAMIN B12: Vitamin B-12: 605 pg/mL (ref 232–1245)

## 2017-12-08 LAB — TSH: TSH: 0.271 u[IU]/mL — ABNORMAL LOW (ref 0.450–4.500)

## 2017-12-08 LAB — VITAMIN D 25 HYDROXY (VIT D DEFICIENCY, FRACTURES): Vit D, 25-Hydroxy: 38.3 ng/mL (ref 30.0–100.0)

## 2017-12-08 LAB — FOLATE: Folate: 9.1 ng/mL (ref >3.0)

## 2017-12-08 LAB — ANA W/REFLEX: Anti Nuclear Antibody(ANA): NEGATIVE

## 2017-12-08 LAB — C-REACTIVE PROTEIN: CRP: 1.8 mg/L (ref 0.0–4.9)

## 2017-12-08 NOTE — Telephone Encounter (Signed)
Please call patient, laboratory evaluation showed decreased TSH, which could indicate hyperthyroidism  I have ordered repeat thyroid function test  Rest of the laboratory evaluation showed no significant abnormality.

## 2017-12-08 NOTE — Telephone Encounter (Signed)
LMTC for lab results/fim 

## 2017-12-11 NOTE — Telephone Encounter (Signed)
I spoke to the patient to schedule her MRI. She is scheduled for tomorrow 12/12/17 at our [place] mobile unit to have the MRI done. She also stated that she missed a call about her lab results. She stated she can be connected in the next 45 mins on her cell phone because she is at lunch.

## 2017-12-11 NOTE — Telephone Encounter (Signed)
Left message requesting a return call.

## 2017-12-11 NOTE — Telephone Encounter (Signed)
LMOM for Kim Braun that one thyroid test was a little low, meaning she may have hyperthyroidism.  If she can come to our office during business hours (M-Th 8am-5pm and Friday 8am-12pm, avoiding 12p-1p since lab tech is at lunch sometime during that hour) and have additional lab drawn.  Will call her with the results/fim

## 2017-12-12 ENCOUNTER — Ambulatory Visit: Payer: BLUE CROSS/BLUE SHIELD

## 2017-12-12 DIAGNOSIS — R413 Other amnesia: Secondary | ICD-10-CM | POA: Diagnosis not present

## 2017-12-12 NOTE — Addendum Note (Signed)
Addended by: Inis Sizer D on: 12/12/2017 08:42 AM   Modules accepted: Orders

## 2017-12-13 ENCOUNTER — Telehealth: Payer: Self-pay | Admitting: Neurology

## 2017-12-13 ENCOUNTER — Telehealth: Payer: Self-pay | Admitting: *Deleted

## 2017-12-13 LAB — THYROID PANEL WITH TSH
Free Thyroxine Index: 1.8 (ref 1.2–4.9)
T3 Uptake Ratio: 31 % (ref 24–39)
T4, Total: 5.9 ug/dL (ref 4.5–12.0)
TSH: 0.299 u[IU]/mL — ABNORMAL LOW (ref 0.450–4.500)

## 2017-12-13 NOTE — Telephone Encounter (Signed)
Please call patient, repeat laboratory evaluation continues to demonstrate mild TSH, but was normal T3, T4 level, rest of the laboratory evaluation showed no significant abnormality,  She should continue monitoring her thyroid level through her primary care physician

## 2017-12-13 NOTE — Telephone Encounter (Signed)
-----   Message from Marcial Pacas, MD sent at 12/13/2017 11:10 AM EST ----- Please call pt for normal MRI brain.

## 2017-12-13 NOTE — Telephone Encounter (Signed)
Spoke to patient - she is aware of results and will continue to have it monitored through her PCP.

## 2017-12-13 NOTE — Telephone Encounter (Signed)
Left patient a detailed message, with results, on voicemail (ok per DPR).  Provided our number to call back with any questions.  

## 2017-12-13 NOTE — Telephone Encounter (Signed)
Pt is returning call.  

## 2018-01-04 DIAGNOSIS — Z0271 Encounter for disability determination: Secondary | ICD-10-CM

## 2018-01-11 ENCOUNTER — Ambulatory Visit: Payer: BLUE CROSS/BLUE SHIELD | Admitting: Neurology

## 2018-01-11 ENCOUNTER — Encounter: Payer: Self-pay | Admitting: Neurology

## 2018-01-11 VITALS — BP 124/83 | HR 94 | Ht 63.0 in | Wt 113.0 lb

## 2018-01-11 DIAGNOSIS — R413 Other amnesia: Secondary | ICD-10-CM | POA: Diagnosis not present

## 2018-01-11 NOTE — Progress Notes (Signed)
PATIENT: Kim Braun DOB: 03-04-1961  Chief Complaint  Patient presents with  . New Patient (Initial Visit)    Memory Loss - MMSE 24/30 - animals: 8.  . PCP    Alvester Chou, NP     HISTORICAL  Kim Braun is a 57 year old female, seen in refer by primary care nurse practitioner Alvester Chou for evaluation of memory loss, difficulty focusing, initial evaluation was on December 07, 2017.    Reviewed and summarized the history, She had a history of hypertension, heavy alcohol use for more than 40 years, eventually she has to go through alcohol rehabilitation in May 2018, at that time, she drinks up to 1pint of hard liquor every day, she lost her job from the [employer] from previous job.  She started this new job since October 2018, she describes difficulty focusing, making mistakes at her jobs, sometimes run through a stoplight and red light.   She is very frustrated and scared by her symptoms, she lives with her husband, complains of strained relationship,  Mother suffered dementia 40s, she also complains of mild intermittent unsteady gait, left leg tends to give out underneath her  UPDATE Jan 11 2018: She is accompanied by her husband at today's clinical visit, both of them reported extreme stress.  Her job review stated that her performance was unsatisfactory, she has trouble retaining information, has to be re-trained for the basic job task.  She was let go of previous job in Oct 2017 for similar reasons,   she complains of joints pain.  She has strong family history of dementia, her mother, and maternal uncle suffer dementia.  She also complains of unexplained passing out on December 31, 2017 while cleaning her car windows, she did not feel good, woke up on the floor, apparently had transient loss of consciousness,  MRI of the brain was normal Laboratory evaluation showed mild elevated TSH, within normal range T4, T3, normal vitamin D level, normal or negative HIV,  C-reactive protein, folic acid, vitamin C58, RPR, ANA, CBC, CMP with mild elevated glucose 119.   REVIEW OF SYSTEMS: Full 14 system review of systems performed and notable only for : Ringing the ears, joint pain, swelling, achy muscles, neck pain, stiffness, bruise easily, memory loss, dizziness, passing out, depression  ALLERGIES: No Known Allergies  HOME MEDICATIONS: Current Outpatient Medications  Medication Sig Dispense Refill  . albuterol (PROVENTIL HFA;VENTOLIN HFA) 108 (90 BASE) MCG/ACT inhaler Inhale 2 puffs into the lungs daily as needed for wheezing or shortness of breath.    Marland Kitchen aspirin EC 81 MG tablet Take 81 mg by mouth daily.     . Certolizumab Pegol (CIMZIA [place]) Inject into the skin every 14 (fourteen) days. Last administered on 09/18/14.    . Ergocalciferol (VITAMIN D2) 2000 UNITS TABS Take 2,000 Units by mouth daily.    . Ipratropium-Albuterol (COMBIVENT) 20-100 MCG/ACT AERS respimat Inhale 1 puff into the lungs every 6 (six) hours.    Marland Kitchen losartan (COZAAR) 100 MG tablet Take 100 mg by mouth daily.  5   No current facility-administered medications for this visit.     PAST MEDICAL HISTORY: Past Medical History:  Diagnosis Date  . Gestational diabetes   . Heart murmur    child  . Hypertension   . Left bundle branch block    11/2012 low risk, no ischemia, NL EF stress test (Dr. Marlou Porch)  . Rheumatoid arthritis(714.0)   . Smoker   . Thyroid nodule  PAST SURGICAL HISTORY: Past Surgical History:  Procedure Laterality Date  . ABDOMINAL HYSTERECTOMY  12/19/2012   Procedure: HYSTERECTOMY ABDOMINAL;  Surgeon: Maeola Sarah. Landry Mellow, MD;  Location: [facility];  Service: Gynecology;  Laterality: N/A;  Incision @ 4481  . ANTERIOR CERVICAL DECOMP/DISCECTOMY FUSION N/A 07/17/2013   Procedure: Cervical four-five, Cervical five-six Anterior cervical decompression/diskectomy/fusion;  Surgeon: Floyce Stakes, MD;  Location: [facility];  Service: Neurosurgery;  Laterality: N/A;  Cervical  four-five, Cervical five-six Anterior cervical decompression/diskectomy/fusion  . BILATERAL SALPINGECTOMY  12/19/2012   Procedure: BILATERAL SALPINGECTOMY;  Surgeon: Maeola Sarah. Landry Mellow, MD;  Location: [facility];  Service: Gynecology;  Laterality: Bilateral;  . CARDIAC CATHETERIZATION  09/30/2014   Procedure: LEFT HEART CATH AND CORONARY ANGIOGRAPHY;  Surgeon: Laverda Page, MD;  Location: [facility];  Service: Cardiovascular;;  . CESAREAN SECTION    . COLON SURGERY  age 68  . CYSTOSCOPY  12/19/2012   Procedure: CYSTOSCOPY;  Surgeon: Maeola Sarah. Landry Mellow, MD;  Location: [facility];  Service: Gynecology;  Laterality: N/A;  . ROBOTIC ASSISTED TOTAL HYSTERECTOMY  12/19/2012   Procedure: ROBOTIC ASSISTED TOTAL HYSTERECTOMY;  Surgeon: Maeola Sarah. Landry Mellow, MD;  Location: [facility];  Service: Gynecology;  Laterality: N/A;  Attempted Robotic Total Hysterectomy    FAMILY HISTORY: Family History  Problem Relation Age of Onset  . Hypertension Brother   . Hypertension Brother   . Hypertension Sister     SOCIAL HISTORY:  Social History   Socioeconomic History  . Marital status: Married    Spouse name: Not on file  . Number of children: Not on file  . Years of education: Not on file  . Highest education level: Not on file  Social Needs  . Financial resource strain: Not on file  . Food insecurity - worry: Not on file  . Food insecurity - inability: Not on file  . Transportation needs - medical: Not on file  . Transportation needs - non-medical: Not on file  Occupational History  . Not on file  Tobacco Use  . Smoking status: Current Every Day Smoker    Packs/day: 1.00    Years: 40.00    Pack years: 40.00    Types: Cigarettes  . Smokeless tobacco: Never Used  Substance and Sexual Activity  . Alcohol use: Yes    Alcohol/week: 0.0 oz    Types: 14 - 24 Cans of beer per week  . Drug use: No  . Sexual activity: Not on file  Other Topics Concern  . Not on file  Social History Narrative  . Not on file      PHYSICAL EXAM   Vitals:   01/11/18 1254  BP: 124/83  Pulse: 94  Weight: 113 lb (51.3 kg)  Height: 5\' 3"  (1.6 m)    Not recorded      Body mass index is 20.02 kg/m.  PHYSICAL EXAMNIATION:  Gen: NAD, conversant, well nourised, obese, well groomed                     Cardiovascular: Regular rate rhythm, no peripheral edema, warm, nontender. Eyes: Conjunctivae clear without exudates or hemorrhage Neck: Supple, no carotid bruits. Pulmonary: Clear to auscultation bilaterally   NEUROLOGICAL EXAM: MMSE - Mini Mental State Exam 12/07/2017  Orientation to time 5  Orientation to Place 5  Registration 3  Attention/ Calculation 0  Recall 2  Language- name 2 objects 2  Language- repeat 1  Language- follow 3 step command 3  Language- read &  follow direction 1  Write a sentence 1  Copy design 1  Total score 24     CRANIAL NERVES: CN II: Visual fields are full to confrontation. Fundoscopic exam is normal with sharp discs and no vascular changes. Pupils are round equal and briskly reactive to light. CN III, IV, VI: extraocular movement are normal. No ptosis. CN V: Facial sensation is intact to pinprick in all 3 divisions bilaterally. Corneal responses are intact.  CN VII: Face is symmetric with normal eye closure and smile. CN VIII: Hearing is normal to rubbing fingers CN IX, X: Palate elevates symmetrically. Phonation is normal. CN XI: Head turning and shoulder shrug are intact CN XII: Tongue is midline with normal movements and no atrophy.  MOTOR: There is no pronator drift of out-stretched arms. Muscle bulk and tone are normal. Muscle strength is normal.  REFLEXES: Reflexes are 2+ and symmetric at the biceps, triceps, knees, and ankles. Plantar responses are flexor.  SENSORY: Intact to light touch, pinprick, positional sensation and vibratory sensation are intact in fingers and toes.  COORDINATION: Rapid alternating movements and fine finger movements are intact.  There is no dysmetria on finger-to-nose and heel-knee-shin.    GAIT/STANCE: Posture is normal. Gait is steady with normal steps, base, arm swing, and turning. Heel and toe walking are normal. Tandem gait is normal.  Romberg is absent.   DIAGNOSTIC DATA (LABS, IMAGING, TESTING) - I reviewed patient records, labs, notes, testing and imaging myself where available.   ASSESSMENT AND PLAN  Kim Braun is a 57 y.o. female   Mild cognitive impairment  History of heavy alcohol abuse, status post rehabilitation May 2018  Mini-Mental status examination was 24/30   Mother suffered dementia  MRI of the brain was normal in January 2019  Laboratory evaluation showed elevated TSH, but normal T3 and T4,  She does complains of extreme stress, which likely contributed to her current complaints of memory loss, cannot rule out possibility of underlying early-stage central nervous system degenerative disorder,  I have suggested EEG, neuropsychiatric evaluations, she wants to hold it off.    Marcial Pacas, M.D. Ph.D.  [facility] [address], [place], [place] [address] Ph: [phone] Fax: [phone]  CC: Alvester Chou, NP

## 2018-02-12 ENCOUNTER — Encounter: Payer: Self-pay | Admitting: *Deleted

## 2018-02-12 ENCOUNTER — Ambulatory Visit: Payer: BLUE CROSS/BLUE SHIELD | Admitting: Neurology

## 2018-02-19 ENCOUNTER — Encounter: Payer: Self-pay | Admitting: *Deleted

## 2018-02-19 ENCOUNTER — Telehealth: Payer: Self-pay | Admitting: Neurology

## 2018-02-19 DIAGNOSIS — R4189 Other symptoms and signs involving cognitive functions and awareness: Secondary | ICD-10-CM

## 2018-02-19 NOTE — Telephone Encounter (Signed)
Pt states a letter was done for work re: her memory.  Her job is asking for more, stating a letter is needed stating what she can and can not do re: doing something in reppition.  Letter should state she is unable to learn any new task . Pt is asking to be called re: this request, the initial letter was not satisfactory for her employer

## 2018-02-19 NOTE — Telephone Encounter (Signed)
Per Dr. Krista Blue, patient will need to have neuropsychiatric testing.  She is agreeable to this referral.  We will provide letter that she is waiting for this appointment for further evaluation.

## 2018-02-19 NOTE — Addendum Note (Signed)
Addended by: Noberto Retort C on: 02/19/2018 10:24 AM   Modules accepted: Orders

## 2018-02-19 NOTE — Telephone Encounter (Signed)
Spoke to patient and she wants to go [facility] For her neuropsychiatric testing. [facility] has openings for May

## 2018-02-19 NOTE — Telephone Encounter (Signed)
Called patient and left her asking her to call me back about her referral . Patient may have to travel if she want's a sooner apt.

## 2018-02-20 NOTE — Telephone Encounter (Signed)
Patient will see Dr. Ane Payment In [place] . Dr. Ane Payment will call Patient to schedule patient to be in April after she checks in insurance patient aware. Telephone [phone] - Fax [phone]

## 2018-04-11 ENCOUNTER — Telehealth: Payer: Self-pay | Admitting: Neurology

## 2018-04-11 NOTE — Telephone Encounter (Signed)
Patient is holding off on neuropsychiatric testing for now she just cant afford It.

## 2018-12-04 ENCOUNTER — Ambulatory Visit
Admission: RE | Admit: 2018-12-04 | Discharge: 2018-12-04 | Disposition: A | Discharge status: Home / Self Care | Payer: BLUE CROSS/BLUE SHIELD | Source: Ambulatory Visit | Attending: Adult Health | Admitting: Adult Health

## 2018-12-04 ENCOUNTER — Other Ambulatory Visit: Payer: Self-pay | Admitting: Adult Health

## 2018-12-04 DIAGNOSIS — R058 Other specified cough: Secondary | ICD-10-CM

## 2018-12-04 DIAGNOSIS — R05 Cough: Secondary | ICD-10-CM

## 2019-01-01 ENCOUNTER — Ambulatory Visit
Admission: RE | Admit: 2019-01-01 | Discharge: 2019-01-01 | Disposition: A | Discharge status: Home / Self Care | Payer: BLUE CROSS/BLUE SHIELD | Source: Ambulatory Visit | Attending: Adult Health | Admitting: Adult Health

## 2019-01-01 ENCOUNTER — Other Ambulatory Visit: Payer: Self-pay | Admitting: Adult Health

## 2019-01-01 DIAGNOSIS — R058 Other specified cough: Secondary | ICD-10-CM

## 2019-01-01 DIAGNOSIS — R05 Cough: Secondary | ICD-10-CM

## 2019-01-05 ENCOUNTER — Other Ambulatory Visit: Payer: Self-pay | Admitting: Adult Health

## 2019-01-27 DIAGNOSIS — J449 Chronic obstructive pulmonary disease, unspecified: Secondary | ICD-10-CM

## 2019-01-27 HISTORY — DX: Chronic obstructive pulmonary disease, unspecified: J44.9

## 2019-06-01 IMAGING — CR DG CHEST 2V
2 series · 2 of 2 positions shown · non-contrast
Comparison: 12/04/2018

CLINICAL DATA: Follow-up basilar infiltrate

EXAM:
CHEST - 2 VIEW

[w chest pa]
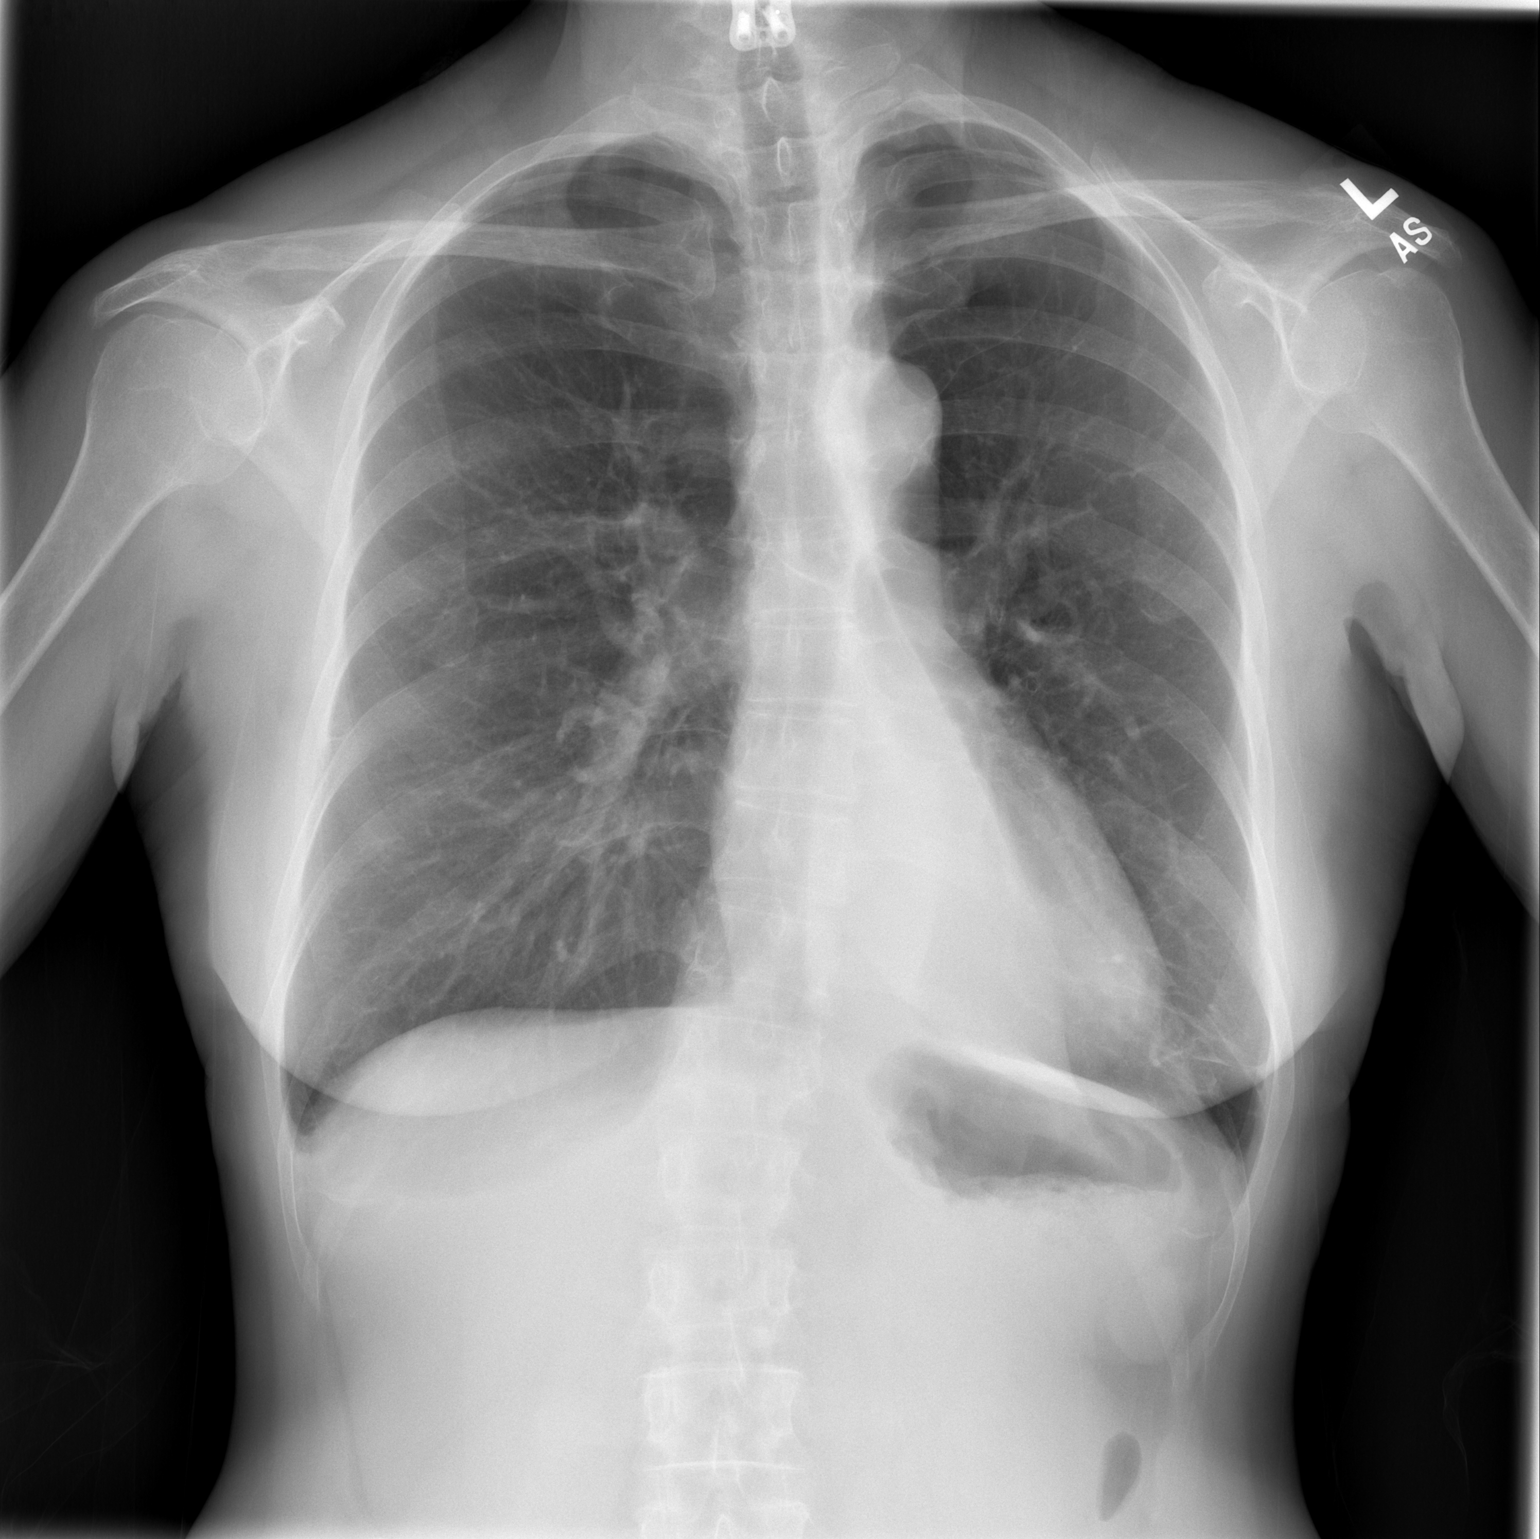

[w chest lat]
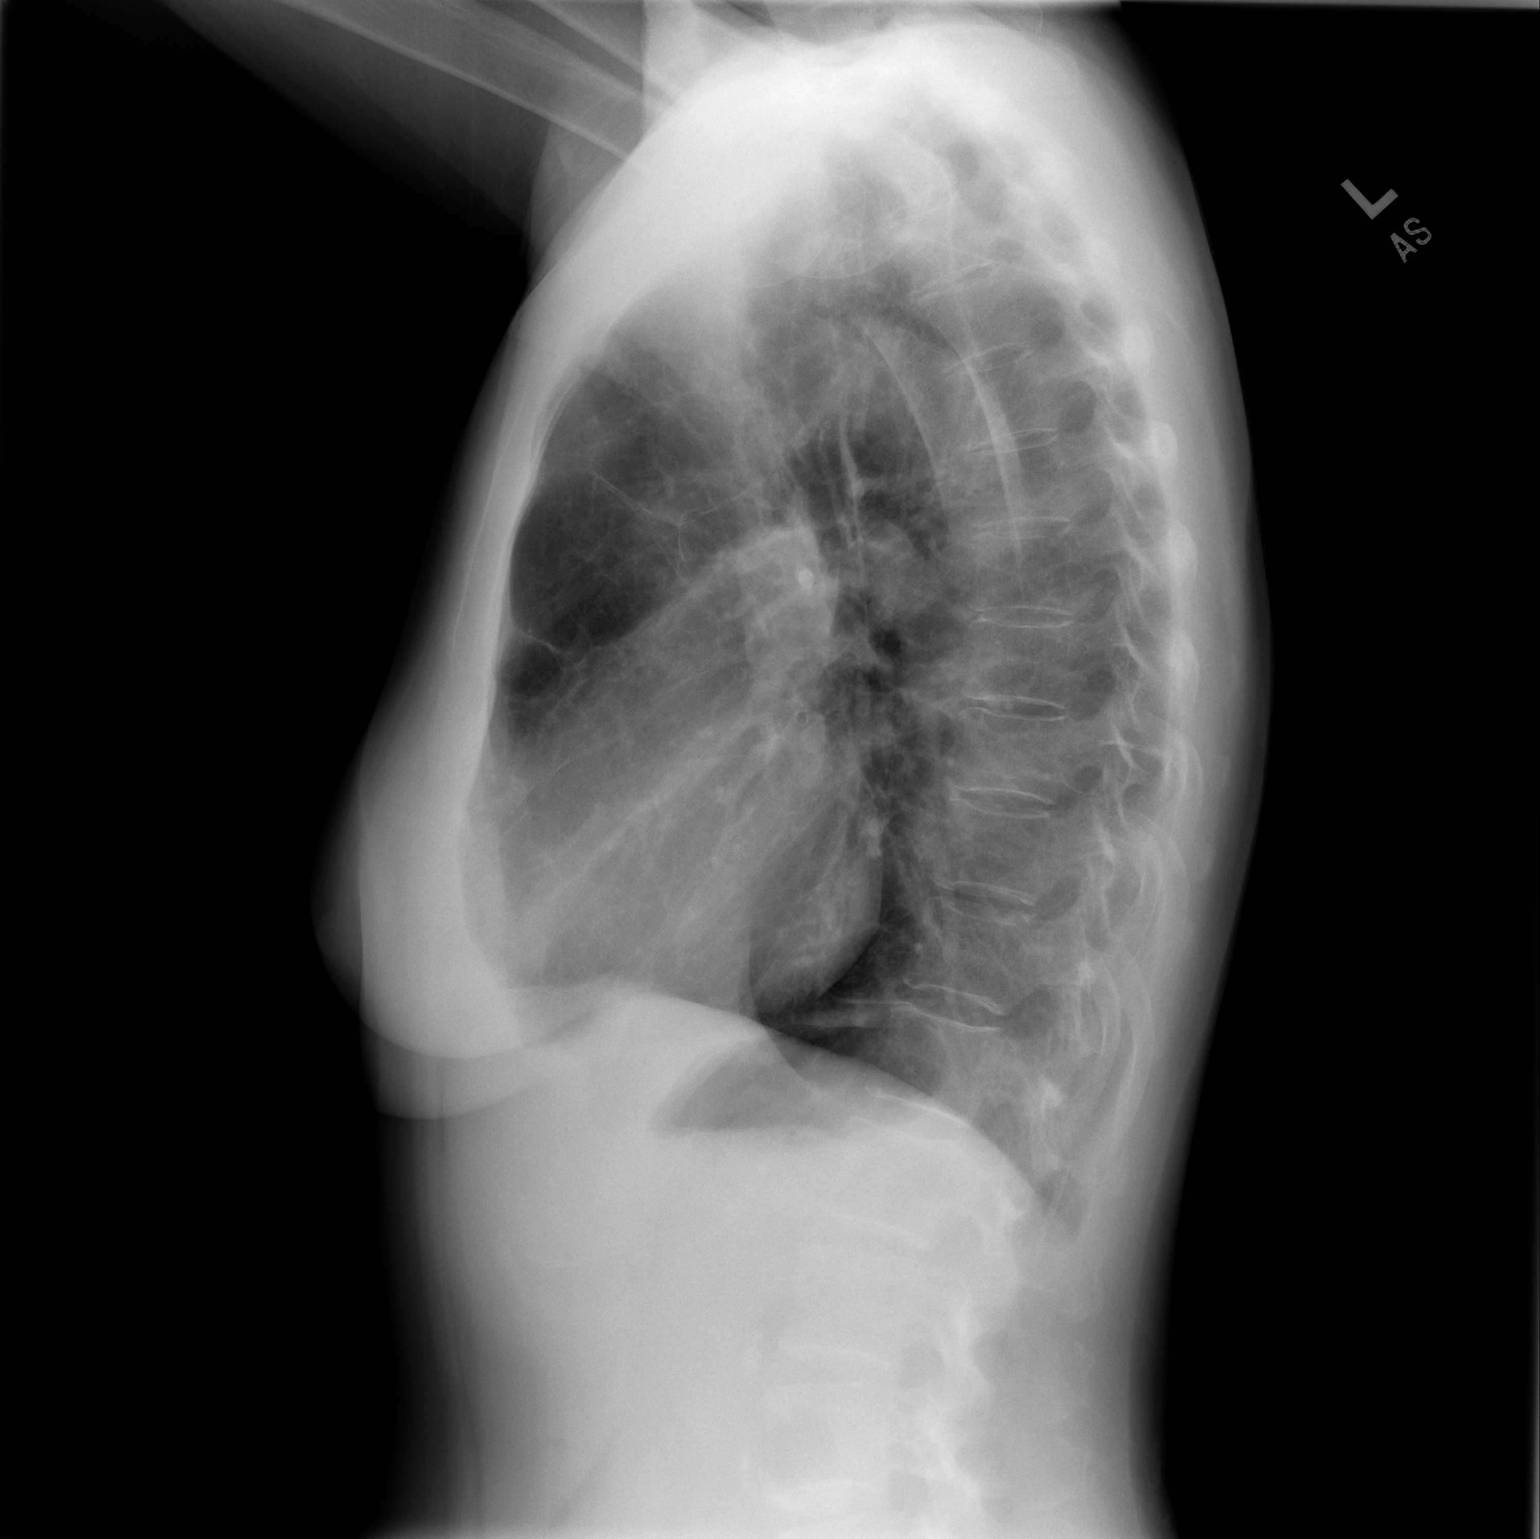

[2 of 2 positions shown; findings below may reference images not displayed]

FINDINGS: Cardiac shadow is stable. Increased left retrocardiac density is
noted consistent with medial left lower lobe consolidation. The
overall appearance is similar to that seen on the prior exam. Given
the stability is CT of the chest with contrast is recommended for
further evaluation. No other focal infiltrate is seen. No bony
abnormality is noted.
IMPRESSION: Persistent left basilar changes as described. CT of the chest is
recommended to rule out underlying obstructive process.

These results will be called to the ordering clinician or
representative by the Radiologist Assistant, and communication
documented in the PACS or zVision Dashboard.

## 2020-02-20 ENCOUNTER — Encounter (HOSPITAL_COMMUNITY): Payer: Self-pay

## 2020-02-20 ENCOUNTER — Other Ambulatory Visit: Payer: Self-pay

## 2020-02-20 ENCOUNTER — Ambulatory Visit (HOSPITAL_COMMUNITY)
Admission: EM | Admit: 2020-02-20 | Discharge: 2020-02-20 | Disposition: A | Discharge status: Home / Self Care | Payer: 59 | Attending: Family Medicine | Admitting: Family Medicine

## 2020-02-20 ENCOUNTER — Ambulatory Visit (INDEPENDENT_AMBULATORY_CARE_PROVIDER_SITE_OTHER): Payer: 59

## 2020-02-20 DIAGNOSIS — R1032 Left lower quadrant pain: Secondary | ICD-10-CM | POA: Insufficient documentation

## 2020-02-20 DIAGNOSIS — Z79899 Other long term (current) drug therapy: Secondary | ICD-10-CM | POA: Diagnosis not present

## 2020-02-20 DIAGNOSIS — R109 Unspecified abdominal pain: Secondary | ICD-10-CM

## 2020-02-20 DIAGNOSIS — Z7982 Long term (current) use of aspirin: Secondary | ICD-10-CM | POA: Diagnosis not present

## 2020-02-20 DIAGNOSIS — Z90710 Acquired absence of both cervix and uterus: Secondary | ICD-10-CM | POA: Diagnosis not present

## 2020-02-20 DIAGNOSIS — Z20822 Contact with and (suspected) exposure to covid-19: Secondary | ICD-10-CM | POA: Insufficient documentation

## 2020-02-20 DIAGNOSIS — M069 Rheumatoid arthritis, unspecified: Secondary | ICD-10-CM | POA: Insufficient documentation

## 2020-02-20 DIAGNOSIS — F17210 Nicotine dependence, cigarettes, uncomplicated: Secondary | ICD-10-CM | POA: Insufficient documentation

## 2020-02-20 DIAGNOSIS — R413 Other amnesia: Secondary | ICD-10-CM | POA: Insufficient documentation

## 2020-02-20 DIAGNOSIS — Z8249 Family history of ischemic heart disease and other diseases of the circulatory system: Secondary | ICD-10-CM | POA: Diagnosis not present

## 2020-02-20 DIAGNOSIS — Z981 Arthrodesis status: Secondary | ICD-10-CM | POA: Insufficient documentation

## 2020-02-20 DIAGNOSIS — R197 Diarrhea, unspecified: Secondary | ICD-10-CM | POA: Insufficient documentation

## 2020-02-20 DIAGNOSIS — M109 Gout, unspecified: Secondary | ICD-10-CM | POA: Insufficient documentation

## 2020-02-20 DIAGNOSIS — I10 Essential (primary) hypertension: Secondary | ICD-10-CM | POA: Insufficient documentation

## 2020-02-20 LAB — COMPREHENSIVE METABOLIC PANEL
ALT: 37 U/L (ref 0–44)
AST: 58 U/L — ABNORMAL HIGH (ref 15–41)
Albumin: 3.6 g/dL (ref 3.5–5.0)
Alkaline Phosphatase: 68 U/L (ref 38–126)
Anion gap: 12 (ref 5–15)
BUN: <5 mg/dL — ABNORMAL LOW (ref 8–23)
CO2: 25 mmol/L (ref 22–32)
Calcium: 9.1 mg/dL (ref 8.9–10.3)
Chloride: 103 mmol/L (ref 98–111)
Creatinine, Ser: 0.7 mg/dL (ref 0.44–1.00)
GFR calc Af Amer: >60 mL/min (ref >60)
GFR calc non Af Amer: >60 mL/min (ref >60)
Glucose, Bld: 80 mg/dL (ref 70–99)
Potassium: 3.4 mmol/L — ABNORMAL LOW (ref 3.5–5.1)
Sodium: 140 mmol/L (ref 135–145)
Total Bilirubin: 0.7 mg/dL (ref 0.3–1.2)
Total Protein: 7 g/dL (ref 6.5–8.1)

## 2020-02-20 LAB — CBC WITH DIFFERENTIAL/PLATELET
Abs Immature Granulocytes: 0.01 10*3/uL (ref 0.00–0.07)
Basophils Absolute: 0 10*3/uL (ref 0.0–0.1)
Basophils Relative: 1 %
Eosinophils Absolute: 0.1 10*3/uL (ref 0.0–0.5)
Eosinophils Relative: 1 %
HCT: 39.6 % (ref 36.0–46.0)
Hemoglobin: 13.4 g/dL (ref 12.0–15.0)
Immature Granulocytes: 0 %
Lymphocytes Relative: 27 %
Lymphs Abs: 1.8 10*3/uL (ref 0.7–4.0)
MCH: 31.9 pg (ref 26.0–34.0)
MCHC: 33.8 g/dL (ref 30.0–36.0)
MCV: 94.3 fL (ref 80.0–100.0)
Monocytes Absolute: 0.7 10*3/uL (ref 0.1–1.0)
Monocytes Relative: 11 %
Neutro Abs: 4 10*3/uL (ref 1.7–7.7)
Neutrophils Relative %: 60 %
Platelets: 268 10*3/uL (ref 150–400)
RBC: 4.2 MIL/uL (ref 3.87–5.11)
RDW: 13.1 % (ref 11.5–15.5)
WBC: 6.6 10*3/uL (ref 4.0–10.5)
nRBC: 0.3 % — ABNORMAL HIGH (ref 0.0–0.2)

## 2020-02-20 MED ORDER — TRAMADOL HCL 50 MG PO TABS: 50.0000 mg | ORAL_TABLET | Freq: Four times a day (QID) | ORAL | 0 refills | Status: AC | PRN

## 2020-02-20 MED ORDER — AMOXICILLIN-POT CLAVULANATE 875-125 MG PO TABS: 1.0000 | ORAL_TABLET | Freq: Two times a day (BID) | ORAL | 0 refills | Status: DC

## 2020-02-20 MED ORDER — ONDANSETRON 4 MG PO TBDP: 4.0000 mg | ORAL_TABLET | Freq: Three times a day (TID) | ORAL | 0 refills | Status: DC | PRN

## 2020-02-20 NOTE — ED Triage Notes (Signed)
Pt c/o acute onset abdom pain with diarrhea Sunday early morning. Pt states diarrhea has not improved with use of OTC immodium. C/o general weakness. Also reports emesis of bile and dry heaves. Taking sips of pedialyte.  C/o chills. Pt states she received her first COVID vaccine on 02/12/20 Denies dysuria sx, cough, HA, or sore throat  Denies nausea presently

## 2020-02-20 NOTE — Discharge Instructions (Addendum)
Your x-ray did not show anything concerning.  We will go and treat you for possible diverticulitis. Take the antibiotic as prescribed. I'm giving you tramadol for pain to use as needed. You can continue the Imodium. You should start taking a probiotic. Zofran as needed for nausea, vomiting

## 2020-02-21 LAB — SARS CORONAVIRUS 2 (TAT 6-24 HRS): SARS Coronavirus 2: NEGATIVE

## 2020-02-21 NOTE — ED Provider Notes (Signed)
[facility]    CSN: [id number] Arrival date & time: 02/20/20  0827      History   Chief Complaint Chief Complaint  Patient presents with  . Abdominal Pain  . Diarrhea    HPI Kim Braun is a 59 y.o. female.   Patient is a 59 year old female who presents today with approximate 5 days of acute onset abdominal pain with diarrhea.  Pain more in the left lower quadrant.  The pain comes in waves and is sharp and stabbing at times.  This has been constant.  She reports "I have had too many diarrhea episodes to count".  Describes the diarrhea as watery and semisolid at times.  Denies any blood in stool.  She has been generalized weak.  She is also had some emesis with bile and dry heaves.  She has been sipping Pedialyte to stay hydrated.  Some associated chills.  Received her first Covid vaccine on 02/12/2020.  Denies any urinary symptoms, cough, headache, sore throat, nasal congestion or rhinorrhea.  Denies any fever. Denies any hx of diverticulitis, but family hx. Reporting she has 2 alcoholic drinks a day. She has been taking imodium without much relief. Hx of hysterectomy. Has never had a colonoscopy.   ROS per HPI       Past Medical History:  Diagnosis Date  . Gestational diabetes   . Heart murmur    child  . Hypertension   . Left bundle branch block    11/2012 low risk, no ischemia, NL EF stress test (Dr. Marlou Porch)  . Rheumatoid arthritis(714.0)   . Smoker   . Thyroid nodule     Patient Active Problem List   Diagnosis Date Noted  . Memory loss 12/07/2017  . Shortness of breath 09/28/2014  . 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 Fibroids 12/19/2012  . ABDOMINAL PAIN, ACUTE 05/23/2007  . GOUT 02/26/2007  . HYPERTENSION 02/26/2007  . GERD 02/26/2007    Past Surgical History:  Procedure Laterality Date  . ABDOMINAL HYSTERECTOMY  12/19/2012   Procedure: HYSTERECTOMY ABDOMINAL;  Surgeon: Maeola Sarah. Landry Mellow, MD;  Location: [facility];  Service:  Gynecology;  Laterality: N/A;  Incision @ [id number]  . ANTERIOR CERVICAL DECOMP/DISCECTOMY FUSION N/A 07/17/2013   Procedure: Cervical four-five, Cervical five-six Anterior cervical decompression/diskectomy/fusion;  Surgeon: Floyce Stakes, MD;  Location: [facility];  Service: Neurosurgery;  Laterality: N/A;  Cervical four-five, Cervical five-six Anterior cervical decompression/diskectomy/fusion  . BILATERAL SALPINGECTOMY  12/19/2012   Procedure: BILATERAL SALPINGECTOMY;  Surgeon: Maeola Sarah. Landry Mellow, MD;  Location: [facility];  Service: Gynecology;  Laterality: Bilateral;  . CARDIAC CATHETERIZATION  09/30/2014   Procedure: LEFT HEART CATH AND CORONARY ANGIOGRAPHY;  Surgeon: Laverda Page, MD;  Location: [facility] C[facility] LAB;  Service: Cardiovascular;;  . CESAREAN SECTION    . COLON SURGERY  age 21  . CYSTOSCOPY  12/19/2012   Procedure: CYSTOSCOPY;  Surgeon: Maeola Sarah. Landry Mellow, MD;  Location: [facility];  Service: Gynecology;  Laterality: N/A;  . ROBOTIC ASSISTED TOTAL HYSTERECTOMY  12/19/2012   Procedure: ROBOTIC ASSISTED TOTAL HYSTERECTOMY;  Surgeon: Maeola Sarah. Landry Mellow, MD;  Location: [facility];  Service: Gynecology;  Laterality: N/A;  Attempted Robotic Total Hysterectomy    OB History   No obstetric history on file.      Home Medications    Prior to Admission medications   Medication Sig Start Date End Date Taking? Authorizing Provider  Adalimumab (HUMIRA PEN) 40 MG/0.4ML PNKT Inject 40mg  subcutaneously every *OTHER week as directed  02/05/20  Yes [provider]  albuterol (PROVENTIL HFA;VENTOLIN HFA) 108 (90 BASE) MCG/ACT inhaler Inhale 2 puffs into the lungs daily as needed for wheezing or shortness of breath.    [provider]  amoxicillin-clavulanate (AUGMENTIN) 875-125 MG tablet Take 1 tablet by mouth every 12 (twelve) hours. 02/20/20   Loura Halt A, NP  aspirin EC 81 MG tablet Take 81 mg by mouth daily.     [provider]  Certolizumab Pegol (CIMZIA [place]) Inject into the skin every 14  (fourteen) days. Last administered on 09/18/14.    [provider]  Ergocalciferol (VITAMIN D2) 2000 UNITS TABS Take 2,000 Units by mouth daily.    [provider]  Ipratropium-Albuterol (COMBIVENT) 20-100 MCG/ACT AERS respimat Inhale 1 puff into the lungs every 6 (six) hours.    [provider]  losartan (COZAAR) 100 MG tablet Take 100 mg by mouth daily. 11/10/17   [provider]  ondansetron (ZOFRAN ODT) 4 MG disintegrating tablet Take 1 tablet (4 mg total) by mouth every 8 (eight) hours as needed for nausea or vomiting. 02/20/20   Loura Halt A, NP  traMADol (ULTRAM) 50 MG tablet Take 1 tablet (50 mg total) by mouth every 6 (six) hours as needed for up to 3 days for severe pain. 02/20/20 02/23/20  Orvan July, NP    Family History Family History  Problem Relation Age of Onset  . Hypertension Brother   . Hypertension Brother   . Hypertension Sister     Social History Social History   Tobacco Use  . Smoking status: Current Every Day Smoker    Packs/day: 1.00    Years: 40.00    Pack years: 40.00    Types: Cigarettes  . Smokeless tobacco: Never Used  Substance Use Topics  . Alcohol use: Yes    Alcohol/week: 14.0 - 24.0 standard drinks    Types: 14 - 24 Cans of beer per week  . Drug use: No     Allergies   Patient has no known allergies.   Review of Systems Review of Systems   Physical Exam Triage Vital Signs ED Triage Vitals  Enc Vitals Group     BP 02/20/20 0919 122/81     Pulse Rate 02/20/20 0919 99     Resp 02/20/20 0919 18     Temp 02/20/20 0919 98.2 F (36.8 C)     Temp Source 02/20/20 0919 Oral     SpO2 02/20/20 0919 97 %     Weight --      Height --      Head Circumference --      Peak Flow --      Pain Score 02/20/20 0916 0     Pain Loc --      Pain Edu? --      Excl. in [place]? --    No data found.  Updated Vital Signs BP 122/81 (BP Location: Right Arm)   Pulse 99   Temp 98.2 F (36.8 C) (Oral)   Resp 18    LMP 12/03/2012   SpO2 97%   Visual Acuity Right Eye Distance:   Left Eye Distance:   Bilateral Distance:    Right Eye Near:   Left Eye Near:    Bilateral Near:     Physical Exam Vitals and nursing note reviewed.  Constitutional:      General: She is not in acute distress.    Appearance: Normal appearance. She is not ill-appearing, toxic-appearing or  diaphoretic.  HENT:     Head: Normocephalic.     Nose: Nose normal.     Mouth/Throat:     Pharynx: Oropharynx is clear.  Eyes:     Conjunctiva/sclera: Conjunctivae normal.  Pulmonary:     Effort: Pulmonary effort is normal.  Abdominal:     General: Abdomen is flat. Bowel sounds are normal. There is no distension or abdominal bruit.     Palpations: Abdomen is soft. There is no hepatomegaly or splenomegaly.     Tenderness: There is abdominal tenderness in the left lower quadrant.    Musculoskeletal:        General: Normal range of motion.     Cervical back: Normal range of motion.  Skin:    General: Skin is warm and dry.     Findings: No rash.  Neurological:     Mental Status: She is alert.  Psychiatric:        Mood and Affect: Mood normal.      UC Treatments / Results  Labs (all labs ordered are listed, but only abnormal results are displayed) Labs Reviewed  CBC WITH DIFFERENTIAL/PLATELET - Abnormal; Notable for the following components:      Result Value   nRBC 0.3 (*)    All other components within normal limits  COMPREHENSIVE METABOLIC PANEL - Abnormal; Notable for the following components:   Potassium 3.4 (*)    BUN <5 (*)    AST 58 (*)    All other components within normal limits  SARS CORONAVIRUS 2 (TAT 6-24 HRS)    EKG   Radiology DG Abd 2 Views  Result Date: 02/20/2020 CLINICAL DATA:  Abdomen pain since Sunday. EXAM: ABDOMEN - 2 VIEW COMPARISON:  None. FINDINGS: The bowel gas pattern is normal. There is no evidence of free air. No radio-opaque calculi or other significant radiographic abnormality  is seen. Mild linear atelectasis of medial left lung base is noted. IMPRESSION: No bowel obstruction. Mild linear atelectasis of medial left lung base. Electronically Signed   By: Abelardo Diesel M.D.   On: 02/20/2020 10:12    Procedures Procedures (including critical care time)  Medications Ordered in UC Medications - No data to display  Initial Impression / Assessment and Plan / UC Course  I have reviewed the triage vital signs and the nursing notes.  Pertinent labs & imaging results that were available during my care of the patient were reviewed by me and considered in my medical decision making (see chart for details).     LLQ and diarrhea-based on history, exam differentials include diverticulitis, gastroenteritis, infectious diarrhea, bowel obstruction Based on the specific left lower quadrant pain and symptoms and family history we will go ahead and treat for diverticulitis at this time to see if this improves her symptoms. Patient concern for Covid we will do a Covid swab. X-ray did not show any concerns for bowel obstruction Blood work mostly unremarkable. Prescribing tramadol for severe pain and Zofran for nausea, vomiting as needed. Augmentin for antibiotic coverage for diverticulitis Strict return precautions that if symptoms continue or worsen to include more severe abdominal pain and diarrhea she would need to go to the ER for further evaluation and CT scan. Patient understanding and agreed to plan. Otherwise vitals are stable at this time and she is nontoxic or ill-appearing. Final Clinical Impressions(s) / UC Diagnoses   Final diagnoses:  Left lower quadrant abdominal pain  Diarrhea, unspecified type     Discharge Instructions  Your x-ray did not show anything concerning.  We will go and treat you for possible diverticulitis. Take the antibiotic as prescribed. I'm giving you tramadol for pain to use as needed. You can continue the Imodium. You should start  taking a probiotic. Zofran as needed for nausea, vomiting      ED Prescriptions    Medication Sig Dispense Auth. Provider   amoxicillin-clavulanate (AUGMENTIN) 875-125 MG tablet Take 1 tablet by mouth every 12 (twelve) hours. 14 tablet Jager Koska A, NP   traMADol (ULTRAM) 50 MG tablet Take 1 tablet (50 mg total) by mouth every 6 (six) hours as needed for up to 3 days for severe pain. 12 tablet Bryler Dibble A, NP   ondansetron (ZOFRAN ODT) 4 MG disintegrating tablet Take 1 tablet (4 mg total) by mouth every 8 (eight) hours as needed for nausea or vomiting. 20 tablet Soliana Kitko A, NP     I have reviewed the PDMP during this encounter.   Loura Halt A, NP 02/21/20 [phone]

## 2020-07-22 ENCOUNTER — Ambulatory Visit (HOSPITAL_COMMUNITY)
Admission: EM | Admit: 2020-07-22 | Discharge: 2020-07-22 | Disposition: A | Discharge status: Home / Self Care | Payer: 59 | Attending: Family Medicine | Admitting: Family Medicine

## 2020-07-22 ENCOUNTER — Other Ambulatory Visit: Payer: Self-pay

## 2020-07-22 ENCOUNTER — Encounter (HOSPITAL_COMMUNITY): Payer: Self-pay

## 2020-07-22 DIAGNOSIS — M79604 Pain in right leg: Secondary | ICD-10-CM | POA: Diagnosis not present

## 2020-07-22 NOTE — Discharge Instructions (Signed)
Go to the [facility] and vascular Center at 8:00 in the morning for an ultrasound of your right leg.

## 2020-07-22 NOTE — ED Triage Notes (Signed)
Pt c/o pain behind right knee x 1 week. States she is concerned she has a DVT

## 2020-07-23 ENCOUNTER — Ambulatory Visit (HOSPITAL_COMMUNITY)
Admission: RE | Admit: 2020-07-23 | Discharge: 2020-07-23 | Disposition: A | Discharge status: Home / Self Care | Payer: 59 | Source: Ambulatory Visit | Attending: Family Medicine | Admitting: Family Medicine

## 2020-07-23 DIAGNOSIS — M79609 Pain in unspecified limb: Secondary | ICD-10-CM | POA: Diagnosis not present

## 2020-07-23 DIAGNOSIS — M25561 Pain in right knee: Secondary | ICD-10-CM | POA: Insufficient documentation

## 2020-07-23 NOTE — ED Provider Notes (Signed)
[place]    CSN: [id number] Arrival date & time: 07/22/20  1027      History   Chief Complaint Chief Complaint  Patient presents with  . Leg Pain    HPI Kim Braun is a 59 y.o. female.   Patient is a 59 year old female with past medical history of hypertension, rheumatoid arthritis.  She presents today with right posterior knee pain.  This has been constant for the past week.  Reporting radiation of pain into lower extremity.  No redness, swelling, increased warmth.  Concern for DVT.  Denies any injuries to the leg.  She is everyday smoker.  No recent traveling or hormone use.     Past Medical History:  Diagnosis Date  . Gestational diabetes   . Heart murmur    child  . Hypertension   . Left bundle branch block    11/2012 low risk, no ischemia, NL EF stress test (Dr. Marlou Porch)  . Rheumatoid arthritis(714.0)   . Smoker   . Thyroid nodule     Patient Active Problem List   Diagnosis Date Noted  . Memory loss 12/07/2017  . Shortness of breath 09/28/2014  . 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 Fibroids 12/19/2012  . ABDOMINAL PAIN, ACUTE 05/23/2007  . GOUT 02/26/2007  . HYPERTENSION 02/26/2007  . GERD 02/26/2007    Past Surgical History:  Procedure Laterality Date  . ABDOMINAL HYSTERECTOMY  12/19/2012   Procedure: HYSTERECTOMY ABDOMINAL;  Surgeon: Maeola Sarah. Landry Mellow, MD;  Location: [facility];  Service: Gynecology;  Laterality: N/A;  Incision @ 2536  . ANTERIOR CERVICAL DECOMP/DISCECTOMY FUSION N/A 07/17/2013   Procedure: Cervical four-five, Cervical five-six Anterior cervical decompression/diskectomy/fusion;  Surgeon: Floyce Stakes, MD;  Location: [facility];  Service: Neurosurgery;  Laterality: N/A;  Cervical four-five, Cervical five-six Anterior cervical decompression/diskectomy/fusion  . BILATERAL SALPINGECTOMY  12/19/2012   Procedure: BILATERAL SALPINGECTOMY;  Surgeon: Maeola Sarah. Landry Mellow, MD;  Location: [facility];  Service: Gynecology;   Laterality: Bilateral;  . CARDIAC CATHETERIZATION  09/30/2014   Procedure: LEFT HEART CATH AND CORONARY ANGIOGRAPHY;  Surgeon: Laverda Page, MD;  Location: [facility] CATH LAB;  Service: Cardiovascular;;  . CESAREAN SECTION    . COLON SURGERY  age 69  . CYSTOSCOPY  12/19/2012   Procedure: CYSTOSCOPY;  Surgeon: Maeola Sarah. Landry Mellow, MD;  Location: [facility];  Service: Gynecology;  Laterality: N/A;  . ROBOTIC ASSISTED TOTAL HYSTERECTOMY  12/19/2012   Procedure: ROBOTIC ASSISTED TOTAL HYSTERECTOMY;  Surgeon: Maeola Sarah. Landry Mellow, MD;  Location: [facility];  Service: Gynecology;  Laterality: N/A;  Attempted Robotic Total Hysterectomy    OB History   No obstetric history on file.      Home Medications    Prior to Admission medications   Medication Sig Start Date End Date Taking? Authorizing Provider  Adalimumab (HUMIRA PEN) 40 MG/0.4ML PNKT Inject 40mg  subcutaneously every *OTHER week as directed 02/05/20   [provider]  albuterol (PROVENTIL HFA;VENTOLIN HFA) 108 (90 BASE) MCG/ACT inhaler Inhale 2 puffs into the lungs daily as needed for wheezing or shortness of breath.    [provider]  Ergocalciferol (VITAMIN D2) 2000 UNITS TABS Take 2,000 Units by mouth daily.    [provider]  Ipratropium-Albuterol (COMBIVENT) 20-100 MCG/ACT AERS respimat Inhale 1 puff into the lungs every 6 (six) hours.    [provider]  losartan (COZAAR) 100 MG tablet Take 100 mg by mouth daily. 11/10/17   [provider]    Family History Family  History  Problem Relation Age of Onset  . Hypertension Brother   . Hypertension Brother   . Hypertension Sister     Social History Social History   Tobacco Use  . Smoking status: Current Every Day Smoker    Packs/day: 1.00    Years: 40.00    Pack years: 40.00    Types: Cigarettes  . Smokeless tobacco: Never Used  Substance Use Topics  . Alcohol use: Yes    Alcohol/week: 14.0 - 24.0 standard drinks    Types: 14 - 24 Cans of beer per  week  . Drug use: No     Allergies   Patient has no known allergies.   Review of Systems Review of Systems   Physical Exam Triage Vital Signs ED Triage Vitals  Enc Vitals Group     BP 07/22/20 1103 128/70     Pulse Rate 07/22/20 1103 (!) 106     Resp 07/22/20 1103 16     Temp 07/22/20 1103 98.5 F (36.9 C)     Temp src --      SpO2 07/22/20 1103 100 %     Weight --      Height --      Head Circumference --      Peak Flow --      Pain Score 07/22/20 1102 5     Pain Loc --      Pain Edu? --      Excl. in [place]? --    No data found.  Updated Vital Signs BP 128/70   Pulse (!) 106   Temp 98.5 F (36.9 C)   Resp 16   LMP 12/03/2012   SpO2 100%   Visual Acuity Right Eye Distance:   Left Eye Distance:   Bilateral Distance:    Right Eye Near:   Left Eye Near:    Bilateral Near:     Physical Exam Vitals and nursing note reviewed.  Constitutional:      General: She is not in acute distress.    Appearance: Normal appearance. She is not ill-appearing, toxic-appearing or diaphoretic.  HENT:     Head: Normocephalic.     Nose: Nose normal.  Eyes:     Conjunctiva/sclera: Conjunctivae normal.  Pulmonary:     Effort: Pulmonary effort is normal.  Musculoskeletal:        General: Normal range of motion.     Cervical back: Normal range of motion.     Comments: Tender to palpation of right posterior knee.  No swelling, erythema, increased blood.  No calf swelling or pain. 2+ pedal pulse   Skin:    General: Skin is warm and dry.     Findings: No rash.  Neurological:     Mental Status: She is alert.  Psychiatric:        Mood and Affect: Mood normal.      UC Treatments / Results  Labs (all labs ordered are listed, but only abnormal results are displayed) Labs Reviewed - No data to display  EKG   Radiology LE VENOUS  Result Date: 07/23/2020  Lower Venous DVT Study Indications: Pain.  Comparison Study: No prior study on file Performing Technologist:  Sharion Dove RVS  Examination Guidelines: A complete evaluation includes B-mode imaging, spectral Doppler, color Doppler, and power Doppler as needed of all accessible portions of each vessel. Bilateral testing is considered an integral part of a complete examination. Limited examinations for reoccurring indications may be performed as noted. The reflux  portion of the exam is performed with the patient in reverse Trendelenburg.  +---------+---------------+---------+-----------+----------+--------------+ RIGHT    CompressibilityPhasicitySpontaneityPropertiesThrombus Aging +---------+---------------+---------+-----------+----------+--------------+ CFV      Full           Yes      Yes                                 +---------+---------------+---------+-----------+----------+--------------+ SFJ      Full                                                        +---------+---------------+---------+-----------+----------+--------------+ FV Prox  Full                                                        +---------+---------------+---------+-----------+----------+--------------+ FV Mid   Full                                                        +---------+---------------+---------+-----------+----------+--------------+ FV DistalFull                                                        +---------+---------------+---------+-----------+----------+--------------+ PFV      Full                                                        +---------+---------------+---------+-----------+----------+--------------+ POP      Full           Yes      Yes                                 +---------+---------------+---------+-----------+----------+--------------+ PTV      Full                                                        +---------+---------------+---------+-----------+----------+--------------+ PERO     Full                                                         +---------+---------------+---------+-----------+----------+--------------+   +----+---------------+---------+-----------+----------+--------------+ LEFTCompressibilityPhasicitySpontaneityPropertiesThrombus Aging +----+---------------+---------+-----------+----------+--------------+ CFV Full           Yes      Yes                                 +----+---------------+---------+-----------+----------+--------------+  Summary: RIGHT: - There is no evidence of deep vein thrombosis in the lower extremity.  - A cystic structure is found in the popliteal fossa.   *See table(s) above for measurements and observations.    Preliminary     Procedures Procedures (including critical care time)  Medications Ordered in UC Medications - No data to display  Initial Impression / Assessment and Plan / UC Course  I have reviewed the triage vital signs and the nursing notes.  Pertinent labs & imaging results that were available during my care of the patient were reviewed by me and considered in my medical decision making (see chart for details).     Right leg pain Most likely some sort of muscle strain.  Patient concerned for DVT.  Will send for ultrasound to rule out DVT, Baker's cyst. No obvious thrombophlebitis. Patient has appointment to get ultrasound 8:00 in the morning at the heart vascular center.  Heart vascular call with results this morning and patient negative for DVT or Baker's cyst. We will go with plan for treatment of pulled muscle/muscle strain.  Final Clinical Impressions(s) / UC Diagnoses   Final diagnoses:  Right leg pain     Discharge Instructions     Go to the heart and vascular Center at 8:00 in the morning for an ultrasound of your right leg.      ED Prescriptions    None     PDMP not reviewed this encounter.   Orvan July, NP 07/23/20 1241

## 2020-07-23 NOTE — Progress Notes (Signed)
VASCULAR LAB    Right lower extremity venous duplex completed.    Preliminary report:  See CV proc for preliminary results.  Called report to Loura Halt, NP  Mauro Kaufmann, Paulene Tayag, RVT 07/23/2020, 8:57 AM

## 2020-09-28 ENCOUNTER — Telehealth: Payer: Self-pay | Admitting: Family Medicine

## 2020-09-28 NOTE — Telephone Encounter (Signed)
Dr. Darnell Level has agreed to take her on as a TOC.  Called and left a vm to call us to schedule new patient appt. EM 09/28/20

## 2020-11-18 ENCOUNTER — Ambulatory Visit (INDEPENDENT_AMBULATORY_CARE_PROVIDER_SITE_OTHER): Payer: Medicare Other | Admitting: Family Medicine

## 2020-11-18 ENCOUNTER — Encounter: Payer: Self-pay | Admitting: Family Medicine

## 2020-11-18 ENCOUNTER — Other Ambulatory Visit: Payer: Self-pay

## 2020-11-18 VITALS — BP 120/74 | HR 95 | Temp 98.3°F | Ht 62.5 in | Wt 105.5 lb

## 2020-11-18 DIAGNOSIS — E041 Nontoxic single thyroid nodule: Secondary | ICD-10-CM | POA: Insufficient documentation

## 2020-11-18 DIAGNOSIS — I10 Essential (primary) hypertension: Secondary | ICD-10-CM | POA: Insufficient documentation

## 2020-11-18 DIAGNOSIS — R918 Other nonspecific abnormal finding of lung field: Secondary | ICD-10-CM | POA: Insufficient documentation

## 2020-11-18 DIAGNOSIS — J432 Centrilobular emphysema: Secondary | ICD-10-CM

## 2020-11-18 DIAGNOSIS — I447 Left bundle-branch block, unspecified: Secondary | ICD-10-CM | POA: Diagnosis not present

## 2020-11-18 DIAGNOSIS — M069 Rheumatoid arthritis, unspecified: Secondary | ICD-10-CM | POA: Diagnosis not present

## 2020-11-18 DIAGNOSIS — F17200 Nicotine dependence, unspecified, uncomplicated: Secondary | ICD-10-CM

## 2020-11-18 NOTE — Assessment & Plan Note (Signed)
Chronic, stable on losartan 100mg  daily - continue. Does not yet need refills.

## 2020-11-18 NOTE — Assessment & Plan Note (Signed)
On Humira followed by Rheum (Dr Kathlene November at [facility]) Latest labs/records will be requested.

## 2020-11-18 NOTE — Patient Instructions (Addendum)
We will refer you to pulmonology in [place].  Use spiriva every night.  Return at your convenience for physical, prior for fasting labs.  Good to meet you today Keep considering quitting smoking - best thing to do for your health.  We will refer you for thyroid ultrasound to monitor possible nodule.

## 2020-11-18 NOTE — Assessment & Plan Note (Signed)
CT showing irregular narrowing of the left lower lobe bronchus associated with near complete atelectasis of the left lower lobe. S/p bronchoscopy 01/2019 with benign biopsy through [facility] pulmonology. Will refer back to pulm to establish care.

## 2020-11-18 NOTE — Assessment & Plan Note (Signed)
Manages with PRN spiriva and combivent. Advised start spiriva daily as controller medication. Encouraged full smoking cessation.

## 2020-11-18 NOTE — Assessment & Plan Note (Addendum)
Continued smoker. Encouraged full cessation in setting of known COPD. She would qualify for lung cancer screening program - requests pulm referral instead- see above

## 2020-11-18 NOTE — Assessment & Plan Note (Addendum)
Noted today to left, in h/o same - will update thyroid US.

## 2020-11-18 NOTE — Progress Notes (Signed)
Patient ID: Kim Braun, female    DOB: December 09, 1960, 59 y.o.   MRN: [id number]  This visit was conducted in person.  BP 120/74 (BP Location: Left Arm, Patient Position: Sitting, Cuff Size: Normal)   Pulse 95   Temp 98.3 F (36.8 C) (Temporal)   Ht 5' 2.5" (1.588 m)   Wt 105 lb 8 oz (47.9 kg)   LMP 12/03/2012   SpO2 97%   BMI 18.99 kg/m    CC: new pt to establish care Subjective:   HPI: Kim Braun is a 59 y.o. female presenting on 11/18/2020 for New Patient (Initial Visit)   I see patient's husband Annie Main  Previously saw Alvester Chou NP ([facility]) - practice closed once COVID hit. Unsure when last physical.   Sees rheumatologist Dr Kathlene November for known rheumatoid arthritis on Humira.  OP - on fosamax 70mg  weekly. Last DEXA 07/2020 through rheum. Also takes calcium/vit D + vit D. Lactose intolerant - avoids dairy.   HTN - Compliant with current antihypertensive regimen of losartan 100mg  daily. Does not check blood pressures at home. No low blood pressure readings or symptoms of dizziness/syncope. Denies HA, vision changes, CP/tightness, SOB, leg swelling.   COPD (dx early 2020) current smoker <1/2 ppd. Started smoking 1979, up to 1 ppd (>30 PY hx). On spiriva PRN, also takes combivent and albuterol inh PRN. Notes AM congestion, smoker's cough. No fevers or significant exertional dyspnea. Abnormal CXR - s/p CT through [facility] - records reviewed in [place] - s/p benign biopsy and negative testing for fungal infection and TB. Last seen 01/2019, plan was 6 mo f/u, did not return. Requests local pulm referral [facility]). Labs at that time negative for aspergillus, negative fungal antibody panel, negative QuantiFERON, negative sputum culture and a negative allergy panel Nicorette gum caused nausea.   Unintentional weight loss - endorses poor appetite.   Eligible for lung cancer screening - but declines at this time. Requests pulm referral.   Smoker - 1/2  ppd Alcohol - beer/wine 1-2/day No rec drugs.   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 Interim medical history since our last visit reviewed. Allergies and medications reviewed and updated. Outpatient Medications Prior to Visit  Medication Sig Dispense Refill  . Adalimumab (HUMIRA PEN) 40 MG/0.4ML PNKT Inject 40mg  subcutaneously every *OTHER week as directed    . albuterol (PROVENTIL HFA;VENTOLIN HFA) 108 (90 BASE) MCG/ACT inhaler Inhale 2 puffs into the lungs daily as needed for wheezing or shortness of breath.    Marland Kitchen alendronate (FOSAMAX) 70 MG tablet Take 70 mg by mouth once a week.    . betamethasone dipropionate 0.05 % cream Apply topically 2 (two) times daily.    . Biotin 10 MG TABS 1 tablet    . CALCIUM-VITAMIN D PO Take by mouth daily. Calcium 600 mg + vit D3    . Cholecalciferol (VITAMIN D3 PO) Take 2,000 Units by mouth.    . Ipratropium-Albuterol (COMBIVENT) 20-100 MCG/ACT AERS respimat Inhale 1 puff into the lungs every 6 (six) hours.    Marland Kitchen KLOR-CON M20 20 MEQ tablet Take 20 mEq by mouth daily.    Marland Kitchen L-Lysine 500 MG TABS Take by mouth daily.    Marland Kitchen losartan (COZAAR) 100 MG tablet Take 100 mg by mouth daily.  5  . SPIRIVA HANDIHALER 18 MCG inhalation capsule 1 capsule daily.    Marland Kitchen  Ergocalciferol (VITAMIN D2) 2000 UNITS TABS Take 2,000 Units by mouth daily.     No facility-administered medications prior to visit.     Per HPI unless specifically indicated in ROS section below Review of Systems Objective:  BP 120/74 (BP Location: Left Arm, Patient Position: Sitting, Cuff Size: Normal)   Pulse 95   Temp 98.3 F (36.8 C) (Temporal)   Ht 5' 2.5" (1.588 m)   Wt 105 lb 8 oz (47.9 kg)   LMP 12/03/2012   SpO2 97%   BMI 18.99 kg/m   Wt Readings from Last 3 Encounters:  11/18/20 105 lb 8 oz (47.9 kg)  01/11/18 113 lb (51.3 kg)  12/07/17 114 lb (51.7 kg)       Physical Exam Vitals and nursing note reviewed.  Constitutional:      Appearance: Normal appearance. She is underweight. She is not ill-appearing.  HENT:     Right Ear: Tympanic membrane, ear canal and external ear normal. There is no impacted cerumen.     Left Ear: Tympanic membrane, ear canal and external ear normal. There is no impacted cerumen.  Eyes:     Extraocular Movements: Extraocular movements intact.     Pupils: Pupils are equal, round, and reactive to light.  Cardiovascular:     Rate and Rhythm: Normal rate and regular rhythm.     Pulses: Normal pulses.     Heart sounds: Normal heart sounds. No murmur heard.   Pulmonary:     Effort: Pulmonary effort is normal. No respiratory distress.     Breath sounds: Normal breath sounds. No wheezing, rhonchi or rales.  Musculoskeletal:     Right lower leg: No edema.     Left lower leg: No edema.  Skin:    General: Skin is warm and dry.     Findings: No rash.  Neurological:     Mental Status: She is alert.  Psychiatric:        Mood and Affect: Mood normal.       Results for orders placed or performed during the hospital encounter of 02/20/20  SARS CORONAVIRUS 2 (TAT 6-24 HRS) Nasopharyngeal Nasopharyngeal Swab   Specimen: Nasopharyngeal Swab  Result Value Ref Range   SARS Coronavirus 2 NEGATIVE NEGATIVE  CBC with Differential  Result Value Ref Range   WBC 6.6 4.0 - 10.5 K/uL   RBC 4.20 3.87 - 5.11 MIL/uL   Hemoglobin 13.4 12.0 - 15.0 g/dL   HCT 39.6 36.0 - 46.0 %   MCV 94.3 80.0 - 100.0 fL   MCH 31.9 26.0 - 34.0 pg   MCHC 33.8 30.0 - 36.0 g/dL   RDW 13.1 11.5 - 15.5 %   Platelets 268 150 - 400 K/uL   nRBC 0.3 (H) 0.0 - 0.2 %   Neutrophils Relative % 60 %   Neutro Abs 4.0 1.7 - 7.7 K/uL   Lymphocytes Relative 27 %   Lymphs Abs 1.8 0.7 - 4.0 K/uL   Monocytes Relative 11 %   Monocytes Absolute 0.7 0.1 - 1.0 K/uL   Eosinophils Relative 1 %   Eosinophils Absolute 0.1 0.0 - 0.5 K/uL   Basophils Relative 1 %    Basophils Absolute 0.0 0.0 - 0.1 K/uL   Immature Granulocytes 0 %   Abs Immature Granulocytes 0.01 0.00 - 0.07 K/uL  Comprehensive metabolic panel  Result Value Ref Range   Sodium 140 135 - 145 mmol/L   Potassium 3.4 (L) 3.5 - 5.1 mmol/L   Chloride 103 98 -  111 mmol/L   CO2 25 22 - 32 mmol/L   Glucose, Bld 80 70 - 99 mg/dL   BUN <5 (L) 8 - 23 mg/dL   Creatinine, Ser 0.70 0.44 - 1.00 mg/dL   Calcium 9.1 8.9 - 10.3 mg/dL   Total Protein 7.0 6.5 - 8.1 g/dL   Albumin 3.6 3.5 - 5.0 g/dL   AST 58 (H) 15 - 41 U/L   ALT 37 0 - 44 U/L   Alkaline Phosphatase 68 38 - 126 U/L   Total Bilirubin 0.7 0.3 - 1.2 mg/dL   GFR calc non Af Amer >60 >60 mL/min   GFR calc Af Amer >60 >60 mL/min   Anion gap 12 5 - 15   Assessment & Plan:  This visit occurred during the SARS-CoV-2 public health emergency.  Safety protocols were in place, including screening questions prior to the visit, additional usage of staff PPE, and extensive cleaning of exam room while observing appropriate contact time as indicated for disinfecting solutions.   Problem List Items Addressed This Visit    Thyroid nodule    Noted today to left, in h/o same - will update thyroid US.       Relevant Orders   US THYROID   Smoker    Continued smoker. Encouraged full cessation in setting of known COPD. She would qualify for lung cancer screening program - requests pulm referral instead- see above       Rheumatoid arthritis ([place])    On Humira followed by Rheum (Dr Kathlene November at [facility]) Latest labs/records will be requested.       Left bundle branch block   Hypertension    Chronic, stable on losartan 100mg  daily - continue. Does not yet need refills.       Centrilobular emphysema ([place]) - Primary    Manages with PRN spiriva and combivent. Advised start spiriva daily as controller medication. Encouraged full smoking cessation.       Relevant Medications   SPIRIVA HANDIHALER 18 MCG inhalation capsule   Other Relevant Orders    Ambulatory referral to Pulmonology   Abnormal CT scan of lung    CT showing irregular narrowing of the left lower lobe bronchus associated with near complete atelectasis of the left lower lobe. S/p bronchoscopy 01/2019 with benign biopsy through [facility] pul[facility]ology. Will refer back to pulm to establish care.           No orders of the defined types were placed in this encounter.  Orders Placed This Encounter  Procedures  . US THYROID    Standing Status:   Future    Standing Expiration Date:   11/18/2021    Order Specific Question:   Reason for Exam (SYMPTOM  OR DIAGNOSIS REQUIRED)    Answer:   f/u thyroid ultrasound    Order Specific Question:   Preferred imaging location?    Answer:   [facility]  . Ambulatory referral to Pulmonology    Referral Priority:   Routine    Referral Type:   Consultation    Referral Reason:   Specialty Services Required    Requested Specialty:   Pulmonary Disease    Number of Visits Requested:   1    Patient Instructions  We will refer you to pulmonology in [place].  Use spiriva every night.  Return at your convenience for physical, prior for fasting labs.  Good to meet you today Keep considering quitting smoking - best thing to do for your health.  We will  refer you for thyroid ultrasound to monitor possible nodule.    Follow up plan: Return in about 4 months (around 03/19/2021) for annual exam, prior fasting for blood work.  Ria Bush, MD

## 2020-12-01 ENCOUNTER — Other Ambulatory Visit: Payer: Self-pay

## 2020-12-01 ENCOUNTER — Ambulatory Visit
Admission: RE | Admit: 2020-12-01 | Discharge: 2020-12-01 | Disposition: A | Discharge status: Home / Self Care | Payer: Medicare Other | Source: Ambulatory Visit | Attending: Family Medicine | Admitting: Family Medicine

## 2020-12-01 DIAGNOSIS — E041 Nontoxic single thyroid nodule: Secondary | ICD-10-CM | POA: Diagnosis not present

## 2020-12-01 DIAGNOSIS — E042 Nontoxic multinodular goiter: Secondary | ICD-10-CM | POA: Diagnosis not present

## 2020-12-03 ENCOUNTER — Other Ambulatory Visit: Payer: Self-pay | Admitting: Family Medicine

## 2020-12-03 DIAGNOSIS — E041 Nontoxic single thyroid nodule: Secondary | ICD-10-CM

## 2020-12-16 ENCOUNTER — Ambulatory Visit
Admission: RE | Admit: 2020-12-16 | Discharge: 2020-12-16 | Disposition: A | Discharge status: Home / Self Care | Payer: Medicare Other | Source: Ambulatory Visit | Attending: Family Medicine | Admitting: Family Medicine

## 2020-12-16 ENCOUNTER — Other Ambulatory Visit (HOSPITAL_COMMUNITY)
Admission: RE | Admit: 2020-12-16 | Discharge: 2020-12-16 | Disposition: A | Discharge status: Home / Self Care | Payer: Medicare Other | Source: Ambulatory Visit | Attending: Family Medicine | Admitting: Family Medicine

## 2020-12-16 DIAGNOSIS — E041 Nontoxic single thyroid nodule: Secondary | ICD-10-CM | POA: Diagnosis not present

## 2020-12-17 ENCOUNTER — Other Ambulatory Visit: Payer: Self-pay

## 2020-12-17 ENCOUNTER — Ambulatory Visit: Payer: Medicare Other | Admitting: Internal Medicine

## 2020-12-17 ENCOUNTER — Encounter: Payer: Self-pay | Admitting: Internal Medicine

## 2020-12-17 VITALS — BP 105/80 | HR 104 | Temp 97.0°F | Ht 63.0 in | Wt 106.4 lb

## 2020-12-17 DIAGNOSIS — J449 Chronic obstructive pulmonary disease, unspecified: Secondary | ICD-10-CM

## 2020-12-17 DIAGNOSIS — F17210 Nicotine dependence, cigarettes, uncomplicated: Secondary | ICD-10-CM | POA: Diagnosis not present

## 2020-12-17 LAB — CYTOLOGY - NON PAP

## 2020-12-17 NOTE — Progress Notes (Signed)
Name: Kim Braun MRN: [id number] DOB: 02/03/1961     CONSULTATION DATE: 12/17/2020  REFERRING MD : Dr. Aileen Pilot  CHIEF COMPLAINT: Assessment for COPD   HISTORY OF PRESENT ILLNESS: 60 year old pleasant white female seen today to establish care for assessment for COPD Patient is an active smoker of 1/2 pack/day for the last 2 to 50 years  She has been diagnosed with COPD 2 years ago Patient supposedly had a diagnosis of pneumonia at the same time  Patient is under the impression she had a abnormal CT scan in the past with left lower lobe lung biopsies however I do not see any evidence of this in the history  Patient has chronic shortness of breath chronic dyspnea on exertion Patient has a productive cough for many years which is consistent with chronic bronchitis  No exacerbation at this time No evidence of heart failure at this time No evidence or signs of infection at this time No respiratory distress No fevers, chills, nausea, vomiting, diarrhea No evidence of lower extremity edema No evidence hemoptysis  Patient is supposedly taking Spiriva Respimat however she is takes this intermittently and is not consistent  I have explained to her patient needs pulmonary function testing along with assessing for nocturnal and exertional hypoxia and is a candidate for lung cancer screening program  Smoking Assessment and Cessation Counseling Upon further questioning, Patient smokes 1/2  I have advised patient to quit/stop smoking as soon as possible due to high risk for multiple medical problems  Patient  is NOT willing to quit smoking  I have advised patient that we can assist and have options of Nicotine replacement therapy. I also advised patient on behavioral therapy and can provide oral medication therapy in conjunction with the other therapies Follow up next Office visit  for assessment of smoking cessation Smoking cessation counseling advised for 4 minutes     PAST  MEDICAL HISTORY :   has a past medical history of COPD (chronic obstructive pulmonary disease) ([place]) (01/2019), History of cardiac murmur as a child, History of gestational diabetes, Hypertension, Left bundle branch block, Rheumatoid arthritis(714.0), Smoker, and Thyroid nodule.  has a past surgical history that includes Cesarean section; Colon surgery (1992); Robotic assisted total hysterectomy (12/19/2012); Bilateral salpingectomy (12/19/2012); Abdominal hysterectomy (12/19/2012); Cystoscopy (12/19/2012); Anterior cervical decomp/discectomy fusion (N/A, 07/17/2013); and Cardiac catheterization (09/30/2014). Prior to Admission medications   Medication Sig Start Date End Date Taking? Authorizing Provider  Adalimumab (HUMIRA PEN) 40 MG/0.4ML PNKT Inject 40mg  subcutaneously every *OTHER week as directed 02/05/20  Yes [provider]  albuterol (PROVENTIL HFA;VENTOLIN HFA) 108 (90 BASE) MCG/ACT inhaler Inhale 2 puffs into the lungs daily as needed for wheezing or shortness of breath.   Yes [provider]  alendronate (FOSAMAX) 70 MG tablet Take 70 mg by mouth once a week. 11/12/20  Yes [provider]  betamethasone dipropionate 0.05 % cream Apply topically 2 (two) times daily.   Yes [provider]  Biotin 10 MG TABS 1 tablet   Yes [provider]  CALCIUM-VITAMIN D PO Take by mouth daily. Calcium 600 mg + vit D3   Yes [provider]  Cholecalciferol (VITAMIN D3 PO) Take 2,000 Units by mouth.   Yes [provider]  KLOR-CON M20 20 MEQ tablet Take 20 mEq by mouth daily. 09/14/20  Yes [provider]  L-Lysine 500 MG TABS Take by mouth daily.   Yes [provider]  losartan (COZAAR) 100 MG tablet Take 100 mg by  mouth daily. 11/10/17  Yes [provider]  SPIRIVA HANDIHALER 18 MCG inhalation capsule 1 capsule daily. 08/24/20  Yes [provider]   No Known Allergies  FAMILY HISTORY:  family history includes  CAD in her brother and mother; Diabetes in her sister; Hypertension in her brother, brother, and sister; Stroke in her mother. SOCIAL HISTORY:  reports that she has been smoking cigarettes. She has a 40.00 pack-year smoking history. She has never used smokeless tobacco. She reports current alcohol use of about 14.0 - 24.0 standard drinks of alcohol per week. She reports that she does not use drugs.    Review of Systems:  Gen:  Denies  fever, sweats, chills weigh loss  HEENT: Denies blurred vision, double vision, ear pain, eye pain, hearing loss, nose bleeds, sore throat Cardiac:  No dizziness, chest pain or heaviness, chest tightness,edema, No JVD Resp:   No cough, -sputum production, -shortness of breath,-wheezing, -hemoptysis,  Gi: Denies swallowing difficulty, stomach pain, nausea or vomiting, diarrhea, constipation, bowel incontinence Gu:  Denies bladder incontinence, burning urine Ext:   Denies Joint pain, stiffness or swelling Skin: Denies  skin rash, easy bruising or bleeding or hives Endoc:  Denies polyuria, polydipsia , polyphagia or weight change Psych:   Denies depression, insomnia or hallucinations  Other:  All other systems negative    BP 105/80 (BP Location: Left Arm, Patient Position: Sitting, Cuff Size: Normal)   Pulse (!) 104   Temp (!) 97 F (36.1 C) (Temporal)   Ht 5\' 3"  (1.6 m)   Wt 106 lb 6.4 oz (48.3 kg)   LMP 12/03/2012   SpO2 98%   BMI 18.85 kg/m     SpO2: 98 % O2 Device: None (Room air)    Physical Examination:   GENERAL:NAD, no fevers, chills, no weakness no fatigue HEAD: Normocephalic, atraumatic.  EYES: PERLA, EOMI No scleral icterus.  MOUTH: Moist mucosal membrane.  EAR, NOSE, THROAT: Clear without exudates. No external lesions.  NECK: Supple.  PULMONARY: CTA B/L no wheezing, rhonchi, crackles CARDIOVASCULAR: S1 and S2. Regular rate and rhythm. No murmurs GASTROINTESTINAL: Soft, nontender, nondistended. Positive bowel sounds.   MUSCULOSKELETAL: No swelling, clubbing, or edema.  NEUROLOGIC: No gross focal neurological deficits. 5/5 strength all extremities SKIN: No ulceration, lesions, rashes, or cyanosis.  PSYCHIATRIC: Insight, judgment intact. -depression -anxiety ALL OTHER ROS ARE NEGATIVE   MEDICATIONS: I have reviewed all medications and confirmed regimen as documented    CULTURE RESULTS   No results found for this or any previous visit (from the past 240 hour(s)).        IMAGING    US Thyroid Biopsy  Result Date: 12/16/2020 INDICATION: Indeterminate thyroid nodule Isthmus nodule 1.6 cm EXAM: ULTRASOUND GUIDED FINE NEEDLE ASPIRATION OF INDETERMINATE THYROID NODULE COMPARISON:  [place] 12/01/20 MEDICATIONS: 5 cc 1% lidocaine COMPLICATIONS: None immediate. TECHNIQUE: Informed written consent was obtained from the patient after a discussion of the risks, benefits and alternatives to treatment. Questions regarding the procedure were encouraged and answered. A timeout was performed prior to the initiation of the procedure. Pre-procedural ultrasound scanning demonstrated unchanged size and appearance of the indeterminate nodule within the isthmus The procedure was planned. The neck was prepped in the usual sterile fashion, and a sterile drape was applied covering the operative field. A timeout was performed prior to the initiation of the procedure. Local anesthesia was provided with 1% lidocaine. Under direct ultrasound guidance, 5 FNA biopsies were performed of the isthmus nodule with a 25 gauge needle. 2 of  these samples were obtained for [facility] Multiple ultrasound images were saved for procedural documentation purposes. The samples were prepared and submitted to pathology. Limited post procedural scanning was negative for hematoma or additional complication. Dressings were placed. The patient tolerated the above procedures procedure well without immediate postprocedural complication. FINDINGS: Nodule reference number  based on prior diagnostic ultrasound: 1 Maximum size: 1.6 cm Location: Isthmus; Mid ACR TI-RADS risk category: TR4 (4-6 points) Reason for biopsy: meets ACR TI-RADS criteria Ultrasound imaging confirms appropriate placement of the needles within the thyroid nodule. IMPRESSION: Technically successful ultrasound guided fine needle aspiration of Isthmus nodule Read by Lavonia Drafts [facility] Electronically Signed   By: Jerilynn Mages.  Shick M.D.   On: 12/16/2020 16:13       ASSESSMENT AND PLAN SYNOPSIS 60 yo white female seen today for ongoing tobacco abuse with a history and diagnosis of COPD  At this time I would need to assess her COPD with obtaining pulmonary function testing along with assessing for exertional hypoxia and nocturnal hypoxia with 6-minute walk test and overnight pulse oximetry  Obtain 6-minute walk test Obtain pulmonary function testing Obtain overnight pulse oximetry Recommend smoking cessation Continue Spiriva as prescribed   COVID-19 EDUCATION: The signs and symptoms of COVID-19 were discussed with the patient and how to seek care for testing.  The importance of social distancing was discussed today. Hand Washing Techniques and avoid touching face was advised.     MEDICATION ADJUSTMENTS/LABS AND TESTS ORDERED: Please stop smoking Continue Spiriva as prescribed Obtain 6-minute walk test for exertional hypoxia Obtain overnight pulse oximetry for nocturnal hypoxia Obtain pulmonary function testing to assess lung function   CURRENT MEDICATIONS REVIEWED AT LENGTH WITH PATIENT TODAY   Patient satisfied with Plan of action and management. All questions answered  Follow up in 6 months  Total time spent 46 minutes   Corrin Parker, M.D.  [facility]  Medical Director [place] Director [facility] Cardio-Pulmonary Department

## 2020-12-17 NOTE — Patient Instructions (Addendum)
Please stop smoking Continue Spiriva as prescribed Obtain 6-minute walk test for exertional hypoxia Obtain overnight pulse oximetry for nocturnal hypoxia Obtain pulmonary function testing to assess lung function Lung cancer screening referral program

## 2020-12-23 ENCOUNTER — Telehealth: Payer: Self-pay | Admitting: *Deleted

## 2020-12-23 DIAGNOSIS — Z87891 Personal history of nicotine dependence: Secondary | ICD-10-CM

## 2020-12-23 DIAGNOSIS — Z122 Encounter for screening for malignant neoplasm of respiratory organs: Secondary | ICD-10-CM

## 2020-12-23 NOTE — Telephone Encounter (Signed)
Received referral for initial lung cancer screening scan. Contacted patient and obtained smoking history,(current, 44 pack year) as well as answering questions related to screening process. Patient denies signs of lung cancer such as weight loss or hemoptysis. Patient denies comorbidity that would prevent curative treatment if lung cancer were found. Patient is scheduled for shared decision making visit and CT scan on 01/06/21 at 1045am.

## 2020-12-25 ENCOUNTER — Telehealth: Payer: Self-pay | Admitting: Internal Medicine

## 2020-12-25 NOTE — Telephone Encounter (Signed)
Called and spoke to patient, who is requesting refill on Spiriva 2.5. It does not appear that Dr. Mortimer Fries has prescribed respimat previously, our records show handihaler only.  Patient prefers respimat.  Dr. Mortimer Fries, please advise if okay to refill? Thanks

## 2020-12-28 MED ORDER — SPIRIVA RESPIMAT 2.5 MCG/ACT IN AERS: 2.0000 | INHALATION_SPRAY | Freq: Every day | RESPIRATORY_TRACT | 2 refills | Status: DC

## 2020-12-28 NOTE — Telephone Encounter (Signed)
Rx for Spiriva 2.5 has been sent to preferred pharmacy.  Patient is aware and voiced her understanding.  Nothing further needed.

## 2020-12-28 NOTE — Telephone Encounter (Signed)
Please refill for Spiriva 2.5 respimat

## 2020-12-31 DIAGNOSIS — G4730 Sleep apnea, unspecified: Secondary | ICD-10-CM | POA: Diagnosis not present

## 2020-12-31 DIAGNOSIS — R0683 Snoring: Secondary | ICD-10-CM | POA: Diagnosis not present

## 2021-01-06 ENCOUNTER — Ambulatory Visit
Admission: RE | Admit: 2021-01-06 | Discharge: 2021-01-06 | Disposition: A | Discharge status: Home / Self Care | Payer: Medicare Other | Source: Ambulatory Visit | Attending: Oncology | Admitting: Oncology

## 2021-01-06 ENCOUNTER — Inpatient Hospital Stay: Payer: Medicare Other | Attending: Oncology | Admitting: Oncology

## 2021-01-06 ENCOUNTER — Encounter: Payer: Self-pay | Admitting: Oncology

## 2021-01-06 ENCOUNTER — Other Ambulatory Visit: Payer: Self-pay

## 2021-01-06 DIAGNOSIS — Z87891 Personal history of nicotine dependence: Secondary | ICD-10-CM | POA: Insufficient documentation

## 2021-01-06 DIAGNOSIS — Z122 Encounter for screening for malignant neoplasm of respiratory organs: Secondary | ICD-10-CM | POA: Diagnosis not present

## 2021-01-06 DIAGNOSIS — F17210 Nicotine dependence, cigarettes, uncomplicated: Secondary | ICD-10-CM | POA: Diagnosis not present

## 2021-01-06 NOTE — Progress Notes (Signed)
Virtual Visit via Video Note  I connected with Kim Braun on 01/06/21 at 10:45 AM EST by a video enabled telemedicine application and verified that I am speaking with the correct person using two identifiers.  Location: Patient: [facility] Provider: Clinic    I discussed the limitations of evaluation and management by telemedicine and the availability of in person appointments. The patient expressed understanding and agreed to proceed.  I discussed the assessment and treatment plan with the patient. The patient was provided an opportunity to ask questions and all were answered. The patient agreed with the plan and demonstrated an understanding of the instructions.   The patient was advised to call back or seek an in-person evaluation if the symptoms worsen or if the condition fails to improve as anticipated.   In accordance with CMS guidelines, patient has met eligibility criteria including age, absence of signs or symptoms of lung cancer.  Social History   Tobacco Use  . Smoking status: Current Every Day Smoker    Packs/day: 1.00    Years: 44.00    Pack years: 44.00    Types: Cigarettes  . Smokeless tobacco: Never Used  . Tobacco comment: smoking 1/2 ppd.  very stressed.  Substance Use Topics  . Alcohol use: Yes    Alcohol/week: 14.0 - 24.0 standard drinks    Types: 14 - 24 Cans of beer per week  . Drug use: No      A shared decision-making session was conducted prior to the performance of CT scan. This includes one or more decision aids, includes benefits and harms of screening, follow-up diagnostic testing, over-diagnosis, false positive rate, and total radiation exposure.   Counseling on the importance of adherence to annual lung cancer LDCT screening, impact of co-morbidities, and ability or willingness to undergo diagnosis and treatment is imperative for compliance of the program.   Counseling on the importance of continued smoking cessation for former smokers; the importance  of smoking cessation for current smokers, and information about tobacco cessation interventions have been given to patient including [place] and 1800 quit [place] programs.   Written order for lung cancer screening with LDCT has been given to the patient and any and all questions have been answered to the best of my abilities.    Yearly follow up will be coordinated by Burgess Estelle, Thoracic Navigator.  I provided 15 minutes of face-to-face video visit time during this encounter, and > 50% was spent counseling as documented under my assessment & plan.   Jacquelin Hawking, NP

## 2021-01-08 ENCOUNTER — Encounter: Payer: Self-pay | Admitting: *Deleted

## 2021-01-15 ENCOUNTER — Telehealth: Payer: Self-pay | Admitting: Internal Medicine

## 2021-01-15 DIAGNOSIS — J449 Chronic obstructive pulmonary disease, unspecified: Secondary | ICD-10-CM

## 2021-01-15 NOTE — Telephone Encounter (Signed)
ONO reviewed by Dr. Jackie Plum 2L QHS. Low spo2 81%. Patient is aware of results and voiced his understanding. Order has been placed to [employer], as patient is agreeable.  Nothing further needed.

## 2021-01-22 ENCOUNTER — Telehealth: Payer: Self-pay | Admitting: Internal Medicine

## 2021-01-22 NOTE — Telephone Encounter (Signed)
I sent an urgent message to [employer] but even though the order was placed on 2/18 the order was not signed until 2/22

## 2021-01-22 NOTE — Telephone Encounter (Signed)
I got a response from Roxie with [employer] that he 02 is out for delivery

## 2021-01-22 NOTE — Telephone Encounter (Signed)
Spoke to patient, who is calling for update on nighttime oxygen. She has not been contacted by [employer].  Order was placed on 01/15/2021.  Rodena Piety, can you help with this? Thank you

## 2021-01-22 NOTE — Telephone Encounter (Signed)
Lm for patient.  

## 2021-01-26 DIAGNOSIS — J449 Chronic obstructive pulmonary disease, unspecified: Secondary | ICD-10-CM | POA: Diagnosis not present

## 2021-01-29 ENCOUNTER — Telehealth: Payer: Self-pay | Admitting: Internal Medicine

## 2021-01-29 NOTE — Telephone Encounter (Signed)
Spoke to patient, who stated that her oxygen concentrator is so loud that her spouse can hear it downstairs over the TV. She is unable to sleep due to the loud noise.  I have advised patient to contact [employer] and request that they come out and service machine.  She voiced her understanding and had no further questions.  She will call back with update.  Nothing further needed at this time.

## 2021-02-02 ENCOUNTER — Telehealth: Payer: Self-pay | Admitting: Family Medicine

## 2021-02-02 NOTE — Telephone Encounter (Signed)
Pharmacy requests refill on: Klor-Con 20 mEq  LAST REFILL: 09/14/2020 LAST OV: 11/18/2020 NEXT OV: 03/29/2021 PHARMACY: [id number] [place], [place] requests refill on: Losartan 100 mg   LAST REFILL: 11/10/2017 LAST OV: 11/18/2020 NEXT OV: 03/29/2021 PHARMACY: [facility] [place], [place]

## 2021-02-05 MED ORDER — VALSARTAN 160 MG PO TABS: 160.0000 mg | ORAL_TABLET | Freq: Every day | ORAL | 0 refills | Status: DC

## 2021-02-05 NOTE — Telephone Encounter (Signed)
I've sent valsartan to replace losartan due to back order.

## 2021-02-05 NOTE — Telephone Encounter (Signed)
Spoke with pt relaying Dr. G's message. Pt verbalizes understanding.  

## 2021-02-05 NOTE — Addendum Note (Signed)
Addended by: Ria Bush on: 02/05/2021 02:01 PM   Modules accepted: Orders

## 2021-02-05 NOTE — Telephone Encounter (Signed)
Pt came by office stating her pharmacy is unable to get her Losartan. She is wondering if there is a generic that she can be prescribed instead. Please advise.

## 2021-02-15 DIAGNOSIS — M79643 Pain in unspecified hand: Secondary | ICD-10-CM | POA: Diagnosis not present

## 2021-02-15 DIAGNOSIS — M797 Fibromyalgia: Secondary | ICD-10-CM | POA: Diagnosis not present

## 2021-02-15 DIAGNOSIS — M1990 Unspecified osteoarthritis, unspecified site: Secondary | ICD-10-CM | POA: Diagnosis not present

## 2021-02-15 DIAGNOSIS — Z79899 Other long term (current) drug therapy: Secondary | ICD-10-CM | POA: Diagnosis not present

## 2021-02-15 DIAGNOSIS — M810 Age-related osteoporosis without current pathological fracture: Secondary | ICD-10-CM | POA: Diagnosis not present

## 2021-02-15 DIAGNOSIS — M0579 Rheumatoid arthritis with rheumatoid factor of multiple sites without organ or systems involvement: Secondary | ICD-10-CM | POA: Diagnosis not present

## 2021-02-15 LAB — BASIC METABOLIC PANEL
Creatinine: 0.9 (ref 0.5–1.1)
Potassium: 4.2 (ref 3.4–5.3)
Sodium: 139 (ref 137–147)

## 2021-02-15 LAB — POCT ERYTHROCYTE SEDIMENTATION RATE, NON-AUTOMATED: Sed Rate: 2

## 2021-02-15 LAB — VITAMIN D 25 HYDROXY (VIT D DEFICIENCY, FRACTURES): Vit D, 25-Hydroxy: 69.4

## 2021-02-26 DIAGNOSIS — J449 Chronic obstructive pulmonary disease, unspecified: Secondary | ICD-10-CM | POA: Diagnosis not present

## 2021-03-13 ENCOUNTER — Other Ambulatory Visit: Payer: Self-pay | Admitting: Family Medicine

## 2021-03-13 DIAGNOSIS — Z1159 Encounter for screening for other viral diseases: Secondary | ICD-10-CM

## 2021-03-13 DIAGNOSIS — M810 Age-related osteoporosis without current pathological fracture: Secondary | ICD-10-CM

## 2021-03-13 DIAGNOSIS — M069 Rheumatoid arthritis, unspecified: Secondary | ICD-10-CM

## 2021-03-13 DIAGNOSIS — Z13220 Encounter for screening for lipoid disorders: Secondary | ICD-10-CM

## 2021-03-13 DIAGNOSIS — E041 Nontoxic single thyroid nodule: Secondary | ICD-10-CM

## 2021-03-15 ENCOUNTER — Other Ambulatory Visit: Payer: Self-pay

## 2021-03-15 ENCOUNTER — Other Ambulatory Visit (INDEPENDENT_AMBULATORY_CARE_PROVIDER_SITE_OTHER): Payer: Medicare Other

## 2021-03-15 DIAGNOSIS — E041 Nontoxic single thyroid nodule: Secondary | ICD-10-CM

## 2021-03-15 DIAGNOSIS — M069 Rheumatoid arthritis, unspecified: Secondary | ICD-10-CM

## 2021-03-15 DIAGNOSIS — Z13220 Encounter for screening for lipoid disorders: Secondary | ICD-10-CM | POA: Diagnosis not present

## 2021-03-15 DIAGNOSIS — Z1159 Encounter for screening for other viral diseases: Secondary | ICD-10-CM

## 2021-03-15 DIAGNOSIS — M810 Age-related osteoporosis without current pathological fracture: Secondary | ICD-10-CM | POA: Diagnosis not present

## 2021-03-15 LAB — CBC WITH DIFFERENTIAL/PLATELET
Basophils Absolute: 0 10*3/uL (ref 0.0–0.1)
Basophils Relative: 0.3 % (ref 0.0–3.0)
Eosinophils Absolute: 0.1 10*3/uL (ref 0.0–0.7)
Eosinophils Relative: 0.7 % (ref 0.0–5.0)
HCT: 37.8 % (ref 36.0–46.0)
Hemoglobin: 12.8 g/dL (ref 12.0–15.0)
Lymphocytes Relative: 22.5 % (ref 12.0–46.0)
Lymphs Abs: 1.7 10*3/uL (ref 0.7–4.0)
MCHC: 33.9 g/dL (ref 30.0–36.0)
MCV: 93.8 fl (ref 78.0–100.0)
Monocytes Absolute: 1.2 10*3/uL — ABNORMAL HIGH (ref 0.1–1.0)
Monocytes Relative: 16.2 % — ABNORMAL HIGH (ref 3.0–12.0)
Neutro Abs: 4.6 10*3/uL (ref 1.4–7.7)
Neutrophils Relative %: 60.3 % (ref 43.0–77.0)
Platelets: 316 10*3/uL (ref 150.0–400.0)
RBC: 4.03 Mil/uL (ref 3.87–5.11)
RDW: 13.4 % (ref 11.5–15.5)
WBC: 7.7 10*3/uL (ref 4.0–10.5)

## 2021-03-15 LAB — COMPREHENSIVE METABOLIC PANEL
ALT: 25 U/L (ref 0–35)
AST: 41 U/L — ABNORMAL HIGH (ref 0–37)
Albumin: 3.9 g/dL (ref 3.5–5.2)
Alkaline Phosphatase: 75 U/L (ref 39–117)
BUN: 25 mg/dL — ABNORMAL HIGH (ref 6–23)
CO2: 24 mEq/L (ref 19–32)
Calcium: 9.2 mg/dL (ref 8.4–10.5)
Chloride: 93 mEq/L — ABNORMAL LOW (ref 96–112)
Creatinine, Ser: 1.13 mg/dL (ref 0.40–1.20)
GFR: 52.25 mL/min — ABNORMAL LOW (ref >60.00)
Glucose, Bld: 103 mg/dL — ABNORMAL HIGH (ref 70–99)
Potassium: 4.6 mEq/L (ref 3.5–5.1)
Sodium: 130 mEq/L — ABNORMAL LOW (ref 135–145)
Total Bilirubin: 1.2 mg/dL (ref 0.2–1.2)
Total Protein: 6.9 g/dL (ref 6.0–8.3)

## 2021-03-15 LAB — LIPID PANEL
Cholesterol: 245 mg/dL — ABNORMAL HIGH (ref 0–200)
HDL: 110.3 mg/dL (ref >39.00)
LDL Cholesterol: 118 mg/dL — ABNORMAL HIGH (ref 0–99)
NonHDL: 134.97
Total CHOL/HDL Ratio: 2
Triglycerides: 85 mg/dL (ref 0.0–149.0)
VLDL: 17 mg/dL (ref 0.0–40.0)

## 2021-03-15 LAB — T4, FREE: Free T4: 1.04 ng/dL (ref 0.60–1.60)

## 2021-03-15 LAB — TSH: TSH: 0.76 u[IU]/mL (ref 0.35–4.50)

## 2021-03-15 LAB — VITAMIN D 25 HYDROXY (VIT D DEFICIENCY, FRACTURES): VITD: 86.41 ng/mL (ref 30.00–100.00)

## 2021-03-16 LAB — HEPATITIS C ANTIBODY
Hepatitis C Ab: NONREACTIVE
SIGNAL TO CUT-OFF: 0.02 (ref <1.00)

## 2021-03-16 LAB — T3: T3, Total: 79 ng/dL (ref 76–181)

## 2021-03-22 ENCOUNTER — Encounter: Payer: Medicare Other | Admitting: Family Medicine

## 2021-03-26 ENCOUNTER — Ambulatory Visit (INDEPENDENT_AMBULATORY_CARE_PROVIDER_SITE_OTHER): Payer: Medicare Other

## 2021-03-26 ENCOUNTER — Other Ambulatory Visit: Payer: Self-pay

## 2021-03-26 DIAGNOSIS — Z0000 Encounter for general adult medical examination without abnormal findings: Secondary | ICD-10-CM | POA: Diagnosis not present

## 2021-03-26 DIAGNOSIS — Z0100 Encounter for examination of eyes and vision without abnormal findings: Secondary | ICD-10-CM

## 2021-03-26 NOTE — Progress Notes (Signed)
PCP notes:  Health Maintenance: Colonoscopy- due Mammogram- due Covid booster- due  Abnormal Screenings: none   Patient concerns: none   Nurse concerns: none   Next PCP appt.: 03/29/2021 @ 10 am

## 2021-03-26 NOTE — Progress Notes (Signed)
Subjective:   Kim Braun is a 60 y.o. female who presents for Medicare Annual (Subsequent) preventive examination.  Review of Systems: N/A      I connected with the patient today by telephone and verified that I am speaking with the correct person using two identifiers. Location patient: home Location nurse: work Persons participating in the telephone visit: patient, nurse.   I discussed the limitations, risks, security and privacy concerns of performing an evaluation and management service by telephone and the availability of in person appointments. I also discussed with the patient that there may be a patient responsible charge related to this service. The patient expressed understanding and verbally consented to this telephonic visit.        Cardiac Risk Factors include: advanced age (>11men, >69 women);hypertension;smoking/ tobacco exposure     Objective:    Today's Vitals   There is no height or weight on file to calculate BMI.  Advanced Directives 03/26/2021 09/30/2014 07/15/2013 12/19/2012 12/03/2012  Does Patient Have a Medical Advance Directive? No No Patient does not have advance directive Patient does not have advance directive Patient does not have advance directive  Would patient like information on creating a medical advance directive? No - Patient declined No - patient declined information - - -  Pre-existing out of facility DNR order (yellow form or pink MOST form) - - - No -    Current Medications (verified) Outpatient Encounter Medications as of 03/26/2021  Medication Sig  . Adalimumab (HUMIRA PEN) 40 MG/0.4ML PNKT Inject 40mg  subcutaneously every *OTHER week as directed  . albuterol (PROVENTIL HFA;VENTOLIN HFA) 108 (90 BASE) MCG/ACT inhaler Inhale 2 puffs into the lungs daily as needed for wheezing or shortness of breath.  Marland Kitchen alendronate (FOSAMAX) 70 MG tablet Take 70 mg by mouth once a week.  . betamethasone dipropionate 0.05 % cream Apply topically 2 (two)  times daily.  . Biotin 10 MG TABS 1 tablet  . CALCIUM-VITAMIN D PO Take by mouth daily. Calcium 600 mg + vit D3  . Cholecalciferol (VITAMIN D3 PO) Take 2,000 Units by mouth.  Marland Kitchen KLOR-CON M20 20 MEQ tablet TAKE 1 TABLET BY MOUTH EVERY DAY  . L-Lysine 500 MG TABS Take by mouth daily.  . Tiotropium Bromide Monohydrate (SPIRIVA RESPIMAT) 2.5 MCG/ACT AERS Inhale 2 puffs into the lungs daily.  . valsartan (DIOVAN) 160 MG tablet Take 1 tablet (160 mg total) by mouth daily.  Marland Kitchen losartan (COZAAR) 100 MG tablet TAKE 1 TABLET BY MOUTH EVERY DAY (Patient not taking: Reported on 03/26/2021)   No facility-administered encounter medications on file as of 03/26/2021.    Allergies (verified) Patient has no known allergies.   History: Past Medical History:  Diagnosis Date  . COPD (chronic obstructive pulmonary disease) ([place]) 01/2019  . History of cardiac murmur as a child    child  . History of gestational diabetes   . Hypertension   . Left bundle branch block    11/2012 low risk, no ischemia, NL EF stress test (Dr. Marlou Porch)  . Rheumatoid arthritis(714.0)   . Smoker   . Thyroid nodule    Past Surgical History:  Procedure Laterality Date  . ABDOMINAL HYSTERECTOMY  12/19/2012   Procedure: HYSTERECTOMY ABDOMINAL;  Surgeon: Maeola Sarah. Landry Mellow, MD;  Location: [facility];  Service: Gynecology;  Laterality: N/A;  Incision @ [id number]  . ANTERIOR CERVICAL DECOMP/DISCECTOMY FUSION N/A 07/17/2013   Cervical four-five, Cervical five-six Anterior cervical decompression/diskectomy/fusion;  Surgeon: Floyce Stakes, MD;  Location: [facility];  Service: Neurosurgery;  Laterality: N/A;  Cervical four-five, Cervical five-six Anterior cervical decompression/diskectomy/fusion  . BILATERAL SALPINGECTOMY  12/19/2012   Procedure: BILATERAL SALPINGECTOMY;  Surgeon: Maeola Sarah. Landry Mellow, MD;  Location: [facility];  Service: Gynecology;  Laterality: Bilateral;  . CARDIAC CATHETERIZATION  09/30/2014   Procedure: LEFT HEART CATH AND CORONARY ANGIOGRAPHY;   Surgeon: Laverda Page, MD;  Location: [facility] CATH LAB;  Service: Cardiovascular;;  . CESAREAN SECTION    . COLON SURGERY  1992  . CYSTOSCOPY  12/19/2012   Procedure: CYSTOSCOPY;  Surgeon: Maeola Sarah. Landry Mellow, MD;  Location: [facility];  Service: Gynecology;  Laterality: N/A;  . ROBOTIC ASSISTED TOTAL HYSTERECTOMY  12/19/2012   total hysterectomy for fibroids, ovaries removed [name])   Family History  Problem Relation Age of Onset  . Stroke Mother   . CAD Mother        MI  . Diabetes Sister   . Hypertension Brother   . CAD Brother        MI  . Hypertension Brother   . Hypertension Sister   . Cancer Neg Hx    Social History   Socioeconomic History  . Marital status: Married    Spouse name: Not on file  . Number of children: Not on file  . Years of education: Not on file  . Highest education level: Not on file  Occupational History  . Not on file  Tobacco Use  . Smoking status: Current Every Day Smoker    Packs/day: 1.00    Years: 44.00    Pack years: 44.00    Types: Cigarettes  . Smokeless tobacco: Never Used  . Tobacco comment: smoking 1/2 ppd.  very stressed.  Substance and Sexual Activity  . Alcohol use: Yes    Alcohol/week: 14.0 - 24.0 standard drinks    Types: 14 - 24 Cans of beer per week  . Drug use: No  . Sexual activity: Not on file  Other Topics Concern  . Not on file  Social History Narrative   Lives with husband Ovidio Hanger: retired, was in Science writer   Activity:    Diet:    Social Determinants of Radio broadcast assistant Strain: Low Risk   . Difficulty of Paying Living Expenses: Not hard at all  Food Insecurity: No Food Insecurity  . Worried About Charity fundraiser in the Last Year: Never true  . Ran Out of Food in the Last Year: Never true  Transportation Needs: No Transportation Needs  . Lack of Transportation (Medical): No  . Lack of Transportation (Non-Medical): No  Physical Activity: Inactive  . Days of Exercise per Week: 0 days  .  Minutes of Exercise per Session: 0 min  Stress: No Stress Concern Present  . Feeling of Stress : Not at all  Social Connections: Not on file    Tobacco Counseling Ready to quit: Not Answered Counseling given: Not Answered Comment: smoking 1/2 ppd.  very stressed.   Clinical Intake:  Pre-visit preparation completed: Yes  Pain : No/denies pain     Nutritional Risks: None Diabetes: No  How often do you need to have someone help you when you read instructions, pamphlets, or other written materials from your doctor or pharmacy?: 1 - Never  Diabetic: no Nutrition Risk Assessment:  Has the patient had any N/V/D within the last 2 months?  No  Does the patient have any non-healing wounds?  No  Has the patient had any  unintentional weight loss or weight gain?  No   Diabetes:  Is the patient diabetic?  No  If diabetic, was a CBG obtained today?  N/A Did the patient bring in their glucometer from home?  N/A How often do you monitor your CBG's? N/A.   Financial Strains and Diabetes Management:  Are you having any financial strains with the device, your supplies or your medication? N/A.  Does the patient want to be seen by Chronic Care Management for management of their diabetes?  N/A Would the patient like to be referred to a Nutritionist or for Diabetic Management?  N/A Interpreter Needed?: No  Information entered by :: CJohnson, LPN   Activities of Daily Living In your present state of health, do you have any difficulty performing the following activities: 03/26/2021  Hearing? N  Vision? N  Difficulty concentrating or making decisions? N  Walking or climbing stairs? N  Dressing or bathing? N  Doing errands, shopping? N  Preparing Food and eating ? N  Using the Toilet? N  In the past six months, have you accidently leaked urine? N  Do you have problems with loss of bowel control? N  Managing your Medications? N  Managing your Finances? N  Housekeeping or managing  your Housekeeping? N  Some recent data might be hidden    Patient Care Team: Ria Bush, MD as PCP - General (Family Medicine) Christophe Louis, MD as Consulting Physician (Obstetrics and Gynecology)  Indicate any recent Medical Services you may have received from other than [facility] providers in the past year (date may be approximate).     Assessment:   This is a routine wellness examination for Jaimi.  Hearing/Vision screen  Hearing Screening   125Hz  250Hz  500Hz  1000Hz  2000Hz  3000Hz  4000Hz  6000Hz  8000Hz   Right ear:           Left ear:           Vision Screening Comments: Advised patient to get annual eye exams   Dietary issues and exercise activities discussed: Current Exercise Habits: The patient does not participate in regular exercise at present, Exercise limited by: None identified  Goals    . Patient Stated     03/26/2021, I will maintain and continue medications as prescribed.       Depression Screen PHQ 2/9 Scores 03/26/2021 11/18/2020  PHQ - 2 Score 0 1  PHQ- 9 Score 0 2    Fall Risk Fall Risk  03/26/2021  Falls in the past year? 0  Number falls in past yr: 0  Injury with Fall? 0  Risk for fall due to : Medication side effect  Follow up Falls evaluation completed;Falls prevention discussed    FALL RISK PREVENTION PERTAINING TO THE HOME:  Any stairs in or around the home? Yes  If so, are there any without handrails? No  Home free of loose throw rugs in walkways, pet beds, electrical cords, etc? Yes  Adequate lighting in your home to reduce risk of falls? Yes   ASSISTIVE DEVICES UTILIZED TO PREVENT FALLS:  Life alert? No  Use of a cane, walker or w/c? No  Grab bars in the bathroom? No  Shower chair or bench in shower? No  Elevated toilet seat or a handicapped toilet? No   TIMED UP AND GO:  Was the test performed? N/A telephone visit .   Cognitive Function: MMSE - Mini Mental State Exam 03/26/2021 12/07/2017  Not completed: Refused -  Orientation to  time - 5  Orientation  to Place - 5  Registration - 3  Attention/ Calculation - 0  Recall - 2  Language- name 2 objects - 2  Language- repeat - 1  Language- follow 3 step command - 3  Language- read & follow direction - 1  Write a sentence - 0  Copy design - 0  Total score - 22  Mini Cog  Mini-Cog screen was not completed. Patient refused. Maximum score is 22. A value of 0 denotes this part of the MMSE was not completed or the patient failed this part of the Mini-Cog screening.       Immunizations Immunization History  Administered Date(s) Administered  . Influenza, Quadrivalent, Recombinant, Inj, Pf 09/08/2017, 08/17/2020  . Influenza,inj,Quad PF,6-35 Mos 08/16/2019  . PFIZER(Purple Top)SARS-COV-2 Vaccination 02/12/2020, 03/04/2020  . Pneumococcal Polysaccharide-23 12/20/2012  . Td 07/28/2005    TDAP status: Due, Education has been provided regarding the importance of this vaccine. Advised may receive this vaccine at local pharmacy or Health Dept. Aware to provide a copy of the vaccination record if obtained from local pharmacy or Health Dept. Verbalized acceptance and understanding.  Flu Vaccine status: Up to date  Pneumococcal vaccine status: Up to date  Covid-19 vaccine status: 2 doses completed, booster due. Patient aware  Qualifies for Shingles Vaccine? Yes   Zostavax completed No   Shingrix Completed?: No.    Education has been provided regarding the importance of this vaccine. Patient has been advised to call insurance company to determine out of pocket expense if they have not yet received this vaccine. Advised may also receive vaccine at local pharmacy or Health Dept. Verbalized acceptance and understanding.  Screening Tests Health Maintenance  Topic Date Due  . COLONOSCOPY (Pts 45-34yrs Insurance coverage will need to be confirmed)  Never done  . MAMMOGRAM  07/30/2011  . TETANUS/TDAP  07/29/2015  . PAP SMEAR-Modifier  08/22/2015  . COVID-19 Vaccine (3 -  Booster for Pfizer series) 09/03/2020  . INFLUENZA VACCINE  06/28/2021  . Hepatitis C Screening  Completed  . HIV Screening  Completed  . HPV VACCINES  Aged Out    Health Maintenance  Health Maintenance Due  Topic Date Due  . COLONOSCOPY (Pts 45-65yrs Insurance coverage will need to be confirmed)  Never done  . MAMMOGRAM  07/30/2011  . TETANUS/TDAP  07/29/2015  . PAP SMEAR-Modifier  08/22/2015  . COVID-19 Vaccine (3 - Booster for Pfizer series) 09/03/2020    Colorectal cancer screening: due, will discuss with provider  Mammogram status:due, will discuss with provider  Bone Density status: at age 68   Lung Cancer Screening: (Low Dose CT Chest recommended if Age 68-80 years, 30 pack-year currently smoking OR have quit w/in 15years.) does qualify.   Lung Cancer Screening Referral: 01/06/2021  Additional Screening:  Hepatitis C Screening: does qualify; Completed 03/15/2021  Vision Screening: Recommended annual ophthalmology exams for early detection of glaucoma and other disorders of the eye. Is the patient up to date with their annual eye exam?  No  Who is the provider or what is the name of the office in which the patient attends annual eye exams? Needs referral to eye doctor If pt is not established with a provider, would they like to be referred to a provider to establish care? Yes.   Dental Screening: Recommended annual dental exams for proper oral hygiene  Community Resource Referral / Chronic Care Management: CRR required this visit?  No   CCM required this visit?  No  Plan:     I have personally reviewed and noted the following in the patient's chart:   . Medical and social history . Use of alcohol, tobacco or illicit drugs  . Current medications and supplements . Functional ability and status . Nutritional status . Physical activity . Advanced directives . List of other physicians . Hospitalizations, surgeries, and ER visits in previous 12  months . Vitals . Screenings to include cognitive, depression, and falls . Referrals and appointments  In addition, I have reviewed and discussed with patient certain preventive protocols, quality metrics, and best practice recommendations. A written personalized care plan for preventive services as well as general preventive health recommendations were provided to patient.   Due to this being a telephonic visit, the after visit summary with patients personalized plan was offered to patient via office or my-chart. Patient preferred to pick up at office at next visit or via mychart.   Andrez Grime, LPN   05/19/6332

## 2021-03-26 NOTE — Patient Instructions (Signed)
Kim Braun , Thank you for taking time to come for your Medicare Wellness Visit. I appreciate your ongoing commitment to your health goals. Please review the following plan we discussed and let me know if I can assist you in the future.   Screening recommendations/referrals: Colonoscopy: due, will discuss with provider Mammogram: due, will discuss with provider Bone Density: at age 60 Recommended yearly ophthalmology/optometry visit for glaucoma screening and checkup Recommended yearly dental visit for hygiene and checkup  Vaccinations: Influenza vaccine: Up to date, completed 08/17/2020, due 06/2021 Pneumococcal vaccine: Up to date, completed 12/20/2012, due at age 13  Tdap vaccine: decline-insurance  Shingles vaccine: due, check with your insurance regarding coverage if interested   Covid-19: 2 doses completed, booster due  Advanced directives: Advance directive discussed with you today. Even though you declined this today please call our office should you change your mind and we can give you the proper paperwork for you to fill out.   Conditions/risks identified: hypertension  Next appointment: Follow up in one year for your annual wellness visit.   Preventive Care 40-64 Years, Female Preventive care refers to lifestyle choices and visits with your health care provider that can promote health and wellness. What does preventive care include?  A yearly physical exam. This is also called an annual well check.  Dental exams once or twice a year.  Routine eye exams. Ask your health care provider how often you should have your eyes checked.  Personal lifestyle choices, including:  Daily care of your teeth and gums.  Regular physical activity.  Eating a healthy diet.  Avoiding tobacco and drug use.  Limiting alcohol use.  Practicing safe sex.  Taking low-dose aspirin daily starting at age 65.  Taking vitamin and mineral supplements as recommended by your health care  provider. What happens during an annual well check? The services and screenings done by your health care provider during your annual well check will depend on your age, overall health, lifestyle risk factors, and family history of disease. Counseling  Your health care provider may ask you questions about your:  Alcohol use.  Tobacco use.  Drug use.  Emotional well-being.  Home and relationship well-being.  Sexual activity.  Eating habits.  Work and work Statistician.  Method of birth control.  Menstrual cycle.  Pregnancy history. Screening  You may have the following tests or measurements:  Height, weight, and BMI.  Blood pressure.  Lipid and cholesterol levels. These may be checked every 5 years, or more frequently if you are over 44 years old.  Skin check.  Lung cancer screening. You may have this screening every year starting at age 60 if you have a 30-pack-year history of smoking and currently smoke or have quit within the past 15 years.  Fecal occult blood test (FOBT) of the stool. You may have this test every year starting at age 68.  Flexible sigmoidoscopy or colonoscopy. You may have a sigmoidoscopy every 5 years or a colonoscopy every 10 years starting at age 75.  Hepatitis C blood test.  Hepatitis B blood test.  Sexually transmitted disease (STD) testing.  Diabetes screening. This is done by checking your blood sugar (glucose) after you have not eaten for a while (fasting). You may have this done every 1-3 years.  Mammogram. This may be done every 1-2 years. Talk to your health care provider about when you should start having regular mammograms. This may depend on whether you have a family history of breast cancer.  BRCA-related cancer screening. This may be done if you have a family history of breast, ovarian, tubal, or peritoneal cancers.  Pelvic exam and Pap test. This may be done every 3 years starting at age 45. Starting at age 58, this may be  done every 5 years if you have a Pap test in combination with an HPV test.  Bone density scan. This is done to screen for osteoporosis. You may have this scan if you are at high risk for osteoporosis. Discuss your test results, treatment options, and if necessary, the need for more tests with your health care provider. Vaccines  Your health care provider may recommend certain vaccines, such as:  Influenza vaccine. This is recommended every year.  Tetanus, diphtheria, and acellular pertussis (Tdap, Td) vaccine. You may need a Td booster every 10 years.  Zoster vaccine. You may need this after age 69.  Pneumococcal 13-valent conjugate (PCV13) vaccine. You may need this if you have certain conditions and were not previously vaccinated.  Pneumococcal polysaccharide (PPSV23) vaccine. You may need one or two doses if you smoke cigarettes or if you have certain conditions. Talk to your health care provider about which screenings and vaccines you need and how often you need them. This information is not intended to replace advice given to you by your health care provider. Make sure you discuss any questions you have with your health care provider. Document Released: 12/11/2015 Document Revised: 08/03/2016 Document Reviewed: 09/15/2015 Elsevier Interactive Patient Education  2017 [place] Prevention in the Home Falls can cause injuries. They can happen to people of all ages. There are many things you can do to make your home safe and to help prevent falls. What can I do on the outside of my home?  Regularly fix the edges of walkways and driveways and fix any cracks.  Remove anything that might make you trip as you walk through a door, such as a raised step or threshold.  Trim any bushes or trees on the path to your home.  Use bright outdoor lighting.  Clear any walking paths of anything that might make someone trip, such as rocks or tools.  Regularly check to see if  handrails are loose or broken. Make sure that both sides of any steps have handrails.  Any raised decks and porches should have guardrails on the edges.  Have any leaves, snow, or ice cleared regularly.  Use sand or salt on walking paths during winter.  Clean up any spills in your garage right away. This includes oil or grease spills. What can I do in the bathroom?  Use night lights.  Install grab bars by the toilet and in the tub and shower. Do not use towel bars as grab bars.  Use non-skid mats or decals in the tub or shower.  If you need to sit down in the shower, use a plastic, non-slip stool.  Keep the floor dry. Clean up any water that spills on the floor as soon as it happens.  Remove soap buildup in the tub or shower regularly.  Attach bath mats securely with double-sided non-slip rug tape.  Do not have throw rugs and other things on the floor that can make you trip. What can I do in the bedroom?  Use night lights.  Make sure that you have a light by your bed that is easy to reach.  Do not use any sheets or blankets that are too big for your bed. They  should not hang down onto the floor.  Have a firm chair that has side arms. You can use this for support while you get dressed.  Do not have throw rugs and other things on the floor that can make you trip. What can I do in the kitchen?  Clean up any spills right away.  Avoid walking on wet floors.  Keep items that you use a lot in easy-to-reach places.  If you need to reach something above you, use a strong step stool that has a grab bar.  Keep electrical cords out of the way.  Do not use floor polish or wax that makes floors slippery. If you must use wax, use non-skid floor wax.  Do not have throw rugs and other things on the floor that can make you trip. What can I do with my stairs?  Do not leave any items on the stairs.  Make sure that there are handrails on both sides of the stairs and use them. Fix  handrails that are broken or loose. Make sure that handrails are as long as the stairways.  Check any carpeting to make sure that it is firmly attached to the stairs. Fix any carpet that is loose or worn.  Avoid having throw rugs at the top or bottom of the stairs. If you do have throw rugs, attach them to the floor with carpet tape.  Make sure that you have a light switch at the top of the stairs and the bottom of the stairs. If you do not have them, ask someone to add them for you. What else can I do to help prevent falls?  Wear shoes that:  Do not have high heels.  Have rubber bottoms.  Are comfortable and fit you well.  Are closed at the toe. Do not wear sandals.  If you use a stepladder:  Make sure that it is fully opened. Do not climb a closed stepladder.  Make sure that both sides of the stepladder are locked into place.  Ask someone to hold it for you, if possible.  Clearly mark and make sure that you can see:  Any grab bars or handrails.  First and last steps.  Where the edge of each step is.  Use tools that help you move around (mobility aids) if they are needed. These include:  Canes.  Walkers.  Scooters.  Crutches.  Turn on the lights when you go into a dark area. Replace any light bulbs as soon as they burn out.  Set up your furniture so you have a clear path. Avoid moving your furniture around.  If any of your floors are uneven, fix them.  If there are any pets around you, be aware of where they are.  Review your medicines with your doctor. Some medicines can make you feel dizzy. This can increase your chance of falling. Ask your doctor what other things that you can do to help prevent falls. This information is not intended to replace advice given to you by your health care provider. Make sure you discuss any questions you have with your health care provider. Document Released: 09/10/2009 Document Revised: 04/21/2016 Document Reviewed:  12/19/2014 Elsevier Interactive Patient Education  2017 [employer].

## 2021-03-28 DIAGNOSIS — J449 Chronic obstructive pulmonary disease, unspecified: Secondary | ICD-10-CM | POA: Diagnosis not present

## 2021-03-29 ENCOUNTER — Encounter: Payer: Self-pay | Admitting: Family Medicine

## 2021-03-29 ENCOUNTER — Ambulatory Visit (INDEPENDENT_AMBULATORY_CARE_PROVIDER_SITE_OTHER): Payer: Medicare Other | Admitting: Family Medicine

## 2021-03-29 ENCOUNTER — Other Ambulatory Visit: Payer: Self-pay

## 2021-03-29 ENCOUNTER — Other Ambulatory Visit: Payer: Self-pay | Admitting: Family Medicine

## 2021-03-29 VITALS — BP 118/74 | HR 86 | Temp 98.1°F | Ht 62.0 in | Wt 106.0 lb

## 2021-03-29 DIAGNOSIS — N289 Disorder of kidney and ureter, unspecified: Secondary | ICD-10-CM | POA: Diagnosis not present

## 2021-03-29 DIAGNOSIS — Z1231 Encounter for screening mammogram for malignant neoplasm of breast: Secondary | ICD-10-CM

## 2021-03-29 DIAGNOSIS — Z0000 Encounter for general adult medical examination without abnormal findings: Secondary | ICD-10-CM | POA: Diagnosis not present

## 2021-03-29 DIAGNOSIS — Z1211 Encounter for screening for malignant neoplasm of colon: Secondary | ICD-10-CM | POA: Diagnosis not present

## 2021-03-29 DIAGNOSIS — I447 Left bundle-branch block, unspecified: Secondary | ICD-10-CM

## 2021-03-29 DIAGNOSIS — I10 Essential (primary) hypertension: Secondary | ICD-10-CM | POA: Diagnosis not present

## 2021-03-29 DIAGNOSIS — R918 Other nonspecific abnormal finding of lung field: Secondary | ICD-10-CM | POA: Diagnosis not present

## 2021-03-29 DIAGNOSIS — Z7189 Other specified counseling: Secondary | ICD-10-CM | POA: Diagnosis not present

## 2021-03-29 DIAGNOSIS — M810 Age-related osteoporosis without current pathological fracture: Secondary | ICD-10-CM | POA: Diagnosis not present

## 2021-03-29 DIAGNOSIS — E7800 Pure hypercholesterolemia, unspecified: Secondary | ICD-10-CM | POA: Diagnosis not present

## 2021-03-29 DIAGNOSIS — F17200 Nicotine dependence, unspecified, uncomplicated: Secondary | ICD-10-CM | POA: Diagnosis not present

## 2021-03-29 DIAGNOSIS — E785 Hyperlipidemia, unspecified: Secondary | ICD-10-CM | POA: Insufficient documentation

## 2021-03-29 DIAGNOSIS — J432 Centrilobular emphysema: Secondary | ICD-10-CM | POA: Diagnosis not present

## 2021-03-29 DIAGNOSIS — M069 Rheumatoid arthritis, unspecified: Secondary | ICD-10-CM

## 2021-03-29 DIAGNOSIS — I700 Atherosclerosis of aorta: Secondary | ICD-10-CM | POA: Diagnosis not present

## 2021-03-29 MED ORDER — VALSARTAN 80 MG PO TABS: 80.0000 mg | ORAL_TABLET | Freq: Every day | ORAL | 11 refills | Status: DC

## 2021-03-29 MED ORDER — POTASSIUM CHLORIDE CRYS ER 10 MEQ PO TBCR: 10.0000 meq | EXTENDED_RELEASE_TABLET | Freq: Every day | ORAL | 3 refills | Status: DC

## 2021-03-29 NOTE — Patient Instructions (Addendum)
Pass by lab to pick up stool kit. Call to schedule mammogram at your convenience: [place] [phone].  We will request bone density scan and recent labs from Dr [facility].  If interested, check with pharmacy about new 2 shot shingles series (shingrix).  Drop valsartan dose to $Remov'80mg'UTyJxn$  daily (cut current dose in half until you run out, new dose at pharmacy). Drop potassium dose to 10 mEq daily (new dose at pharmacy) Schedule eye doctor appointment. Advanced directive packet provided today  Return as needed or in 1 year for next wellness visit/physical. RTC 6 mo f/u visit   Health Maintenance for Postmenopausal Women Menopause is a normal process in which your ability to get pregnant comes to an end. This process happens slowly over many months or years, usually between the ages of 79 and 62. Menopause is complete when you have missed your menstrual periods for 12 months. It is important to talk with your health care provider about some of the most common conditions that affect women after menopause (postmenopausal women). These include heart disease, cancer, and bone loss (osteoporosis). Adopting a healthy lifestyle and getting preventive care can help to promote your health and wellness. The actions you take can also lower your chances of developing some of these common conditions. What should I know about menopause? During menopause, you may get a number of symptoms, such as:  Hot flashes. These can be moderate or severe.  Night sweats.  Decrease in sex drive.  Mood swings.  Headaches.  Tiredness.  Irritability.  Memory problems.  Insomnia. Choosing to treat or not to treat these symptoms is a decision that you make with your health care provider. Do I need hormone replacement therapy?  Hormone replacement therapy is effective in treating symptoms that are caused by menopause, such as hot flashes and night sweats.  Hormone replacement carries  certain risks, especially as you become older. If you are thinking about using estrogen or estrogen with progestin, discuss the benefits and risks with your health care provider. What is my risk for heart disease and stroke? The risk of heart disease, heart attack, and stroke increases as you age. One of the causes may be a change in the body's hormones during menopause. This can affect how your body uses dietary fats, triglycerides, and cholesterol. Heart attack and stroke are medical emergencies. There are many things that you can do to help prevent heart disease and stroke. Watch your blood pressure  High blood pressure causes heart disease and increases the risk of stroke. This is more likely to develop in people who have high blood pressure readings, are of African descent, or are overweight.  Have your blood pressure checked: ? Every 3-5 years if you are 52-58 years of age. ? Every year if you are 55 years old or older. Eat a healthy diet  Eat a diet that includes plenty of vegetables, fruits, low-fat dairy products, and lean protein.  Do not eat a lot of foods that are high in solid fats, added sugars, or sodium.   Get regular exercise Get regular exercise. This is one of the most important things you can do for your health. Most adults should:  Try to exercise for at least 150 minutes each week. The exercise should increase your heart rate and make you sweat (moderate-intensity exercise).  Try to do strengthening exercises at least twice each week. Do these in addition to the moderate-intensity exercise.  Spend less time sitting. Even light  physical activity can be beneficial. Other tips  Work with your health care provider to achieve or maintain a healthy weight.  Do not use any products that contain nicotine or tobacco, such as cigarettes, e-cigarettes, and chewing tobacco. If you need help quitting, ask your health care provider.  Know your numbers. Ask your health care  provider to check your cholesterol and your blood sugar (glucose). Continue to have your blood tested as directed by your health care provider. Do I need screening for cancer? Depending on your health history and family history, you may need to have cancer screening at different stages of your life. This may include screening for:  Breast cancer.  Cervical cancer.  Lung cancer.  Colorectal cancer. What is my risk for osteoporosis? After menopause, you may be at increased risk for osteoporosis. Osteoporosis is a condition in which bone destruction happens more quickly than new bone creation. To help prevent osteoporosis or the bone fractures that can happen because of osteoporosis, you may take the following actions:  If you are 16-107 years old, get at least 1,000 mg of calcium and at least 600 mg of vitamin D per day.  If you are older than age 47 but younger than age 22, get at least 1,200 mg of calcium and at least 600 mg of vitamin D per day.  If you are older than age 47, get at least 1,200 mg of calcium and at least 800 mg of vitamin D per day. Smoking and drinking excessive alcohol increase the risk of osteoporosis. Eat foods that are rich in calcium and vitamin D, and do weight-bearing exercises several times each week as directed by your health care provider. How does menopause affect my mental health? Depression may occur at any age, but it is more common as you become older. Common symptoms of depression include:  Low or sad mood.  Changes in sleep patterns.  Changes in appetite or eating patterns.  Feeling an overall lack of motivation or enjoyment of activities that you previously enjoyed.  Frequent crying spells. Talk with your health care provider if you think that you are experiencing depression. General instructions See your health care provider for regular wellness exams and vaccines. This may include:  Scheduling regular health, dental, and eye exams.  Getting  and maintaining your vaccines. These include: ? Influenza vaccine. Get this vaccine each year before the flu season begins. ? Pneumonia vaccine. ? Shingles vaccine. ? Tetanus, diphtheria, and pertussis (Tdap) booster vaccine. Your health care provider may also recommend other immunizations. Tell your health care provider if you have ever been abused or do not feel safe at home. Summary  Menopause is a normal process in which your ability to get pregnant comes to an end.  This condition causes hot flashes, night sweats, decreased interest in sex, mood swings, headaches, or lack of sleep.  Treatment for this condition may include hormone replacement therapy.  Take actions to keep yourself healthy, including exercising regularly, eating a healthy diet, watching your weight, and checking your blood pressure and blood sugar levels.  Get screened for cancer and depression. Make sure that you are up to date with all your vaccines. This information is not intended to replace advice given to you by your health care provider. Make sure you discuss any questions you have with your health care provider. Document Revised: 11/07/2018 Document Reviewed: 11/07/2018 Elsevier Patient Education  2021 [employer].

## 2021-03-29 NOTE — Assessment & Plan Note (Signed)
Thoracic aorta ATH as well as coronary artery calcification. Update EKG. Discussed statin use.

## 2021-03-29 NOTE — Assessment & Plan Note (Addendum)
Has established with pulm - appreciate their care.  Now on spiriva.

## 2021-03-29 NOTE — Assessment & Plan Note (Signed)
Chronic. Reviewed ASCVD - high HDL partly drives hyperlipidemia. With ATH consider statin. Will defer for now.  The ASCVD Risk score Mikey Bussing DC Jr., et al., 2013) failed to calculate for the following reasons:   The valid HDL cholesterol range is 20 to 100 mg/dL

## 2021-03-29 NOTE — Assessment & Plan Note (Addendum)
Chronic. ?Recent overtreatment with recent low readings + bump in creatinine + change from losartan to valsartan due to national shortage. Will drop valsartan dose to 80mg  daily, reassess kidney function in 1 month. Reviewed importance of good hydration however not going overboard with recent hyponatremia noted.  Will also drop potassium dose from 68mEq to 47mEq as pt has had difficulty swallowing large pills.

## 2021-03-29 NOTE — Assessment & Plan Note (Signed)
Appreciate rheum care.

## 2021-03-29 NOTE — Assessment & Plan Note (Signed)
Continue fosamax - followed by rheum, pending rpt DEXA

## 2021-03-29 NOTE — Progress Notes (Signed)
Patient ID: Kim Braun, female    DOB: [birth year], 60 y.o.   MRN: [id number]  This visit was conducted in person.  BP 118/74   Pulse 86   Temp 98.1 F (36.7 C) (Temporal)   Ht $R'5\' 2"'dZ$  (1.575 m)   Wt 106 lb (48.1 kg)   LMP 12/03/2012   SpO2 100%   BMI 19.39 kg/m   BP Readings from Last 3 Encounters:  03/29/21 118/74  12/17/20 105/80  11/18/20 120/74    CC: CPE  Subjective:   HPI: Kim Braun is a 60 y.o. female presenting on 03/29/2021 for Medicare Wellness   Saw health advisor last week for medicare wellness visit. Note reviewed.    Hearing Screening   '125Hz'$  $Remo'250Hz'skESb$'500Hz'$'1000Hz'$'2000Hz'$'3000Hz'$'4000Hz'$'6000Hz'$'8000Hz'$   Right ear:   '20 20 25  25    '$ Left ear:   '20 25 20  '$ 40      Visual Acuity Screening   Right eye Left eye Both eyes  Without correction:     With correction: $RemoveBeforeDE'20/25 20/40 20/25 'cuQuaCgbtyiwEuN$    Flowsheet Row Office Visit from 03/29/2021 in [facility]  PHQ-2 Total Score 0      Fall Risk  03/29/2021 03/26/2021  Falls in the past year? 0 0  Number falls in past yr: - 0  Injury with Fall? - 0  Risk for fall due to : - Medication side effect  Follow up - Falls evaluation completed;Falls prevention discussed   Known rheumatoid arthritis on Humira rheumatologist Dr Kathlene November.   OP - on fosamax $RemoveBe'70mg'dQrnplXXp$  weekly. Last DEXA 07/2020 through rheum. Also takes calcium/vit D + vit D. Lactose intolerant - avoids dairy.   COPD - on spiriva saw pulm.   Overnight hypoxia - recently started on supplemental O2 for bedtime.   Preventative: Colon cancer screening - discussed options - would like ifob. Breast cancer screening - overdue (2010) will call to schedule [facility] exam - s/p total hysterectomy (Dr Landry Mellow), ovaries removed as well. Bad results.  DEXA scan 07/2020 through Rheum on fosamax - planned rpt next visit.  Lung cancer screening - undergoing, first 12/2020  Flu shot - yearly [place] 01/2020, 02/2020 Td 2006  Pneumovax  2014 Shingrix - discussed.  Advanced directive discussion - doesn't have this at home. Husband Annie Main would be HCPOA. Doesn't want prolonged life support if terminal condition. Packet provided.  Seat belt use discussed Sunscreen use discussed. No changing moles on skin. 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 Interim medical history since our last visit reviewed. Allergies and medications reviewed and updated. Outpatient Medications Prior to Visit  Medication Sig Dispense Refill  . Adalimumab (HUMIRA PEN) 40 MG/0.4ML PNKT Inject $RemoveBef'40mg'FVKtLQpCgh$  subcutaneously every *OTHER week as directed    . albuterol (PROVENTIL HFA;VENTOLIN HFA) 108 (90 BASE) MCG/ACT inhaler Inhale 2 puffs into the lungs daily as needed for wheezing or shortness of breath.    Marland Kitchen alendronate (FOSAMAX) 70 MG tablet Take 70 mg by mouth once a week.    . betamethasone dipropionate 0.05 % cream Apply topically 2 (two) times daily.    . Biotin 10 MG TABS 1 tablet    . CALCIUM-VITAMIN D PO  Take by mouth daily. Calcium 600 mg + vit D3    . Cholecalciferol (VITAMIN D3 PO) Take 2,000 Units by mouth.    . L-Lysine 500 MG TABS Take by mouth daily.    . Tiotropium Bromide Monohydrate (SPIRIVA RESPIMAT) 2.5 MCG/ACT AERS Inhale 2 puffs into the lungs daily. 4 g 2  . KLOR-CON M20 20 MEQ tablet TAKE 1 TABLET BY MOUTH EVERY DAY 90 tablet 1  . valsartan (DIOVAN) 160 MG tablet Take 1 tablet (160 mg total) by mouth daily. 90 tablet 0  . losartan (COZAAR) 100 MG tablet TAKE 1 TABLET BY MOUTH EVERY DAY (Patient not taking: No sig reported) 90 tablet 1   No facility-administered medications prior to visit.     Per HPI unless specifically indicated in ROS section below Review of Systems  Constitutional: Negative  for activity change, appetite change, chills, fatigue, fever and unexpected weight change.  HENT: Negative for hearing loss.   Eyes: Negative for visual disturbance.  Respiratory: Positive for shortness of breath. Negative for cough, chest tightness and wheezing.   Cardiovascular: Negative for chest pain, palpitations and leg swelling.  Gastrointestinal: Negative for abdominal distention, abdominal pain, blood in stool, constipation, diarrhea, nausea and vomiting.  Genitourinary: Negative for difficulty urinating and hematuria.  Musculoskeletal: Negative for arthralgias, myalgias and neck pain.  Skin: Negative for rash.  Neurological: Positive for dizziness (wobbly) and headaches (R neck). Negative for seizures and syncope.  Hematological: Negative for adenopathy. Does not bruise/bleed easily.  Psychiatric/Behavioral: Negative for dysphoric mood. The patient is not nervous/anxious.    Objective:  BP 118/74   Pulse 86   Temp 98.1 F (36.7 C) (Temporal)   Ht $R'5\' 2"'wQ$  (1.575 m)   Wt 106 lb (48.1 kg)   LMP 12/03/2012   SpO2 100%   BMI 19.39 kg/m   Wt Readings from Last 3 Encounters:  03/29/21 106 lb (48.1 kg)  01/06/21 105 lb (47.6 kg)  12/17/20 106 lb 6.4 oz (48.3 kg)      Physical Exam Vitals and nursing note reviewed.  Constitutional:      General: She is not in acute distress.    Appearance: Normal appearance. She is well-developed. She is not ill-appearing.  HENT:     Head: Normocephalic and atraumatic.     Right Ear: Hearing, tympanic membrane, ear canal and external ear normal.     Left Ear: Hearing, tympanic membrane, ear canal and external ear normal.     Nose: Nose normal.     Mouth/Throat:     Pharynx: Uvula midline. No oropharyngeal exudate or posterior oropharyngeal erythema.  Eyes:     General: No scleral icterus.    Conjunctiva/sclera: Conjunctivae normal.     Pupils: Pupils are equal, round, and reactive to light.  Cardiovascular:     Rate and Rhythm: Normal  rate and regular rhythm.     Pulses:          Radial pulses are 2+ on the right side and 2+ on the left side.     Heart sounds: Normal heart sounds. No murmur heard.   Pulmonary:     Effort: Pulmonary effort is normal. No respiratory distress.     Breath sounds: Normal breath sounds. No wheezing or rales.  Abdominal:     General: Bowel sounds are normal. There is no distension.     Palpations: Abdomen is soft. There is no mass.     Tenderness: There is no abdominal tenderness. There is no guarding or  rebound.  Musculoskeletal:        General: Normal range of motion.     Cervical back: Normal range of motion and neck supple.  Lymphadenopathy:     Cervical: No cervical adenopathy.  Skin:    General: Skin is warm and dry.     Findings: No rash.  Neurological:     Mental Status: She is alert and oriented to person, place, and time.     Comments: CN grossly intact, station and gait intact  Psychiatric:        Behavior: Behavior normal.        Thought Content: Thought content normal.        Judgment: Judgment normal.       Results for orders placed or performed in visit on 03/15/21  VITAMIN D 25 Hydroxy (Vit-D Deficiency, Fractures)  Result Value Ref Range   VITD 86.41 30.00 - 100.00 ng/mL  Hepatitis C antibody  Result Value Ref Range   Hepatitis C Ab NON-REACTIVE NON-REACTI   SIGNAL TO CUT-OFF 0.02 <1.00  CBC with Differential/Platelet  Result Value Ref Range   WBC 7.7 4.0 - 10.5 K/uL   RBC 4.03 3.87 - 5.11 Mil/uL   Hemoglobin 12.8 12.0 - 15.0 g/dL   HCT 37.8 36.0 - 46.0 %   MCV 93.8 78.0 - 100.0 fl   MCHC 33.9 30.0 - 36.0 g/dL   RDW 13.4 11.5 - 15.5 %   Platelets 316.0 150.0 - 400.0 K/uL   Neutrophils Relative % 60.3 43.0 - 77.0 %   Lymphocytes Relative 22.5 12.0 - 46.0 %   Monocytes Relative 16.2 (H) 3.0 - 12.0 %   Eosinophils Relative 0.7 0.0 - 5.0 %   Basophils Relative 0.3 0.0 - 3.0 %   Neutro Abs 4.6 1.4 - 7.7 K/uL   Lymphs Abs 1.7 0.7 - 4.0 K/uL   Monocytes  Absolute 1.2 (H) 0.1 - 1.0 K/uL   Eosinophils Absolute 0.1 0.0 - 0.7 K/uL   Basophils Absolute 0.0 0.0 - 0.1 K/uL  T4, free  Result Value Ref Range   Free T4 1.04 0.60 - 1.60 ng/dL  T3  Result Value Ref Range   T3, Total 79 76 - 181 ng/dL  TSH  Result Value Ref Range   TSH 0.76 0.35 - 4.50 uIU/mL  Comprehensive metabolic panel  Result Value Ref Range   Sodium 130 (L) 135 - 145 mEq/L   Potassium 4.6 3.5 - 5.1 mEq/L   Chloride 93 (L) 96 - 112 mEq/L   CO2 24 19 - 32 mEq/L   Glucose, Bld 103 (H) 70 - 99 mg/dL   BUN 25 (H) 6 - 23 mg/dL   Creatinine, Ser 1.13 0.40 - 1.20 mg/dL   Total Bilirubin 1.2 0.2 - 1.2 mg/dL   Alkaline Phosphatase 75 39 - 117 U/L   AST 41 (H) 0 - 37 U/L   ALT 25 0 - 35 U/L   Total Protein 6.9 6.0 - 8.3 g/dL   Albumin 3.9 3.5 - 5.2 g/dL   GFR 52.25 (L) >60.00 mL/min   Calcium 9.2 8.4 - 10.5 mg/dL  Lipid panel  Result Value Ref Range   Cholesterol 245 (H) 0 - 200 mg/dL   Triglycerides 85.0 0.0 - 149.0 mg/dL   HDL 110.30 >39.00 mg/dL   VLDL 17.0 0.0 - 40.0 mg/dL   LDL Cholesterol 118 (H) 0 - 99 mg/dL   Total CHOL/HDL Ratio 2    NonHDL 134.97    EKG - NSR rate  75, normal axis, poor R wave progression with LAA in LBBB Assessment & Plan:  This visit occurred during the SARS-CoV-2 public health emergency.  Safety protocols were in place, including screening questions prior to the visit, additional usage of staff PPE, and extensive cleaning of exam room while observing appropriate contact time as indicated for disinfecting solutions.   Problem List Items Addressed This Visit    Centrilobular emphysema ([place])    Has established with pulm - appreciate their care.  Now on spiriva.       Hypertension    Chronic. ?Recent overtreatment with recent low readings + bump in creatinine + change from losartan to valsartan due to national shortage. Will drop valsartan dose to 80mg  daily, reassess kidney function in 1 month. Reviewed importance of good hydration however  not going overboard with recent hyponatremia noted.  Will also drop potassium dose from 79mEq to 64mEq as pt has had difficulty swallowing large pills.       Relevant Medications   valsartan (DIOVAN) 80 MG tablet   Other Relevant Orders   EKG 12-Lead (Completed)   Left bundle branch block    Update EKG today.       Relevant Medications   valsartan (DIOVAN) 80 MG tablet   Other Relevant Orders   EKG 12-Lead (Completed)   Rheumatoid arthritis ([place])    Appreciate rheum care.       Smoker    Continue to encourage cessation - contemplative.       Abnormal CT scan of lung    Recent CT reassuring.      Osteoporosis    Continue fosamax - followed by rheum, pending rpt DEXA      Health maintenance examination - Primary    Preventative protocols reviewed and updated unless pt declined. Discussed healthy diet and lifestyle.       Advanced directives, counseling/discussion    Advanced directive discussion - doesn't have this at home. Husband Annie Main would be HCPOA. Doesn't want prolonged life support if terminal condition. Packet provided.       Renal insufficiency    Drop valsartan dose. Rpt labs in 1 month.       Relevant Orders   Renal function panel   Atherosclerosis of aorta [facility])    Thoracic aorta ATH as well as coronary artery calcification. Update EKG. Discussed statin use.       Relevant Medications   valsartan (DIOVAN) 80 MG tablet   Hyperlipidemia    Chronic. Reviewed ASCVD - high HDL partly drives hyperlipidemia. With ATH consider statin. Will defer for now.  The ASCVD Risk score Mikey Bussing DC Jr., et al., 2013) failed to calculate for the following reasons:   The valid HDL cholesterol range is 20 to 100 mg/dL       Relevant Medications   valsartan (DIOVAN) 80 MG tablet    Other Visit Diagnoses    Special screening for malignant neoplasms, colon       Relevant Orders   Fecal occult blood, imunochemical       Meds ordered this encounter  Medications  .  potassium chloride SA (KLOR-CON) 10 MEQ tablet    Sig: Take 1 tablet (10 mEq total) by mouth daily.    Dispense:  90 tablet    Refill:  3    Note new dose  . valsartan (DIOVAN) 80 MG tablet    Sig: Take 1 tablet (80 mg total) by mouth daily.    Dispense:  30 tablet  Refill:  11    Note new dose   Orders Placed This Encounter  Procedures  . Fecal occult blood, imunochemical    Standing Status:   Future    Standing Expiration Date:   03/29/2022  . Renal function panel    Standing Status:   Future    Standing Expiration Date:   03/29/2022  . EKG 12-Lead     Patient instructions: Pass by lab to pick up stool kit. Call to schedule mammogram at your convenience: [place] [phone].  We will request bone density scan and recent labs from Dr Kathlene November Rheumatology.  If interested, check with pharmacy about new 2 shot shingles series (shingrix).  Drop valsartan dose to $Remov'80mg'QiGxWW$  daily (cut current dose in half until you run out, new dose at pharmacy). Drop potassium dose to 10 mEq daily (new dose at pharmacy) Schedule eye doctor appointment. Advanced directive packet provided today  Return as needed or in 1 year for next wellness visit/physical. RTC 6 mo f/u visit  Schedule lab visit only 1 month.   Follow up plan: Return in about 6 months (around 09/29/2021), or if symptoms worsen or fail to improve, for follow up visit.  Ria Bush, MD

## 2021-03-29 NOTE — Assessment & Plan Note (Signed)
Advanced directive discussion - doesn't have this at home. Husband Annie Main would be HCPOA. Doesn't want prolonged life support if terminal condition. Packet provided.

## 2021-03-29 NOTE — Assessment & Plan Note (Signed)
Update EKG today.  

## 2021-03-29 NOTE — Assessment & Plan Note (Signed)
Drop valsartan dose. Rpt labs in 1 month.

## 2021-03-29 NOTE — Assessment & Plan Note (Signed)
Preventative protocols reviewed and updated unless pt declined. Discussed healthy diet and lifestyle.  

## 2021-03-29 NOTE — Assessment & Plan Note (Signed)
Continue to encourage cessation. contemplative 

## 2021-03-29 NOTE — Assessment & Plan Note (Signed)
Recent CT reassuring.

## 2021-04-03 ENCOUNTER — Ambulatory Visit
Admission: RE | Admit: 2021-04-03 | Discharge: 2021-04-03 | Disposition: A | Discharge status: Home / Self Care | Payer: Medicare Other | Source: Ambulatory Visit | Attending: Family Medicine | Admitting: Family Medicine

## 2021-04-03 DIAGNOSIS — Z1231 Encounter for screening mammogram for malignant neoplasm of breast: Secondary | ICD-10-CM

## 2021-04-05 ENCOUNTER — Encounter: Payer: Self-pay | Admitting: Family Medicine

## 2021-04-06 ENCOUNTER — Other Ambulatory Visit (INDEPENDENT_AMBULATORY_CARE_PROVIDER_SITE_OTHER): Payer: Medicare Other

## 2021-04-06 DIAGNOSIS — Z1211 Encounter for screening for malignant neoplasm of colon: Secondary | ICD-10-CM | POA: Diagnosis not present

## 2021-04-06 LAB — FECAL OCCULT BLOOD, GUAIAC: Fecal Occult Blood: NEGATIVE

## 2021-04-06 LAB — FECAL OCCULT BLOOD, IMMUNOCHEMICAL: Fecal Occult Bld: NEGATIVE

## 2021-04-07 ENCOUNTER — Encounter: Payer: Self-pay | Admitting: Family Medicine

## 2021-04-28 DIAGNOSIS — J449 Chronic obstructive pulmonary disease, unspecified: Secondary | ICD-10-CM | POA: Diagnosis not present

## 2021-05-01 ENCOUNTER — Other Ambulatory Visit: Payer: Self-pay | Admitting: Family Medicine

## 2021-05-04 ENCOUNTER — Other Ambulatory Visit: Payer: Self-pay

## 2021-05-04 ENCOUNTER — Other Ambulatory Visit (INDEPENDENT_AMBULATORY_CARE_PROVIDER_SITE_OTHER): Payer: Medicare Other

## 2021-05-04 DIAGNOSIS — N289 Disorder of kidney and ureter, unspecified: Secondary | ICD-10-CM | POA: Diagnosis not present

## 2021-05-04 LAB — RENAL FUNCTION PANEL
Albumin: 4 g/dL (ref 3.5–5.2)
BUN: 7 mg/dL (ref 6–23)
CO2: 23 mEq/L (ref 19–32)
Calcium: 9.1 mg/dL (ref 8.4–10.5)
Chloride: 103 mEq/L (ref 96–112)
Creatinine, Ser: 0.77 mg/dL (ref 0.40–1.20)
GFR: 82.71 mL/min (ref >60.00)
Glucose, Bld: 100 mg/dL — ABNORMAL HIGH (ref 70–99)
Phosphorus: 3.1 mg/dL (ref 2.3–4.6)
Potassium: 3.6 mEq/L (ref 3.5–5.1)
Sodium: 137 mEq/L (ref 135–145)

## 2021-05-12 DIAGNOSIS — H43393 Other vitreous opacities, bilateral: Secondary | ICD-10-CM | POA: Diagnosis not present

## 2021-05-20 ENCOUNTER — Telehealth: Payer: Self-pay

## 2021-05-20 NOTE — Telephone Encounter (Signed)
Pt called in stating she thought she may have a UTI and wanted to drop off a urine or get an antibiotic called in. I advised her we do not have any availability today for OVs and the other offices did not. I advised we could not just drop off the urine. I gave her info on the [facility] on [address] and the [employer] location.

## 2021-05-21 ENCOUNTER — Ambulatory Visit
Admission: EM | Admit: 2021-05-21 | Discharge: 2021-05-21 | Disposition: A | Discharge status: Home / Self Care | Payer: Medicare Other | Attending: Emergency Medicine | Admitting: Emergency Medicine

## 2021-05-21 ENCOUNTER — Other Ambulatory Visit: Payer: Self-pay

## 2021-05-21 ENCOUNTER — Encounter: Payer: Self-pay | Admitting: Emergency Medicine

## 2021-05-21 DIAGNOSIS — N3000 Acute cystitis without hematuria: Secondary | ICD-10-CM

## 2021-05-21 DIAGNOSIS — R350 Frequency of micturition: Secondary | ICD-10-CM | POA: Diagnosis not present

## 2021-05-21 DIAGNOSIS — R300 Dysuria: Secondary | ICD-10-CM

## 2021-05-21 LAB — POCT URINALYSIS DIP (MANUAL ENTRY)
Bilirubin, UA: NEGATIVE
Blood, UA: NEGATIVE
Glucose, UA: NEGATIVE mg/dL
Ketones, POC UA: NEGATIVE mg/dL
Leukocytes, UA: NEGATIVE
Nitrite, UA: NEGATIVE
Protein Ur, POC: NEGATIVE mg/dL
Spec Grav, UA: 1.01 (ref 1.010–1.025)
Urobilinogen, UA: 0.2 E.U./dL
pH, UA: 5.5 (ref 5.0–8.0)

## 2021-05-21 MED ORDER — NITROFURANTOIN MONOHYD MACRO 100 MG PO CAPS: 100.0000 mg | ORAL_CAPSULE | Freq: Two times a day (BID) | ORAL | 0 refills | Status: AC

## 2021-05-21 NOTE — ED Triage Notes (Signed)
Patient c/o dysuria x 1 day.   Patient endorses "burning with urination".   Patient denies ABD pain, back pain, hematuria, and foul smell.   Patient hasn't used any medication for symptoms.

## 2021-05-21 NOTE — Discharge Instructions (Addendum)
Please increase water intake.   Will send urine culture and will call with results. Follow up if no resolution of symptoms with antibiotic.

## 2021-05-21 NOTE — ED Provider Notes (Addendum)
[facility]    CSN: [id number] Arrival date & time: 05/21/21  1005      History   Chief Complaint Chief Complaint  Patient presents with   Dysuria    HPI Kim Braun is a 60 y.o. female.   Patient presents with urinary burning and frequency that started yesterday. Denies fever, lower back pain, or abdominal pain. Denies vaginal discharge, irritation, or itching. Denies STD exposure. States previous UTI's have felt similar.    Dysuria Associated symptoms: no flank pain and no vaginal discharge    Past Medical History:  Diagnosis Date   COPD (chronic obstructive pulmonary disease) ([place]) 01/2019   History of cardiac murmur as a child    child   History of gestational diabetes    Hypertension    Left bundle branch block    11/2012 low risk, no ischemia, NL EF stress test (Dr. Marlou Porch)   Rheumatoid arthritis(714.0)    Smoker    Thyroid nodule     Patient Active Problem List   Diagnosis Date Noted   Health maintenance examination 03/29/2021   Advanced directives, counseling/discussion 03/29/2021   Renal insufficiency 03/29/2021   Atherosclerosis of aorta ([place]) 03/29/2021   Hyperlipidemia 03/29/2021   Osteoporosis 03/13/2021   Thyroid nodule 11/18/2020   Centrilobular emphysema ([place]) 11/18/2020   Abnormal CT scan of lung 11/18/2020   Hypertension    Left bundle branch block    Rheumatoid arthritis ([place])    Smoker    Congestive dilated cardiomyopathy ([place]) 09/28/2014    Past Surgical History:  Procedure Laterality Date   ABDOMINAL HYSTERECTOMY  12/19/2012   Procedure: HYSTERECTOMY ABDOMINAL;  Surgeon: Maeola Sarah. Landry Mellow, MD;  Location: [facility];  Service: Gynecology;  Laterality: N/A;  Incision @ 1517   ANTERIOR CERVICAL DECOMP/DISCECTOMY FUSION N/A 07/17/2013   Cervical four-five, Cervical five-six Anterior cervical decompression/diskectomy/fusion;  Surgeon: Floyce Stakes, MD;  Location: [facility];  Service: Neurosurgery;  Laterality: N/A;  Cervical  four-five, Cervical five-six Anterior cervical decompression/diskectomy/fusion   BILATERAL SALPINGECTOMY  12/19/2012   Procedure: BILATERAL SALPINGECTOMY;  Surgeon: Maeola Sarah. Landry Mellow, MD;  Location: [facility];  Service: Gynecology;  Laterality: Bilateral;   CARDIAC CATHETERIZATION  09/30/2014   Procedure: LEFT HEART CATH AND CORONARY ANGIOGRAPHY;  Surgeon: Laverda Page, MD;  Location: [facility] LAB;  Service: Cardiovascular;;   CESAREAN SECTION     COLON SURGERY  1992   CYSTOSCOPY  12/19/2012   Procedure: CYSTOSCOPY;  Surgeon: Maeola Sarah. Landry Mellow, MD;  Location: [facility];  Service: Gynecology;  Laterality: N/A;   ROBOTIC ASSISTED TOTAL HYSTERECTOMY  12/19/2012   total hysterectomy for fibroids, ovaries removed [name])    OB History   No obstetric history on file.      Home Medications    Prior to Admission medications   Medication Sig Start Date End Date Taking? Authorizing Provider  Adalimumab (HUMIRA PEN) 40 MG/0.4ML PNKT Inject 40mg  subcutaneously every *OTHER week as directed 02/05/20  Yes [provider]  alendronate (FOSAMAX) 70 MG tablet Take 70 mg by mouth once a week. 11/12/20  Yes [provider]  Biotin 10 MG TABS 1 tablet   Yes [provider]  CALCIUM-VITAMIN D PO Take by mouth daily. Calcium 600 mg + vit D3   Yes [provider]  Cholecalciferol (VITAMIN D3 PO) Take 2,000 Units by mouth.   Yes [provider]  L-Lysine 500 MG TABS Take by mouth daily.   Yes [provider]  nitrofurantoin, macrocrystal-monohydrate, (MACROBID) 100 MG capsule Take 1 capsule (100 mg total) by mouth 2 (two) times daily for 7 days. 05/21/21 05/28/21 Yes Odis Luster, FNP  potassium chloride SA (KLOR-CON) 10 MEQ tablet Take 1 tablet (10 mEq total) by mouth daily. 03/29/21  Yes Ria Bush, MD  valsartan (DIOVAN) 80 MG tablet Take 1 tablet (80 mg total) by mouth daily. 03/29/21  Yes Ria Bush, MD  albuterol (PROVENTIL HFA;VENTOLIN HFA) 108 (90  BASE) MCG/ACT inhaler Inhale 2 puffs into the lungs daily as needed for wheezing or shortness of breath.    [provider]  betamethasone dipropionate 0.05 % cream Apply topically 2 (two) times daily.    [provider]  Tiotropium Bromide Monohydrate (SPIRIVA RESPIMAT) 2.5 MCG/ACT AERS Inhale 2 puffs into the lungs daily. 12/28/20   Flora Lipps, MD    Family History Family History  Problem Relation Age of Onset   Stroke Mother    CAD Mother        MI   Diabetes Sister    Hypertension Brother    CAD Brother        MI   Hypertension Brother    Hypertension Sister    Breast cancer Sister    Cancer Neg Hx     Social History Social History   Tobacco Use   Smoking status: Every Day    Packs/day: 1.00    Years: 44.00    Pack years: 44.00    Types: Cigarettes   Smokeless tobacco: Never   Tobacco comments:    smoking 1/2 ppd.  very stressed.  Substance Use Topics   Alcohol use: Yes    Alcohol/week: 14.0 - 24.0 standard drinks    Types: 14 - 24 Cans of beer per week   Drug use: No     Allergies   Patient has no known allergies.   Review of Systems Review of Systems  Genitourinary:  Positive for dysuria, frequency and urgency. Negative for difficulty urinating, flank pain, genital sores, pelvic pain, vaginal bleeding, vaginal discharge and vaginal pain.    Physical Exam Triage Vital Signs ED Triage Vitals  Enc Vitals Group     BP 05/21/21 1102 (!) 145/101     Pulse Rate 05/21/21 1102 100     Resp 05/21/21 1102 15     Temp 05/21/21 1102 98.3 F (36.8 C)     Temp src --      SpO2 05/21/21 1102 97 %     Weight --      Height --      Head Circumference --      Peak Flow --      Pain Score 05/21/21 1059 0     Pain Loc --      Pain Edu? --      Excl. in [place]? --    No data found.  Updated Vital Signs BP (!) 145/101 (BP Location: Left Arm)   Pulse 100   Temp 98.3 F (36.8 C)   Resp 15   LMP 12/03/2012   SpO2 97%   Visual Acuity Right  Eye Distance:   Left Eye Distance:   Bilateral Distance:    Right Eye Near:   Left Eye Near:    Bilateral Near:     Physical Exam Constitutional:      Appearance: Normal appearance.  HENT:     Head: Normocephalic and atraumatic.  Eyes:     Extraocular Movements: Extraocular movements intact.  Conjunctiva/sclera: Conjunctivae normal.  Pulmonary:     Effort: Pulmonary effort is normal.  Abdominal:     General: Abdomen is flat. Bowel sounds are normal. There is no distension.     Palpations: Abdomen is soft.     Tenderness: There is no abdominal tenderness.  Skin:    General: Skin is warm and dry.  Neurological:     General: No focal deficit present.     Mental Status: She is alert and oriented to person, place, and time. Mental status is at baseline.  Psychiatric:        Mood and Affect: Mood normal.        Behavior: Behavior normal.        Thought Content: Thought content normal.        Judgment: Judgment normal.     UC Treatments / Results  Labs (all labs ordered are listed, but only abnormal results are displayed) Labs Reviewed  URINE CULTURE  POCT URINALYSIS DIP (MANUAL ENTRY)    EKG   Radiology No results found.  Procedures Procedures (including critical care time)  Medications Ordered in UC Medications - No data to display  Initial Impression / Assessment and Plan / UC Course  I have reviewed the triage vital signs and the nursing notes.  Pertinent labs & imaging results that were available during my care of the patient were reviewed by me and considered in my medical decision making (see chart for details).     Will treat for UTI as symptoms are consistent with diagnosis and physical exam. Urine culture pending. Will call if urine culture warrants change in treatment. Patient advised to increase water intake and to follow up with PCP or urgent care as needed if no resolution of symptoms.  Final Clinical Impressions(s) / UC Diagnoses   Final  diagnoses:  Acute cystitis without hematuria  Dysuria  Urinary frequency     Discharge Instructions      Please increase water intake.   Will send urine culture and will call with results. Follow up if no resolution of symptoms with antibiotic.    ED Prescriptions     Medication Sig Dispense Auth. Provider   nitrofurantoin, macrocrystal-monohydrate, (MACROBID) 100 MG capsule Take 1 capsule (100 mg total) by mouth 2 (two) times daily for 7 days. 14 capsule Odis Luster, FNP      PDMP not reviewed this encounter.   Odis Luster, FNP 05/21/21 [place], [place], FNP 05/21/21 1141

## 2021-05-22 LAB — URINE CULTURE: Culture: NO GROWTH

## 2021-05-28 DIAGNOSIS — J449 Chronic obstructive pulmonary disease, unspecified: Secondary | ICD-10-CM | POA: Diagnosis not present

## 2021-06-28 DIAGNOSIS — J449 Chronic obstructive pulmonary disease, unspecified: Secondary | ICD-10-CM | POA: Diagnosis not present

## 2021-07-21 DIAGNOSIS — L668 Other cicatricial alopecia: Secondary | ICD-10-CM | POA: Diagnosis not present

## 2021-07-21 DIAGNOSIS — L218 Other seborrheic dermatitis: Secondary | ICD-10-CM | POA: Diagnosis not present

## 2021-07-23 ENCOUNTER — Telehealth (INDEPENDENT_AMBULATORY_CARE_PROVIDER_SITE_OTHER): Payer: Medicare Other | Admitting: Family Medicine

## 2021-07-23 ENCOUNTER — Encounter: Payer: Self-pay | Admitting: Family Medicine

## 2021-07-23 ENCOUNTER — Other Ambulatory Visit: Payer: Self-pay

## 2021-07-23 VITALS — BP 103/80 | HR 97 | Temp 101.1°F | Ht 62.0 in | Wt 105.0 lb

## 2021-07-23 DIAGNOSIS — U071 COVID-19: Secondary | ICD-10-CM

## 2021-07-23 DIAGNOSIS — F17200 Nicotine dependence, unspecified, uncomplicated: Secondary | ICD-10-CM | POA: Diagnosis not present

## 2021-07-23 DIAGNOSIS — I10 Essential (primary) hypertension: Secondary | ICD-10-CM | POA: Diagnosis not present

## 2021-07-23 DIAGNOSIS — J432 Centrilobular emphysema: Secondary | ICD-10-CM

## 2021-07-23 DIAGNOSIS — I420 Dilated cardiomyopathy: Secondary | ICD-10-CM | POA: Diagnosis not present

## 2021-07-23 DIAGNOSIS — M069 Rheumatoid arthritis, unspecified: Secondary | ICD-10-CM

## 2021-07-23 HISTORY — DX: COVID-19: U07.1

## 2021-07-23 MED ORDER — NIRMATRELVIR/RITONAVIR (PAXLOVID)TABLET: 3.0000 | ORAL_TABLET | Freq: Two times a day (BID) | ORAL | 0 refills | Status: AC

## 2021-07-23 NOTE — Assessment & Plan Note (Addendum)
Reviewed currently approved EUA treatments.  Reviewed expected course of illness, anticipated course of recovery, as well as red flags to suggest COVID pneumonia and/or to seek urgent in-person care. Reviewed latest CDC isolation/quarantine guidelines.  Encouraged fluids and rest. Reviewed further supportive care measures at home including vit C '500mg'$  bid, vit D 2000 IU daily, zinc '100mg'$  daily, tylenol PRN.  Recommend:  Full dose paxlovid (GFR 77) Paxlovid drug interactions:  1. Betamethasone cream - limit use while on paxlovid

## 2021-07-23 NOTE — Progress Notes (Signed)
Patient ID: Kim Braun, female    DOB: 1961/09/01, 60 y.o.   MRN: [id number]  Virtual visit completed through [place], a video enabled telemedicine application. Due to national recommendations of social distancing due to COVID-19, a virtual visit is felt to be most appropriate for this patient at this time. Reviewed limitations, risks, security and privacy concerns of performing a virtual visit and the availability of in person appointments. I also reviewed that there may be a patient responsible charge related to this service. The patient agreed to proceed.   Patient location: home Provider location: [place] [facility], office Persons participating in this virtual visit: patient, provider   If any vitals were documented, they were collected by patient at home unless specified below.    BP 103/80   Pulse 97   Temp (!) 101.1 F (38.4 C) (Oral)   Ht '5\' 2"'$  (1.575 m)   Wt 105 lb (47.6 kg)   LMP 12/03/2012   BMI 19.20 kg/m    CC: COVID positive Subjective:   HPI: Kim Braun is a 60 y.o. female presenting on 07/23/2021 for Covid Positive, Headache, Muscle Pain (fever), Sore Throat, Cough, and Fever   First day of symptoms: 07/22/2021 Tested COVID positive: 07/21/2021  Current symptoms: fatigue, chills, body aches, headache, sore throat, cough, and fever, congestion. Coughing up clear mucous. Increased wheezing. Loss of appetite. Notes exertional dyspnea but not severe. Some diarrhea. No: loss of taste, smell, abd pain, nausea.  Treatments to date: tylenol, drinking fluids. Continues her spiriva.  Risk factors include: COPD, HTN, Rheumatoid arthritis on Humira (latest dose 1 wk ago), smoker, age  Husband tested positive for COVID earlier this week.   COVID vaccination status: Pfizer x2     Relevant past medical, surgical, family and social history reviewed and updated as indicated. Interim medical history since our last visit reviewed. Allergies and medications  reviewed and updated. Outpatient Medications Prior to Visit  Medication Sig Dispense Refill   Adalimumab (HUMIRA PEN) 40 MG/0.4ML PNKT Inject '40mg'$  subcutaneously every *OTHER week as directed     albuterol (PROVENTIL HFA;VENTOLIN HFA) 108 (90 BASE) MCG/ACT inhaler Inhale 2 puffs into the lungs daily as needed for wheezing or shortness of breath.     alendronate (FOSAMAX) 70 MG tablet Take 70 mg by mouth once a week.     betamethasone dipropionate 0.05 % cream Apply topically 2 (two) times daily.     Biotin 10 MG TABS 1 tablet     CALCIUM-VITAMIN D PO Take by mouth daily. Calcium 600 mg + vit D3     Cholecalciferol (VITAMIN D3 PO) Take 2,000 Units by mouth.     L-Lysine 500 MG TABS Take by mouth daily.     potassium chloride SA (KLOR-CON) 10 MEQ tablet Take 1 tablet (10 mEq total) by mouth daily. 90 tablet 3   Tiotropium Bromide Monohydrate (SPIRIVA RESPIMAT) 2.5 MCG/ACT AERS Inhale 2 puffs into the lungs daily. 4 g 2   valsartan (DIOVAN) 80 MG tablet Take 1 tablet (80 mg total) by mouth daily. 30 tablet 11   No facility-administered medications prior to visit.     Per HPI unless specifically indicated in ROS section below Review of Systems Objective:  BP 103/80   Pulse 97   Temp (!) 101.1 F (38.4 C) (Oral)   Ht '5\' 2"'$  (1.575 m)   Wt 105 lb (47.6 kg)   LMP 12/03/2012   BMI 19.20 kg/m   Wt Readings from Last  3 Encounters:  07/23/21 105 lb (47.6 kg)  03/29/21 106 lb (48.1 kg)  01/06/21 105 lb (47.6 kg)       Physical exam: Gen: alert, NAD, not ill appearing Pulm: speaks in complete sentences without increased work of breathing Psych: normal mood, normal thought content      Lab Results  Component Value Date   CREATININE 0.77 05/04/2021   BUN 7 05/04/2021   NA 137 05/04/2021   K 3.6 05/04/2021   CL 103 05/04/2021   CO2 23 05/04/2021   GFR 77 Assessment & Plan:   Problem List Items Addressed This Visit     Congestive dilated cardiomyopathy ([place]) - Primary    Centrilobular emphysema (H[facility])   Hypertension   Rheumatoid arthritis ([place])   Smoker   COVID-19 virus infection    Reviewed currently approved EUA treatments.  Reviewed expected course of illness, anticipated course of recovery, as well as red flags to suggest COVID pneumonia and/or to seek urgent in-person care. Reviewed latest CDC isolation/quarantine guidelines.  Encouraged fluids and rest. Reviewed further supportive care measures at home including vit C '500mg'$  bid, vit D 2000 IU daily, zinc '100mg'$  daily, tylenol PRN.  Recommend:  Full dose paxlovid (GFR 77) Paxlovid drug interactions:  1. Betamethasone cream - limit use while on paxlovid      Relevant Medications   nirmatrelvir/ritonavir EUA (PAXLOVID) 20 x 150 MG & 10 x '100MG'$  TABS     Meds ordered this encounter  Medications   nirmatrelvir/ritonavir EUA (PAXLOVID) 20 x 150 MG & 10 x '100MG'$  TABS    Sig: Take 3 tablets by mouth 2 (two) times daily for 5 days. (Take nirmatrelvir 150 mg two tablets twice daily for 5 days and ritonavir 100 mg one tablet twice daily for 5 days) Patient GFR is 77    Dispense:  30 tablet    Refill:  0   No orders of the defined types were placed in this encounter.   I discussed the assessment and treatment plan with the patient. The patient was provided an opportunity to ask questions and all were answered. The patient agreed with the plan and demonstrated an understanding of the instructions. The patient was advised to call back or seek an in-person evaluation if the symptoms worsen or if the condition fails to improve as anticipated.  Follow up plan: No follow-ups on file.  Ria Bush, MD

## 2021-07-29 DIAGNOSIS — J449 Chronic obstructive pulmonary disease, unspecified: Secondary | ICD-10-CM | POA: Diagnosis not present

## 2021-08-18 DIAGNOSIS — Z79899 Other long term (current) drug therapy: Secondary | ICD-10-CM | POA: Diagnosis not present

## 2021-08-18 DIAGNOSIS — M810 Age-related osteoporosis without current pathological fracture: Secondary | ICD-10-CM | POA: Diagnosis not present

## 2021-08-18 DIAGNOSIS — Z23 Encounter for immunization: Secondary | ICD-10-CM | POA: Diagnosis not present

## 2021-08-18 DIAGNOSIS — M79643 Pain in unspecified hand: Secondary | ICD-10-CM | POA: Diagnosis not present

## 2021-08-18 DIAGNOSIS — M1990 Unspecified osteoarthritis, unspecified site: Secondary | ICD-10-CM | POA: Diagnosis not present

## 2021-08-18 DIAGNOSIS — M0579 Rheumatoid arthritis with rheumatoid factor of multiple sites without organ or systems involvement: Secondary | ICD-10-CM | POA: Diagnosis not present

## 2021-08-18 DIAGNOSIS — M797 Fibromyalgia: Secondary | ICD-10-CM | POA: Diagnosis not present

## 2021-08-23 DIAGNOSIS — L218 Other seborrheic dermatitis: Secondary | ICD-10-CM | POA: Diagnosis not present

## 2021-08-23 DIAGNOSIS — Z85828 Personal history of other malignant neoplasm of skin: Secondary | ICD-10-CM | POA: Diagnosis not present

## 2021-08-23 DIAGNOSIS — L570 Actinic keratosis: Secondary | ICD-10-CM | POA: Diagnosis not present

## 2021-08-23 DIAGNOSIS — L568 Other specified acute skin changes due to ultraviolet radiation: Secondary | ICD-10-CM | POA: Diagnosis not present

## 2021-08-23 DIAGNOSIS — Z08 Encounter for follow-up examination after completed treatment for malignant neoplasm: Secondary | ICD-10-CM | POA: Diagnosis not present

## 2021-08-23 DIAGNOSIS — L821 Other seborrheic keratosis: Secondary | ICD-10-CM | POA: Diagnosis not present

## 2021-08-28 DIAGNOSIS — J449 Chronic obstructive pulmonary disease, unspecified: Secondary | ICD-10-CM | POA: Diagnosis not present

## 2021-09-20 DIAGNOSIS — B078 Other viral warts: Secondary | ICD-10-CM | POA: Diagnosis not present

## 2021-09-20 DIAGNOSIS — L218 Other seborrheic dermatitis: Secondary | ICD-10-CM | POA: Diagnosis not present

## 2021-09-20 DIAGNOSIS — L570 Actinic keratosis: Secondary | ICD-10-CM | POA: Diagnosis not present

## 2021-09-28 DIAGNOSIS — J449 Chronic obstructive pulmonary disease, unspecified: Secondary | ICD-10-CM | POA: Diagnosis not present

## 2021-10-13 DIAGNOSIS — T8090XA Unspecified complication following infusion and therapeutic injection, initial encounter: Secondary | ICD-10-CM | POA: Diagnosis not present

## 2021-10-13 DIAGNOSIS — Z79899 Other long term (current) drug therapy: Secondary | ICD-10-CM | POA: Diagnosis not present

## 2021-10-13 DIAGNOSIS — M79643 Pain in unspecified hand: Secondary | ICD-10-CM | POA: Diagnosis not present

## 2021-10-13 DIAGNOSIS — M1990 Unspecified osteoarthritis, unspecified site: Secondary | ICD-10-CM | POA: Diagnosis not present

## 2021-10-13 DIAGNOSIS — M0579 Rheumatoid arthritis with rheumatoid factor of multiple sites without organ or systems involvement: Secondary | ICD-10-CM | POA: Diagnosis not present

## 2021-10-13 DIAGNOSIS — M810 Age-related osteoporosis without current pathological fracture: Secondary | ICD-10-CM | POA: Diagnosis not present

## 2021-10-13 DIAGNOSIS — M797 Fibromyalgia: Secondary | ICD-10-CM | POA: Diagnosis not present

## 2021-10-28 DIAGNOSIS — J449 Chronic obstructive pulmonary disease, unspecified: Secondary | ICD-10-CM | POA: Diagnosis not present

## 2021-11-25 ENCOUNTER — Emergency Department (HOSPITAL_COMMUNITY): Payer: Medicare Other

## 2021-11-25 ENCOUNTER — Emergency Department (HOSPITAL_COMMUNITY)
Admission: EM | Admit: 2021-11-25 | Discharge: 2021-11-25 | Disposition: A | Discharge status: Home / Self Care | Payer: Medicare Other | Attending: Emergency Medicine | Admitting: Emergency Medicine

## 2021-11-25 ENCOUNTER — Encounter (HOSPITAL_COMMUNITY): Payer: Self-pay | Admitting: Emergency Medicine

## 2021-11-25 ENCOUNTER — Other Ambulatory Visit: Payer: Self-pay

## 2021-11-25 DIAGNOSIS — Z8616 Personal history of COVID-19: Secondary | ICD-10-CM | POA: Diagnosis not present

## 2021-11-25 DIAGNOSIS — R0789 Other chest pain: Secondary | ICD-10-CM | POA: Diagnosis not present

## 2021-11-25 DIAGNOSIS — Z79899 Other long term (current) drug therapy: Secondary | ICD-10-CM | POA: Diagnosis not present

## 2021-11-25 DIAGNOSIS — R9389 Abnormal findings on diagnostic imaging of other specified body structures: Secondary | ICD-10-CM

## 2021-11-25 DIAGNOSIS — J449 Chronic obstructive pulmonary disease, unspecified: Secondary | ICD-10-CM | POA: Insufficient documentation

## 2021-11-25 DIAGNOSIS — R935 Abnormal findings on diagnostic imaging of other abdominal regions, including retroperitoneum: Secondary | ICD-10-CM | POA: Insufficient documentation

## 2021-11-25 DIAGNOSIS — J189 Pneumonia, unspecified organism: Secondary | ICD-10-CM | POA: Insufficient documentation

## 2021-11-25 DIAGNOSIS — F17210 Nicotine dependence, cigarettes, uncomplicated: Secondary | ICD-10-CM | POA: Diagnosis not present

## 2021-11-25 DIAGNOSIS — R079 Chest pain, unspecified: Secondary | ICD-10-CM

## 2021-11-25 DIAGNOSIS — Z720 Tobacco use: Secondary | ICD-10-CM

## 2021-11-25 DIAGNOSIS — I159 Secondary hypertension, unspecified: Secondary | ICD-10-CM | POA: Diagnosis not present

## 2021-11-25 DIAGNOSIS — R059 Cough, unspecified: Secondary | ICD-10-CM | POA: Diagnosis not present

## 2021-11-25 DIAGNOSIS — I10 Essential (primary) hypertension: Secondary | ICD-10-CM | POA: Diagnosis not present

## 2021-11-25 DIAGNOSIS — R0602 Shortness of breath: Secondary | ICD-10-CM | POA: Diagnosis not present

## 2021-11-25 DIAGNOSIS — J439 Emphysema, unspecified: Secondary | ICD-10-CM | POA: Diagnosis not present

## 2021-11-25 LAB — BASIC METABOLIC PANEL
Anion gap: 11 (ref 5–15)
BUN: 7 mg/dL — ABNORMAL LOW (ref 8–23)
CO2: 24 mmol/L (ref 22–32)
Calcium: 8.9 mg/dL (ref 8.9–10.3)
Chloride: 101 mmol/L (ref 98–111)
Creatinine, Ser: 0.7 mg/dL (ref 0.44–1.00)
GFR, Estimated: >60 mL/min (ref >60)
Glucose, Bld: 109 mg/dL — ABNORMAL HIGH (ref 70–99)
Potassium: 3.3 mmol/L — ABNORMAL LOW (ref 3.5–5.1)
Sodium: 136 mmol/L (ref 135–145)

## 2021-11-25 LAB — TROPONIN I (HIGH SENSITIVITY)
Troponin I (High Sensitivity): 7 ng/L (ref <18)
Troponin I (High Sensitivity): 10 ng/L (ref <18)

## 2021-11-25 LAB — CBC
HCT: 45.9 % (ref 36.0–46.0)
Hemoglobin: 15.5 g/dL — ABNORMAL HIGH (ref 12.0–15.0)
MCH: 32.8 pg (ref 26.0–34.0)
MCHC: 33.8 g/dL (ref 30.0–36.0)
MCV: 97 fL (ref 80.0–100.0)
Platelets: 363 10*3/uL (ref 150–400)
RBC: 4.73 MIL/uL (ref 3.87–5.11)
RDW: 14.6 % (ref 11.5–15.5)
WBC: 7.9 10*3/uL (ref 4.0–10.5)
nRBC: 0 % (ref 0.0–0.2)

## 2021-11-25 MED ORDER — DOXYCYCLINE HYCLATE 100 MG PO CAPS: 100.0000 mg | ORAL_CAPSULE | Freq: Two times a day (BID) | ORAL | 0 refills | Status: DC

## 2021-11-25 MED ORDER — IOHEXOL 350 MG/ML SOLN
55.0000 mL | Freq: Once | INTRAVENOUS | Status: AC | PRN
  Administered 2021-11-25: 10:00:00 55 mL via INTRAVENOUS

## 2021-11-25 MED ORDER — POTASSIUM CHLORIDE CRYS ER 20 MEQ PO TBCR
20.0000 meq | EXTENDED_RELEASE_TABLET | Freq: Once | ORAL | Status: AC
  Administered 2021-11-25: 09:00:00 20 meq via ORAL
  Filled 2021-11-25: qty 1

## 2021-11-25 NOTE — ED Notes (Signed)
Patient transported to CT 

## 2021-11-25 NOTE — ED Triage Notes (Signed)
Patient reports persistent left chest pain radiating to left back with SOB onset last week , pain increases with deep inspiration, occasional dry cough . No emesis or diaphoresis .

## 2021-11-25 NOTE — ED Provider Notes (Signed)
[facility] EMERGENCY DEPARTMENT Provider Note   CSN: [id number] Arrival date & time: 11/25/21  9476     History Chief Complaint  Patient presents with   Chest Pain    Kim Braun is a 60 y.o. female.  Patient is a 60 yo female with pmh of DM, HTN compliant on losartan, and tobacco use presenting for chest pain. Chest pain is located under right breast with radiation to right shoulder blade x 1 week. Intermittent in nature. Occurs at rest worse with breathing. Denies hx of previous MI. Denies hx of PE. Denies infection symptoms including no fevers or chills. Admits to cough but states she always has cough from COPD. H  The history is provided by the patient. No language interpreter was used.  Chest Pain Associated symptoms: no abdominal pain, no back pain, no cough, no fever, no palpitations, no shortness of breath and no vomiting       Past Medical History:  Diagnosis Date   COPD (chronic obstructive pulmonary disease) ([place]) 01/2019   History of cardiac murmur as a child    child   History of gestational diabetes    Hypertension    Left bundle branch block    11/2012 low risk, no ischemia, NL EF stress test (Dr. Marlou Porch)   Rheumatoid arthritis(714.0)    Smoker    Thyroid nodule     Patient Active Problem List   Diagnosis Date Noted   COVID-19 virus infection 07/23/2021   Health maintenance examination 03/29/2021   Advanced directives, counseling/discussion 03/29/2021   Renal insufficiency 03/29/2021   Atherosclerosis of aorta ([place]) 03/29/2021   Hyperlipidemia 03/29/2021   Osteoporosis 03/13/2021   Thyroid nodule 11/18/2020   Centrilobular emphysema ([place]) 11/18/2020   Abnormal CT scan of lung 11/18/2020   Hypertension    Left bundle branch block    Rheumatoid arthritis ([place])    Smoker    Congestive dilated cardiomyopathy ([place]) 09/28/2014    Past Surgical History:  Procedure Laterality Date   ABDOMINAL HYSTERECTOMY  12/19/2012    Procedure: HYSTERECTOMY ABDOMINAL;  Surgeon: Maeola Sarah. Landry Mellow, MD;  Location: [facility];  Service: Gynecology;  Laterality: N/A;  Incision @ 1517   ANTERIOR CERVICAL DECOMP/DISCECTOMY FUSION N/A 07/17/2013   Cervical four-five, Cervical five-six Anterior cervical decompression/diskectomy/fusion;  Surgeon: Floyce Stakes, MD;  Location: [facility];  Service: Neurosurgery;  Laterality: N/A;  Cervical four-five, Cervical five-six Anterior cervical decompression/diskectomy/fusion   BILATERAL SALPINGECTOMY  12/19/2012   Procedure: BILATERAL SALPINGECTOMY;  Surgeon: Maeola Sarah. Landry Mellow, MD;  Location: [facility];  Service: Gynecology;  Laterality: Bilateral;   CARDIAC CATHETERIZATION  09/30/2014   Procedure: LEFT HEART CATH AND CORONARY ANGIOGRAPHY;  Surgeon: Laverda Page, MD;  Location: [facility] CATH LAB;  Service: Cardiovascular;;   CESAREAN SECTION     COLON SURGERY  1992   CYSTOSCOPY  12/19/2012   Procedure: CYSTOSCOPY;  Surgeon: Maeola Sarah. Landry Mellow, MD;  Location: [facility];  Service: Gynecology;  Laterality: N/A;   ROBOTIC ASSISTED TOTAL HYSTERECTOMY  12/19/2012   total hysterectomy for fibroids, ovaries removed [name])     OB History   No obstetric history on file.     Family History  Problem Relation Age of Onset   Stroke Mother    CAD Mother        MI   Diabetes Sister    Hypertension Brother    CAD Brother        MI   Hypertension Brother  Hypertension Sister    Breast cancer Sister    Cancer Neg Hx     Social History   Tobacco Use   Smoking status: Every Day    Packs/day: 1.00    Years: 44.00    Pack years: 44.00    Types: Cigarettes   Smokeless tobacco: Never   Tobacco comments:    smoking 1/2 ppd.  very stressed.  Substance Use Topics   Alcohol use: Yes    Alcohol/week: 14.0 - 24.0 standard drinks    Types: 14 - 24 Cans of beer per week   Drug use: No    Home Medications Prior to Admission medications   Medication Sig Start Date End Date Taking? Authorizing Provider   doxycycline (VIBRAMYCIN) 100 MG capsule Take 1 capsule (100 mg total) by mouth 2 (two) times daily. 24/58/09  Yes Campbell Stall P, DO  Adalimumab (HUMIRA PEN) 40 MG/0.4ML PNKT Inject 40mg  subcutaneously every *OTHER week as directed 02/05/20   [provider]  albuterol (PROVENTIL HFA;VENTOLIN HFA) 108 (90 BASE) MCG/ACT inhaler Inhale 2 puffs into the lungs daily as needed for wheezing or shortness of breath.    [provider]  alendronate (FOSAMAX) 70 MG tablet Take 70 mg by mouth once a week. 11/12/20   [provider]  betamethasone dipropionate 0.05 % cream Apply topically 2 (two) times daily.    [provider]  Biotin 10 MG TABS 1 tablet    [provider]  CALCIUM-VITAMIN D PO Take by mouth daily. Calcium 600 mg + vit D3    [provider]  Cholecalciferol (VITAMIN D3 PO) Take 2,000 Units by mouth.    [provider]  L-Lysine 500 MG TABS Take by mouth daily.    [provider]  potassium chloride SA (KLOR-CON) 10 MEQ tablet Take 1 tablet (10 mEq total) by mouth daily. 03/29/21   Ria Bush, MD  Tiotropium Bromide Monohydrate (SPIRIVA RESPIMAT) 2.5 MCG/ACT AERS Inhale 2 puffs into the lungs daily. 12/28/20   Flora Lipps, MD  valsartan (DIOVAN) 80 MG tablet Take 1 tablet (80 mg total) by mouth daily. 03/29/21   Ria Bush, MD    Allergies    Patient has no known allergies.  Review of Systems   Review of Systems  Constitutional:  Negative for chills and fever.  HENT:  Negative for ear pain and sore throat.   Eyes:  Negative for pain and visual disturbance.  Respiratory:  Negative for cough and shortness of breath.   Cardiovascular:  Positive for chest pain. Negative for palpitations.  Gastrointestinal:  Negative for abdominal pain and vomiting.  Genitourinary:  Negative for dysuria and hematuria.  Musculoskeletal:  Negative for arthralgias and back pain.  Skin:  Negative for color change and rash.   Neurological:  Negative for seizures and syncope.  All other systems reviewed and are negative.  Physical Exam Updated Vital Signs BP 130/86    Pulse 78    Temp 98.2 F (36.8 C) (Oral)    Resp 20    Ht 5\' 3"  (1.6 m)    Wt 55 kg    LMP 12/03/2012    SpO2 97%    BMI 21.48 kg/m   Physical Exam Vitals and nursing note reviewed.  Constitutional:      General: She is not in acute distress.    Appearance: She is well-developed.  HENT:     Head: Normocephalic and atraumatic.  Eyes:     Conjunctiva/sclera: Conjunctivae normal.  Cardiovascular:  Rate and Rhythm: Normal rate and regular rhythm.     Heart sounds: No murmur heard. Pulmonary:     Effort: Pulmonary effort is normal. No respiratory distress.     Breath sounds: Normal breath sounds.  Abdominal:     Palpations: Abdomen is soft.     Tenderness: There is no abdominal tenderness.  Musculoskeletal:        General: No swelling.     Cervical back: Neck supple.  Skin:    General: Skin is warm and dry.     Capillary Refill: Capillary refill takes less than 2 seconds.  Neurological:     Mental Status: She is alert.  Psychiatric:        Mood and Affect: Mood normal.    ED Results / Procedures / Treatments   Labs (all labs ordered are listed, but only abnormal results are displayed) Labs Reviewed  BASIC METABOLIC PANEL - Abnormal; Notable for the following components:      Result Value   Potassium 3.3 (*)    Glucose, Bld 109 (*)    BUN 7 (*)    All other components within normal limits  CBC - Abnormal; Notable for the following components:   Hemoglobin 15.5 (*)    All other components within normal limits  TROPONIN I (HIGH SENSITIVITY)  TROPONIN I (HIGH SENSITIVITY)    EKG EKG Interpretation  Date/Time:  Thursday November 25 2021 06:42:53 EST Ventricular Rate:  111 PR Interval:  148 QRS Duration: 122 QT Interval:  382 QTC Calculation: 519 R Axis:   99 Text Interpretation: Sinus tachycardia Left bundle branch  block as seen on previous EKG 09-30-2014 Left ventricular hypertrophy with QRS widening and repolarization abnormality ( Cornell product ) Confirmed by Campbell Stall ([id number]) on 98/92/1194 8:13:52 AM  Radiology DG Chest 2 View  Result Date: 11/25/2021 CLINICAL DATA:  Chest pain and dry cough EXAM: CHEST - 2 VIEW COMPARISON:  02/12/2019 chest radiograph FINDINGS: Small left hydropneumothorax. Paraseptal emphysema with left apical blebs. Normal heart size and mediastinal contours. Critical Value/emergent results were called by telephone at the time of interpretation on 11/25/2021 at 7:18 am to Triage RN (pt in waiting room) Roselyn Reef , who verbally acknowledged these results. IMPRESSION: Small hydropneumothorax on the left. 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 Electronically Signed   By: Jorje Guild M.D.   On: 11/25/2021 07:23   CT Angio Chest PE W and/or Wo Contrast  Result Date: 11/25/2021 CLINICAL DATA:  Left-sided chest pain, shortness of breath with exertion EXAM: CT ANGIOGRAPHY CHEST WITH CONTRAST TECHNIQUE: Multidetector CT imaging of the chest was performed using the standard protocol during bolus administration of intravenous contrast. Multiplanar CT image reconstructions and MIPs were obtained to evaluate the vascular anatomy. CONTRAST:  44mL OMNIPAQUE IOHEXOL 350 MG/ML SOLN COMPARISON:  Same day chest radiograph, CT chest 01/06/2021 FINDINGS: Cardiovascular: There is adequate opacification of the pulmonary arteries to the segmental level. There is no evidence of pulmonary embolism. The heart is not enlarged. There is no pericardial effusion. There is mild calcified atherosclerotic plaque of the aortic arch. Mediastinum/Nodes: The thyroid is unremarkable. The esophagus is grossly unremarkable. There is no mediastinal, hilar, or axillary lymphadenopathy. Lungs/Pleura: The trachea and central airways are patent. There is central bronchial wall thickening bilaterally, worse on the left.  There is a small left hydropneumothorax, as seen on prior radiograph. There is consolidation of the left lower lobe with patchy enhancement. Linear opacity in the right lung base likely reflects atelectasis.  There is centrilobular and paraseptal emphysema in the lung apices with large left upper lobe bullae. Upper Abdomen: The imaged portions of the upper abdominal viscera are unremarkable. Musculoskeletal: There is no acute osseous abnormality or aggressive osseous lesion. Review of the MIP images confirms the above findings. IMPRESSION: 1. No evidence of pulmonary embolism. 2. Small left hydropneumothorax. 3. Consolidation in the left base is suspicious for pneumonia. 4. Background of centrilobular and paraseptal emphysema with large left upper lobe bullae. Aortic Atherosclerosis (ICD10-I70.0) and Emphysema (ICD10-J43.9). Electronically Signed   By: Valetta Mole M.D.   On: 11/25/2021 10:04    Procedures Procedures   Medications Ordered in ED Medications  potassium chloride SA (KLOR-CON M) CR tablet 20 mEq (20 mEq Oral Given 11/25/21 0843)  iohexol (OMNIPAQUE) 350 MG/ML injection 55 mL (55 mLs Intravenous Contrast Given 11/25/21 0955)    ED Course  I have reviewed the triage vital signs and the nursing notes.  Pertinent labs & imaging results that were available during my care of the patient were reviewed by me and considered in my medical decision making (see chart for details).    MDM Rules/Calculators/A&P                         11:42 AM 60 yo female with pmh of DM, HTN compliant on losartan, and tobacco use presenting for chest pain.  ECG stable. No ST segment elevation or depression. Troponin stable.  CT PE study suspicion for pneumonia. No PE. Bullae present-pt aware. No changes. Recommendations for antibiotics and close follow up with pulmonary. Patient also recommended for follow up with cardiology for stress test if chest pain continues.  Patient in no distress and overall  condition improved here in the ED. Detailed discussions were had with the patient regarding current findings, and need for close f/u with PCP or on call doctor. The patient has been instructed to return immediately if the symptoms worsen in any way for re-evaluation. Patient verbalized understanding and is in agreement with current care plan. All questions answered prior to discharge.     Final Clinical Impression(s) / ED Diagnoses Final diagnoses:  Chronic pneumonia  Abnormal CT of the chest-lung bullae  Chest pain, unspecified type  Secondary hypertension  Tobacco use    Rx / DC Orders ED Discharge Orders          Ordered    doxycycline (VIBRAMYCIN) 100 MG capsule  2 times daily        11/25/21 1141             Lianne Cure, DO 27/06/23 1143

## 2021-12-17 ENCOUNTER — Other Ambulatory Visit: Payer: Self-pay

## 2021-12-17 ENCOUNTER — Ambulatory Visit (INDEPENDENT_AMBULATORY_CARE_PROVIDER_SITE_OTHER): Payer: PPO | Admitting: Primary Care

## 2021-12-17 ENCOUNTER — Ambulatory Visit
Admission: RE | Admit: 2021-12-17 | Discharge: 2021-12-17 | Disposition: A | Discharge status: Home / Self Care | Payer: PPO | Source: Ambulatory Visit | Attending: Primary Care | Admitting: Primary Care

## 2021-12-17 ENCOUNTER — Encounter: Payer: Self-pay | Admitting: Primary Care

## 2021-12-17 VITALS — BP 120/82 | HR 91 | Temp 97.8°F | Ht 63.0 in | Wt 110.0 lb

## 2021-12-17 DIAGNOSIS — G4734 Idiopathic sleep related nonobstructive alveolar hypoventilation: Secondary | ICD-10-CM

## 2021-12-17 DIAGNOSIS — J189 Pneumonia, unspecified organism: Secondary | ICD-10-CM

## 2021-12-17 DIAGNOSIS — R0602 Shortness of breath: Secondary | ICD-10-CM | POA: Diagnosis not present

## 2021-12-17 DIAGNOSIS — J432 Centrilobular emphysema: Secondary | ICD-10-CM

## 2021-12-17 NOTE — Patient Instructions (Addendum)
Recommendations: Restart Spiriva - take two puffs daily in the morning (please take every day) Take Robitussin every 4 hours as needed for cough/congestion  You are due for pneumonia vaccine, would not recommend you take it same day as Enbrel. Please go to pharmacy for either Prevnar 20 vaccine ( if not available you need Prevnar 15 vaccine first and then pneumococcal 23 one year after to complete series)   Orders: CXR today  PFTs re: COPD/ RA  6MWT   Follow-up: First available with Dr. Mortimer Fries       Chronic Obstructive Pulmonary Disease Chronic obstructive pulmonary disease (COPD) is a long-term (chronic) lung problem. When you have COPD, it is hard for air to get in and out of your lungs. Usually the condition gets worse over time, and your lungs will never return to normal. There are things you can do to keep yourself as healthy as possible. What are the causes? Smoking. This is the most common cause. Certain genes passed from parent to child (inherited). What increases the risk? Being exposed to secondhand smoke from cigarettes, pipes, or cigars. Being exposed to chemicals and other irritants, such as fumes and dust in the work environment. Having chronic lung conditions or infections. What are the signs or symptoms? Shortness of breath, especially during physical activity. A long-term cough with a large amount of thick mucus. Sometimes, the cough may not have any mucus (dry cough). Wheezing. Breathing quickly. Skin that looks gray or blue, especially in the fingers, toes, or lips. Feeling tired (fatigue). Weight loss. Chest tightness. Having infections often. Episodes when breathing symptoms become much worse (exacerbations). At the later stages of this disease, you may have swelling in the ankles, feet, or legs. How is this treated? Taking medicines. Quitting smoking, if you smoke. Rehabilitation. This includes steps to make your body work better. It may involve a team  of specialists. Doing exercises. Making changes to your diet. Using oxygen. Lung surgery. Lung transplant. Comfort measures (palliative care). Follow these instructions at home: Medicines Take over-the-counter and prescription medicines only as told by your doctor. Talk to your doctor before taking any cough or allergy medicines. You may need to avoid medicines that cause your lungs to be dry. Lifestyle If you smoke, stop smoking. Smoking makes the problem worse. Do not smoke or use any products that contain nicotine or tobacco. If you need help quitting, ask your doctor. Avoid being around things that make your breathing worse. This may include smoke, chemicals, and fumes. Stay active, but remember to rest as well. Learn and use tips on how to manage stress and control your breathing. Make sure you get enough sleep. Most adults need at least 7 hours of sleep every night. Eat healthy foods. Eat smaller meals more often. Rest before meals. Controlled breathing Learn and use tips on how to control your breathing as told by your doctor. Try: Breathing in (inhaling) through your nose for 1 second. Then, pucker your lips and breath out (exhale) through your lips for 2 seconds. Putting one hand on your belly (abdomen). Breathe in slowly through your nose for 1 second. Your hand on your belly should move out. Pucker your lips and breathe out slowly through your lips. Your hand on your belly should move in as you breathe out.  Controlled coughing Learn and use controlled coughing to clear mucus from your lungs. Follow these steps: Lean your head a little forward. Breathe in deeply. Try to hold your breath for 3 seconds. Keep  your mouth slightly open while coughing 2 times. Spit any mucus out into a tissue. Rest and do the steps again 1 or 2 times as needed. General instructions Make sure you get all the shots (vaccines) that your doctor recommends. Ask your doctor about a flu shot and a  pneumonia shot. Use oxygen therapy and pulmonary rehabilitation if told by your doctor. If you need home oxygen therapy, ask your doctor if you should buy a tool to measure your oxygen level (oximeter). Make a COPD action plan with your doctor. This helps you to know what to do if you feel worse than usual. Manage any other conditions you have as told by your doctor. Avoid going outside when it is very hot, cold, or humid. Avoid people who have a sickness you can catch (contagious). Keep all follow-up visits. Contact a doctor if: You cough up more mucus than usual. There is a change in the color or thickness of the mucus. It is harder to breathe than usual. Your breathing is faster than usual. You have trouble sleeping. You need to use your medicines more often than usual. You have trouble doing your normal activities such as getting dressed or walking around the house. Get help right away if: You have shortness of breath while resting. You have shortness of breath that stops you from: Being able to talk. Doing normal activities. Your chest hurts for longer than 5 minutes. Your skin color is more blue than usual. Your pulse oximeter shows that you have low oxygen for longer than 5 minutes. You have a fever. You feel too tired to breathe normally. These symptoms may represent a serious problem that is an emergency. Do not wait to see if the symptoms will go away. Get medical help right away. Call your local emergency services (911 in the [place] Do not drive yourself to the hospital. Summary Chronic obstructive pulmonary disease (COPD) is a long-term lung problem. The way your lungs work will never return to normal. Usually the condition gets worse over time. There are things you can do to keep yourself as healthy as possible. Take over-the-counter and prescription medicines only as told by your doctor. If you smoke, stop. Smoking makes the problem worse. This information is not intended  to replace advice given to you by your health care provider. Make sure you discuss any questions you have with your health care provider. Document Revised: 09/22/2020 Document Reviewed: 09/22/2020 Elsevier Patient Education  2022 [employer].

## 2021-12-17 NOTE — Progress Notes (Signed)
@Patient  ID: Kim Braun, female    DOB: 05/03/1961, 61 y.o.   MRN: [id number]  Chief Complaint  Patient presents with   Follow-up    Referring provider: Ria Bush, MD  HPI: 61 year old female, every day smoker. PMH significant for HTN, COPD, emphysema, cardiomyopathy, thyroid nodule, RA, osteoporosis, hyperlipidemia. Patient of Dr. Mortimer Fries, last seen in January 2022.   12/17/2021 Patient presents today for ED follow-up. She was seen in the ED on 12/29 for pneumonia. She is feeling better, left sided pain has resolved. She still has productive cough with clear thick mucus. She is not taking anything over the counter for her cough. She will intermittently use Spiriva Respimat inhaler. She stopped wearing oxygen at night d/t noise. Recently received new concentrator and is now compliant with use. She has rheumatoid arthritis. She had a break in treatment for 3 months when her RA flared. She is currently on embrel. She follows with Dr. Jenetta Downer with Rheumatology.  She has an apt with cardiology February 7th.    No Known Allergies  Immunization History  Administered Date(s) Administered   Influenza, Quadrivalent, Recombinant, Inj, Pf 09/08/2017, 08/17/2020, 08/18/2021   Influenza,inj,Quad PF,6-35 Mos 08/16/2019   PFIZER(Purple Top)SARS-COV-2 Vaccination 02/12/2020, 03/04/2020   Pneumococcal Polysaccharide-23 12/20/2012   Td 07/28/2005    Past Medical History:  Diagnosis Date   COPD (chronic obstructive pulmonary disease) ([place]) 01/2019   History of cardiac murmur as a child    child   History of gestational diabetes    Hypertension    Left bundle branch block    11/2012 low risk, no ischemia, NL EF stress test (Dr. Marlou Porch)   Rheumatoid arthritis(714.0)    Smoker    Thyroid nodule     Tobacco History: Social History   Tobacco Use  Smoking Status Every Day   Packs/day: 1.00   Years: 44.00   Pack years: 44.00   Types: Cigarettes  Smokeless Tobacco Never  Tobacco  Comments   smoking 1/2 ppd.  very stressed. - 12/17/2021   Ready to quit: Not Answered Counseling given: Not Answered Tobacco comments: smoking 1/2 ppd.  very stressed. - 12/17/2021   Outpatient Medications Prior to Visit  Medication Sig Dispense Refill   albuterol (PROVENTIL HFA;VENTOLIN HFA) 108 (90 BASE) MCG/ACT inhaler Inhale 2 puffs into the lungs daily as needed for wheezing or shortness of breath.     alendronate (FOSAMAX) 70 MG tablet Take 70 mg by mouth once a week.     betamethasone dipropionate 0.05 % cream Apply topically 2 (two) times daily.     Biotin 10 MG TABS 1 tablet     CALCIUM-VITAMIN D PO Take by mouth daily. Calcium 600 mg + vit D3     Cholecalciferol (VITAMIN D3 PO) Take 2,000 Units by mouth.     Etanercept (ENBREL MINI) 50 MG/ML SOCT Inject into the skin.     L-Lysine 500 MG TABS Take by mouth daily.     potassium chloride SA (KLOR-CON) 10 MEQ tablet Take 1 tablet (10 mEq total) by mouth daily. 90 tablet 3   Tiotropium Bromide Monohydrate (SPIRIVA RESPIMAT) 2.5 MCG/ACT AERS Inhale 2 puffs into the lungs daily. 4 g 2   valsartan (DIOVAN) 80 MG tablet Take 1 tablet (80 mg total) by mouth daily. 30 tablet 11   Adalimumab (HUMIRA PEN) 40 MG/0.4ML PNKT Inject 40mg  subcutaneously every *OTHER week as directed     doxycycline (VIBRAMYCIN) 100 MG capsule Take 1 capsule (100 mg total) by  mouth 2 (two) times daily. 20 capsule 0   No facility-administered medications prior to visit.    Review of Systems  Review of Systems  Constitutional: Negative.   HENT: Negative.    Respiratory:  Positive for cough. Negative for chest tightness, wheezing and stridor.     Physical Exam  BP 120/82 (BP Location: Left Arm, Patient Position: Sitting, Cuff Size: Normal)    Pulse 91    Temp 97.8 F (36.6 C) (Oral)    Ht 5\' 3"  (1.6 m)    Wt 110 lb (49.9 kg)    LMP 12/03/2012    SpO2 97%    BMI 19.49 kg/m  Physical Exam Constitutional:      General: She is not in acute distress.     Appearance: Normal appearance.  HENT:     Head: Normocephalic and atraumatic.  Cardiovascular:     Rate and Rhythm: Normal rate and regular rhythm.  Pulmonary:     Effort: Pulmonary effort is normal.     Breath sounds: Normal breath sounds. No wheezing, rhonchi or rales.  Musculoskeletal:        General: Normal range of motion.  Skin:    General: Skin is warm and dry.  Neurological:     Mental Status: She is alert.  Psychiatric:        Mood and Affect: Mood normal.        Behavior: Behavior normal.        Thought Content: Thought content normal.        Judgment: Judgment normal.     Lab Results:  CBC    Component Value Date/Time   WBC 7.9 11/25/2021 0727   RBC 4.73 11/25/2021 0727   HGB 15.5 (H) 11/25/2021 0727   HGB 14.4 12/07/2017 0918   HCT 45.9 11/25/2021 0727   HCT 43.0 12/07/2017 0918   PLT 363 11/25/2021 0727   PLT 327 12/07/2017 0918   MCV 97.0 11/25/2021 0727   MCV 92 12/07/2017 0918   MCH 32.8 11/25/2021 0727   MCHC 33.8 11/25/2021 0727   RDW 14.6 11/25/2021 0727   RDW 14.2 12/07/2017 0918   LYMPHSABS 1.7 03/15/2021 0811   LYMPHSABS 2.5 12/07/2017 0918   MONOABS 1.2 (H) 03/15/2021 0811   EOSABS 0.1 03/15/2021 0811   EOSABS 0.1 12/07/2017 0918   BASOSABS 0.0 03/15/2021 0811   BASOSABS 0.0 12/07/2017 0918    BMET    Component Value Date/Time   NA 136 11/25/2021 0727   NA 139 02/15/2021 0000   K 3.3 (L) 11/25/2021 0727   CL 101 11/25/2021 0727   CO2 24 11/25/2021 0727   GLUCOSE 109 (H) 11/25/2021 0727   BUN 7 (L) 11/25/2021 0727   BUN 8 12/07/2017 0918   CREATININE 0.70 11/25/2021 0727   CALCIUM 8.9 11/25/2021 0727   GFRNONAA >60 11/25/2021 0727   GFRAA >60 02/20/2020 1002    BNP No results found for: BNP  ProBNP No results found for: PROBNP  Imaging: DG Chest 2 View  Result Date: 12/17/2021 CLINICAL DATA:  Follow-up community-acquired pneumonia. EXAM: CHEST - 2 VIEW COMPARISON:  Chest two views 11/25/2021 CTA chest 11/25/2021  FINDINGS: On the prior 11/25/2021 radiograph and CT, there was a small posteroinferior left-sided pneumothorax. The previously seen left lower lobe air-fluid levels of the hydropneumothorax are no longer visualized. There is very subtle density within the posteroinferior lungs on lateral view, appearing very similar to more remote baseline 01/01/2019 radiographs. The right lung is clear. Cardiac silhouette and  mediastinal contours are within normal limits with calcification seen within the aortic arch. Mild levocurvature of the upper lumbar spine with mild multilevel degenerative disc changes. IMPRESSION:: IMPRESSION: 1. The prior 11/25/2021 posteroinferior left lower lobe hydropneumothorax appears to have resolved. There is only minimal likely subsegmental atelectasis in this region, similar to 01/01/2019 more remote radiographs. 2. No pleural effusion. Electronically Signed   By: Yvonne Kendall M.D.   On: 12/17/2021 18:44   DG Chest 2 View  Result Date: 11/25/2021 CLINICAL DATA:  Chest pain and dry cough EXAM: CHEST - 2 VIEW COMPARISON:  02/12/2019 chest radiograph FINDINGS: Small left hydropneumothorax. Paraseptal emphysema with left apical blebs. Normal heart size and mediastinal contours. Critical Value/emergent results were called by telephone at the time of interpretation on 11/25/2021 at 7:18 am to Triage RN (pt in waiting room) Roselyn Reef , who verbally acknowledged these results. IMPRESSION: Small hydropneumothorax on the left. 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 Electronically Signed   By: Jorje Guild M.D.   On: 11/25/2021 07:23   CT Angio Chest PE W and/or Wo Contrast  Result Date: 11/25/2021 CLINICAL DATA:  Left-sided chest pain, shortness of breath with exertion EXAM: CT ANGIOGRAPHY CHEST WITH CONTRAST TECHNIQUE: Multidetector CT imaging of the chest was performed using the standard protocol during bolus administration of intravenous contrast. Multiplanar CT image  reconstructions and MIPs were obtained to evaluate the vascular anatomy. CONTRAST:  63mL OMNIPAQUE IOHEXOL 350 MG/ML SOLN COMPARISON:  Same day chest radiograph, CT chest 01/06/2021 FINDINGS: Cardiovascular: There is adequate opacification of the pulmonary arteries to the segmental level. There is no evidence of pulmonary embolism. The heart is not enlarged. There is no pericardial effusion. There is mild calcified atherosclerotic plaque of the aortic arch. Mediastinum/Nodes: The thyroid is unremarkable. The esophagus is grossly unremarkable. There is no mediastinal, hilar, or axillary lymphadenopathy. Lungs/Pleura: The trachea and central airways are patent. There is central bronchial wall thickening bilaterally, worse on the left. There is a small left hydropneumothorax, as seen on prior radiograph. There is consolidation of the left lower lobe with patchy enhancement. Linear opacity in the right lung base likely reflects atelectasis. There is centrilobular and paraseptal emphysema in the lung apices with large left upper lobe bullae. Upper Abdomen: The imaged portions of the upper abdominal viscera are unremarkable. Musculoskeletal: There is no acute osseous abnormality or aggressive osseous lesion. Review of the MIP images confirms the above findings. IMPRESSION: 1. No evidence of pulmonary embolism. 2. Small left hydropneumothorax. 3. Consolidation in the left base is suspicious for pneumonia. 4. Background of centrilobular and paraseptal emphysema with large left upper lobe bullae. Aortic Atherosclerosis (ICD10-I70.0) and Emphysema (ICD10-J43.9). Electronically Signed   By: Valetta Mole M.D.   On: 11/25/2021 10:04     Assessment & Plan:   Centrilobular emphysema (HCC) - Resume Spiriva respimat two puffs once daily  - Needs updated PFTs and 6MWT - Due for pneumonia vaccine (advised she get at pharmacy early next week as she is due for enbrel today) - Strongly encourage smoking cessation  - FU first  available with Dr. Mortimer Fries or sooner if needed   CAP (community acquired pneumonia) - Clinically improved; Completed Doxycycline x 7 days. Residual cough with thick clear mucus  - Advised she take Robitussin every 4 hours as needed for cough/congestion  - CXR showed resolution left hydropneumothorax, minimal stable atelectasis   Nocturnal hypoxia - Continue supplemental oxygen at bedtime   Martyn Ehrich, NP 12/20/2021

## 2021-12-20 DIAGNOSIS — J189 Pneumonia, unspecified organism: Secondary | ICD-10-CM | POA: Insufficient documentation

## 2021-12-20 DIAGNOSIS — G4734 Idiopathic sleep related nonobstructive alveolar hypoventilation: Secondary | ICD-10-CM | POA: Insufficient documentation

## 2021-12-20 HISTORY — DX: Pneumonia, unspecified organism: J18.9

## 2021-12-20 NOTE — Progress Notes (Signed)
Pleaser let patient know hydopneumothorax has resolved

## 2021-12-20 NOTE — Assessment & Plan Note (Signed)
-   Continue supplemental oxygen at bedtime

## 2021-12-20 NOTE — Assessment & Plan Note (Addendum)
-   Clinically improved; Completed Doxycycline x 7 days. Residual cough with thick clear mucus  - Advised she take Robitussin every 4 hours as needed for cough/congestion  - CXR showed resolution left hydropneumothorax, minimal stable atelectasis

## 2021-12-20 NOTE — Assessment & Plan Note (Addendum)
-   Resume Spiriva respimat two puffs once daily  - Needs updated PFTs and 6MWT - Due for pneumonia vaccine (advised she get at pharmacy early next week as she is due for enbrel today) - Strongly encourage smoking cessation  - FU first available with Dr. Mortimer Fries or sooner if needed

## 2021-12-31 ENCOUNTER — Telehealth: Payer: Self-pay

## 2021-12-31 NOTE — Telephone Encounter (Signed)
Called and LVM in regards to upcoming appt, patient was due to have PFTS prior to next visit. Patient being seen on 2/8 by Kasa. Would like to know if she has any new or worsening symptoms, if not would like for patient to reschedule until pfts are complete.

## 2022-01-03 NOTE — Progress Notes (Signed)
Cardiology Office Note  Date:  01/04/2022   ID:  Kim, Braun Jun 14, 1961, MRN [id number]  PCP:  Ria Bush, MD   Chief Complaint  Patient presents with   New Patient (Initial Visit)    Ref by Dr. Danise Mina for Tachycardia. Medications reviewed by the patient verbally. Patient c/o shortness of breath and rapid heartbeats that come and go.     HPI:  [name] is a 61 year old woman with past medical history of Smoking, <1/2 pack COPD/emphysema Hypertension Hyperlipidemia Mildly depressed ejection fraction on echo November 2020 , Left bundle branch block Memory/cognitive impairment Referred by Dr. Danise Mina for tachycardia, chest pain  Left heart catheterization November 2015, No significant coronary disease, mild proximal calcification of the LAD, normal LV function  Er 11/25/21: chest pain, on left Hurt to take a breath,coughing CT scanning images reviewed  No evidence of pulmonary embolism. 2. Small left hydropneumothorax. 3. Consolidation in the left base is suspicious for pneumonia. 4. Background of centrilobular and paraseptal emphysema with large left upper lobe bullae. Aortic Atherosclerosis 3 vessel CAD CT images pulled up, heavy three-vessel coronary calcification noted    EKG personally reviewed by myself on todays visit Normal sinus rhythm rate 95 bpm left bundle branch block  PMH:   has a past medical history of COPD (chronic obstructive pulmonary disease) ([place]) (01/2019), History of cardiac murmur as a child, History of gestational diabetes, Hypertension, Left bundle branch block, Rheumatoid arthritis(714.0), Smoker, and Thyroid nodule.  PSH:    Past Surgical History:  Procedure Laterality Date   ABDOMINAL HYSTERECTOMY  12/19/2012   Procedure: HYSTERECTOMY ABDOMINAL;  Surgeon: Maeola Sarah. Landry Mellow, MD;  Location: [facility];  Service: Gynecology;  Laterality: N/A;  Incision @ 1517   ANTERIOR CERVICAL DECOMP/DISCECTOMY FUSION N/A 07/17/2013   Cervical  four-five, Cervical five-six Anterior cervical decompression/diskectomy/fusion;  Surgeon: Floyce Stakes, MD;  Location: [facility];  Service: Neurosurgery;  Laterality: N/A;  Cervical four-five, Cervical five-six Anterior cervical decompression/diskectomy/fusion   BILATERAL SALPINGECTOMY  12/19/2012   Procedure: BILATERAL SALPINGECTOMY;  Surgeon: Maeola Sarah. Landry Mellow, MD;  Location: [facility];  Service: Gynecology;  Laterality: Bilateral;   CARDIAC CATHETERIZATION  09/30/2014   Procedure: LEFT HEART CATH AND CORONARY ANGIOGRAPHY;  Surgeon: Laverda Page, MD;  Location: [facility];  Service: Cardiovascular;;   CESAREAN SECTION     COLON SURGERY  1992   CYSTOSCOPY  12/19/2012   Procedure: CYSTOSCOPY;  Surgeon: Maeola Sarah. Landry Mellow, MD;  Location: [facility];  Service: Gynecology;  Laterality: N/A;   ROBOTIC ASSISTED TOTAL HYSTERECTOMY  12/19/2012   total hysterectomy for fibroids, ovaries removed [name])    Current Outpatient Medications  Medication Sig Dispense Refill   albuterol (PROVENTIL HFA;VENTOLIN HFA) 108 (90 BASE) MCG/ACT inhaler Inhale 2 puffs into the lungs daily as needed for wheezing or shortness of breath.     alendronate (FOSAMAX) 70 MG tablet Take 70 mg by mouth once a week.     betamethasone dipropionate 0.05 % cream Apply topically 2 (two) times daily.     Biotin 10 MG TABS 1 tablet     CALCIUM-VITAMIN D PO Take by mouth daily. Calcium 600 mg + vit D3     Cholecalciferol (VITAMIN D3 PO) Take 2,000 Units by mouth.     Etanercept (ENBREL MINI) 50 MG/ML SOCT Inject into the skin.     L-Lysine 500 MG TABS Take by mouth daily.     potassium chloride SA (KLOR-CON) 10 MEQ tablet Take 1  tablet (10 mEq total) by mouth daily. 90 tablet 3   Tiotropium Bromide Monohydrate (SPIRIVA RESPIMAT) 2.5 MCG/ACT AERS Inhale 2 puffs into the lungs daily. 4 g 2   valsartan (DIOVAN) 80 MG tablet Take 1 tablet (80 mg total) by mouth daily. 30 tablet 11   No current facility-administered medications for this  visit.     Allergies:   Patient has no known allergies.   Social History:  The patient  reports that she has been smoking cigarettes. She has a 44.00 pack-year smoking history. She has never used smokeless tobacco. She reports current alcohol use of about 14.0 - 24.0 standard drinks per week. She reports that she does not use drugs.   Family History:   family history includes Breast cancer in her sister; CAD in her brother and mother; Diabetes in her sister; Hypertension in her brother, brother, and sister; Stroke in her mother.    Review of Systems: Review of Systems  Constitutional: Negative.   HENT: Negative.    Respiratory:  Positive for shortness of breath.   Cardiovascular:  Positive for chest pain.  Gastrointestinal: Negative.   Musculoskeletal: Negative.   Neurological: Negative.   Psychiatric/Behavioral: Negative.    All other systems reviewed and are negative.   PHYSICAL EXAM: VS:  BP (!) 160/90 (BP Location: Right Arm, Patient Position: Sitting, Cuff Size: Normal)    Pulse 95    Ht 5\' 3"  (1.6 m)    Wt 110 lb (49.9 kg)    LMP 12/03/2012    SpO2 98%    BMI 19.49 kg/m  , BMI Body mass index is 19.49 kg/m. GEN: Well nourished, well developed, in no acute distress HEENT: normal Neck: no JVD, carotid bruits, or masses Cardiac: RRR; no murmurs, rubs, or gallops,no edema  Respiratory: Mildly decreased breath sounds throughout GI: soft, nontender, nondistended, + BS MS: no deformity or atrophy Skin: warm and dry, no rash Neuro:  Strength and sensation are intact Psych: euthymic mood, full affect   Recent Labs: 03/15/2021: ALT 25; TSH 0.76 11/25/2021: BUN 7; Creatinine, Ser 0.70; Hemoglobin 15.5; Platelets 363; Potassium 3.3; Sodium 136    Lipid Panel Lab Results  Component Value Date   CHOL 245 (H) 03/15/2021   HDL 110.30 03/15/2021   LDLCALC 118 (H) 03/15/2021   TRIG 85.0 03/15/2021      Wt Readings from Last 3 Encounters:  01/04/22 110 lb (49.9 kg)   12/17/21 110 lb (49.9 kg)  11/25/21 121 lb 4.1 oz (55 kg)      ASSESSMENT AND PLAN:  Problem List Items Addressed This Visit       Cardiology Problems   Hypertension   Left bundle branch block   Atherosclerosis of aorta (HCC) - Primary   Hyperlipidemia     Other   Smoker   Coronary disease with stable angina Heavy three-vessel coronary calcification on CT scan Risk factors include untreated hyperlipidemia total cholesterol 245, smoker --Given recent episodes of chest pain, recommended Myoview Less beneficial to do cardiac CTA given heavy load of calcification --Recommend she start Crestor 10 daily with aspirin 81 daily Discussed anginal symptoms to watch for and to call us  Tachycardia Reports heart rate typically well controlled, denies paroxysmal tachycardia Avoid beta-blockers in the setting of COPD Meeting with pulmonary later today for 6-minute walk test Acute cessation recommended  COPD/emphysema/smoking We have encouraged her to continue to work on weaning her cigarettes and smoking cessation. She will continue to work on this and does not  want any assistance with chantix.    Hyperlipidemia High cholesterol Needs to come discussed with her in detail We will start Crestor 10 daily, lab work with primary care follow-up, goal LDL less than 70   Total encounter time more than 60 minutes  Greater than 50% was spent in counseling and coordination of care with the patient  Patient seen in consultation for Dr. Danise Mina, will refer back to his office for ongoing care of the issues detailed above   Signed, Esmond Plants, M.D., Ph.D. [place], [place]

## 2022-01-04 ENCOUNTER — Encounter: Payer: Self-pay | Admitting: Cardiovascular Disease

## 2022-01-04 ENCOUNTER — Ambulatory Visit (INDEPENDENT_AMBULATORY_CARE_PROVIDER_SITE_OTHER): Payer: PPO | Admitting: Cardiovascular Disease

## 2022-01-04 ENCOUNTER — Ambulatory Visit (INDEPENDENT_AMBULATORY_CARE_PROVIDER_SITE_OTHER): Payer: PPO

## 2022-01-04 ENCOUNTER — Other Ambulatory Visit: Payer: Self-pay

## 2022-01-04 VITALS — BP 160/90 | HR 95 | Ht 63.0 in | Wt 110.0 lb

## 2022-01-04 DIAGNOSIS — R0609 Other forms of dyspnea: Secondary | ICD-10-CM

## 2022-01-04 DIAGNOSIS — I447 Left bundle-branch block, unspecified: Secondary | ICD-10-CM | POA: Diagnosis not present

## 2022-01-04 DIAGNOSIS — I10 Essential (primary) hypertension: Secondary | ICD-10-CM

## 2022-01-04 DIAGNOSIS — I25118 Atherosclerotic heart disease of native coronary artery with other forms of angina pectoris: Secondary | ICD-10-CM | POA: Diagnosis not present

## 2022-01-04 DIAGNOSIS — E7800 Pure hypercholesterolemia, unspecified: Secondary | ICD-10-CM | POA: Diagnosis not present

## 2022-01-04 DIAGNOSIS — R072 Precordial pain: Secondary | ICD-10-CM | POA: Diagnosis not present

## 2022-01-04 DIAGNOSIS — F17200 Nicotine dependence, unspecified, uncomplicated: Secondary | ICD-10-CM

## 2022-01-04 DIAGNOSIS — I209 Angina pectoris, unspecified: Secondary | ICD-10-CM

## 2022-01-04 DIAGNOSIS — I700 Atherosclerosis of aorta: Secondary | ICD-10-CM | POA: Diagnosis not present

## 2022-01-04 MED ORDER — ROSUVASTATIN CALCIUM 10 MG PO TABS: 10.0000 mg | ORAL_TABLET | Freq: Every day | ORAL | 3 refills | Status: DC

## 2022-01-04 MED ORDER — ASPIRIN EC 81 MG PO TBEC: 81.0000 mg | DELAYED_RELEASE_TABLET | Freq: Every day | ORAL | 3 refills | Status: AC

## 2022-01-04 NOTE — Patient Instructions (Addendum)
[facility] Call for angina/CAD  Medication Instructions:  - Your physician has recommended you make the following change in your medication:   1) START crestor (rosuvastatin) 10 mg: - take 1 tablet by mouth once daily  2) START  Aspirin 81 mg: - take 1 tablet by mouth once daily  If you need a refill on your cardiac medications before your next appointment, please call your pharmacy.    Lab work: No new labs needed   Testing/Procedures: - Your physician has requested that you have a Lexicographer (Chemical stress test/ Cardiac Nuclear Scan)  [place]  Your caregiver has ordered a Stress Test with nuclear imaging. The purpose of this test is to evaluate the blood supply to your heart muscle. This procedure is referred to as a "Non-Invasive Stress Test." This is because other than having an IV started in your vein, nothing is inserted or "invades" your body. Cardiac stress tests are done to find areas of poor blood flow to the heart by determining the extent of coronary artery disease (CAD). Some patients exercise on a treadmill, which naturally increases the blood flow to your heart, while others who are  unable to walk on a treadmill due to physical limitations have a pharmacologic/chemical stress agent called Lexiscan . This medicine will mimic walking on a treadmill by temporarily increasing your coronary blood flow.   Please note: these test may take anywhere between 2-4 hours to complete  PLEASE REPORT TO [facility] AT THE FIRST DESK WILL DIRECT YOU WHERE TO GO  Date of Procedure:_____________________________________  Arrival Time for Procedure:______________________________  Instructions regarding medication:   __x__ : You may take all of your regular morning medications with enough water to get them down safely the day of your test  PLEASE NOTIFY THE OFFICE AT LEAST 24 HOURS IN ADVANCE IF YOU ARE UNABLE TO [place].   [phone] AND  PLEASE NOTIFY NUCLEAR MEDICINE AT [facility] AT LEAST 24 HOURS IN ADVANCE IF YOU ARE UNABLE TO KEEP YOUR APPOINTMENT. [phone]  How to prepare for your Myoview test:  Do not eat or drink after midnight No caffeine for 24 hours prior to test No smoking 24 hours prior to test. Your medication may be taken with water.  If your doctor stopped a medication because of this test, do not take that medication. Ladies, please do not wear dresses.  Skirts or pants are appropriate. Please wear a short sleeve shirt. No perfume, cologne or lotion. Wear comfortable walking shoes. No heels!   Follow-Up: At [facility], you and your health needs are our priority.  As part of our continuing mission to provide you with exceptional heart care, we have created designated Provider Care Teams.  These Care Teams include your primary Cardiologist (physician) and Advanced Practice Providers (APPs -  Physician Assistants and Nurse Practitioners) who all work together to provide you with the care you need, when you need it.  You will need a follow up appointment in 12 months  Providers on your designated Care Team:   Murray Hodgkins, NP Christell Faith, PA-C Cadence Kathlen Mody, [place]  COVID-19 Vaccine Information can be found at: [email or web address] For questions related to vaccine distribution or appointments, please email [email or web address] or call [phone].     CRESTOR (Rosuvastatin) Tablets What is this medication? ROSUVASTATIN (roe SOO va sta tin) treats high cholesterol and reduces the risk of heart attack and stroke. It works by decreasing bad cholesterol and fats (such as LDL,  triglycerides), and increasing good cholesterol (HDL) in your blood. It belongs to a group of medications called statins. Changes to diet and exercise are often combined with this medication. This medicine may be used for other purposes; ask your health care  provider or pharmacist if you have questions. COMMON BRAND NAME(S): Crestor What should I tell my care team before I take this medication? They need to know if you have any of these conditions: Diabetes (high blood sugar) If you often drink alcohol Kidney disease Liver disease Muscle cramps, pain Stroke Thyroid disease An unusual or allergic reaction to rosuvastatin, other medications, foods, dyes, or preservatives Pregnant or trying to get pregnant Breast-feeding How should I use this medication? Take this medication by mouth with a glass of water. Follow the directions on the prescription label. You can take it with or without food. If it upsets your stomach, take it with food. Do not cut, crush or chew this medication. Swallow the tablets whole. Take your medication at regular intervals. Do not take it more often than directed. Take antacids that have a combination of aluminum and magnesium hydroxide in them at a different time of day than this medication. Take these products 2 hours AFTER this medication. Talk to your care team about the use of this medication in children. While this medication may be prescribed for children as young as 7 for selected conditions, precautions do apply. Overdosage: If you think you have taken too much of this medicine contact a poison control center or emergency room at once. NOTE: This medicine is only for you. Do not share this medicine with others. What if I miss a dose? If you miss a dose, take it as soon as you can. If your next dose is to be taken in less than 12 hours, then do not take the missed dose. Take the next dose at your regular time. Do not take double or extra doses. What may interact with this medication? Do not take this medication with any of the following: Supplements like red yeast rice This medication may also interact with the following: Alcohol Antacids containing aluminum hydroxide and magnesium hydroxide Cyclosporine Other  medications for high cholesterol Some medications for HIV infection Warfarin This list may not describe all possible interactions. Give your health care provider a list of all the medicines, herbs, non-prescription drugs, or dietary supplements you use. Also tell them if you smoke, drink alcohol, or use illegal drugs. Some items may interact with your medicine. What should I watch for while using this medication? Visit your health care provider for regular checks on your progress. Tell your health care provider if your symptoms do not start to get better or if they get worse. Your health care provider may tell you to stop taking this medication if you develop muscle problems. If your muscle problems do not go away after stopping this medication, contact your health care provider. Do not become pregnant while taking this medication. Women should inform their health care provider if they wish to become pregnant or think they might be pregnant. There is potential for serious harm to an unborn child. Talk to your health care provider for more information. Do not breast-feed an infant while taking this medication. This medication may increase blood sugar. Ask your health care provider if changes in diet or medications are needed if you have diabetes. If you are going to need surgery or other procedure, tell your health care provider that you are using this medication. Taking  this medication is only part of a total heart healthy program. Your health care provider may give you a special diet to follow. Avoid alcohol. Avoid smoking. Ask your health care provider how much you should exercise. What side effects may I notice from receiving this medication? Side effects that you should report to your care team as soon as possible: Allergic reactions--skin rash, itching, hives, swelling of the face, lips, tongue, or throat High blood sugar (hyperglycemia)--increased thirst or amount of urine, unusual weakness,  fatigue, blurry vision Liver injury--right upper belly pain, loss of appetite, nausea, light-colored stool, dark yellow or brown urine, yellowing skin or eyes, unusual weakness, fatigue Muscle injury--unusual weakness, fatigue, muscle pain, dark yellow or brown urine, decrease in amount of urine Redness, blistering, peeling or loosening of the skin, including inside the mouth Side effects that usually do not require medical attention (report to your care team if they continue or are bothersome): Fatigue Headache Nausea Stomach pain This list may not describe all possible side effects. Call your doctor for medical advice about side effects. You may report side effects to FDA at 1-800-FDA-1088. Where should I keep my medication? Keep out of the reach of children and pets. Store between 20 and 25 degrees C (68 and 77 degrees F). Get rid of any unused medication after the expiration date. To get rid of medications that are no longer needed or have expired: Take the medication to a medication take-back program. Check with your pharmacy or law enforcement to find a location. If you cannot return the medication, check the label or package insert to see if the medication should be thrown out in the garbage or flushed down the toilet. If you are not sure, ask your care team. If it is safe to put it in the trash, take the medication out of the container. Mix the medication with cat litter, dirt, coffee grounds, or other unwanted substance. Seal the mixture in a bag or container. Put it in the trash. NOTE: This sheet is a summary. It may not cover all possible information. If you have questions about this medicine, talk to your doctor, pharmacist, or health care provider.  2022 Elsevier/Gold Standard (2020-12-11 00:00:00)    Cardiac Nuclear Scan A cardiac nuclear scan is a test that is done to check the flow of blood to your heart. It is done when you are resting and when you are exercising. The test  looks for problems such as: Not enough blood reaching a portion of the heart. The heart muscle not working as it should. You may need this test if: You have heart disease. You have had lab results that are not normal. You have had heart surgery or a balloon procedure to open up blocked arteries (angioplasty). You have chest pain. You have shortness of breath. In this test, a special dye (tracer) is put into your bloodstream. The tracer will travel to your heart. A camera will then take pictures of your heart to see how the tracer moves through your heart. This test is usually done at a hospital and takes 2-4 hours. Tell a doctor about: Any allergies you have. All medicines you are taking, including vitamins, herbs, eye drops, creams, and over-the-counter medicines. Any problems you or family members have had with anesthetic medicines. Any blood disorders you have. Any surgeries you have had. Any medical conditions you have. Whether you are pregnant or may be pregnant. What are the risks? Generally, this is a safe test. However, problems may  occur, such as: Serious chest pain and heart attack. This is only a risk if the stress portion of the test is done. Rapid heartbeat. A feeling of warmth in your chest. This feeling usually does not last long. Allergic reaction to the tracer. What happens before the test? Ask your doctor about changing or stopping your normal medicines. This is important. Follow instructions from your doctor about what you cannot eat or drink. Remove your jewelry on the day of the test. What happens during the test? An IV tube will be inserted into one of your veins. Your doctor will give you a small amount of tracer through the IV tube. You will wait for 20-40 minutes while the tracer moves through your bloodstream. Your heart will be monitored with an electrocardiogram (ECG). You will lie down on an exam table. Pictures of your heart will be taken for about  15-20 minutes. You may also have a stress test. For this test, one of these things may be done: You will be asked to exercise on a treadmill or a stationary bike. You will be given medicines that will make your heart work harder. This is done if you are unable to exercise. When blood flow to your heart has peaked, a tracer will again be given through the IV tube. After 20-40 minutes, you will get back on the exam table. More pictures will be taken of your heart. Depending on the tracer that is used, more pictures may need to be taken 3-4 hours later. Your IV tube will be removed when the test is over. The test may vary among doctors and hospitals. What happens after the test? Ask your doctor: Whether you can return to your normal schedule, including diet, activities, and medicines. Whether you should drink more fluids. This will help to remove the tracer from your body. Drink enough fluid to keep your pee (urine) pale yellow. Ask your doctor, or the department that is doing the test: When will my results be ready? How will I get my results? Summary A cardiac nuclear scan is a test that is done to check the flow of blood to your heart. Tell your doctor whether you are pregnant or may be pregnant. Before the test, ask your doctor about changing or stopping your normal medicines. This is important. Ask your doctor whether you can return to your normal activities. You may be asked to drink more fluids. This information is not intended to replace advice given to you by your health care provider. Make sure you discuss any questions you have with your health care provider. Document Revised: 07/28/2021 Document Reviewed: 04/28/2021 Elsevier Patient Education  [place].

## 2022-01-04 NOTE — Progress Notes (Signed)
SMW complete.  Moderate pace and mild SOB

## 2022-01-05 ENCOUNTER — Ambulatory Visit: Payer: PPO | Admitting: Internal Medicine

## 2022-01-05 ENCOUNTER — Encounter: Payer: Self-pay | Admitting: Internal Medicine

## 2022-01-05 VITALS — BP 136/88 | HR 87 | Temp 98.0°F | Ht 63.0 in | Wt 111.6 lb

## 2022-01-05 DIAGNOSIS — R918 Other nonspecific abnormal finding of lung field: Secondary | ICD-10-CM | POA: Diagnosis not present

## 2022-01-05 DIAGNOSIS — Z720 Tobacco use: Secondary | ICD-10-CM

## 2022-01-05 DIAGNOSIS — J449 Chronic obstructive pulmonary disease, unspecified: Secondary | ICD-10-CM

## 2022-01-05 NOTE — Patient Instructions (Signed)
Please stop smoking Continue Spiriva as prescribed Albuterol as needed Follow-up lung cancer screening protocol

## 2022-01-05 NOTE — Progress Notes (Signed)
Name: Kim Braun MRN: [id number] DOB: November 15, 1961     CONSULTATION DATE: 01/05/2022  REFERRING MD : Dr. Aileen Pilot  SYNOPSIS 60 year old pleasant white female seen today to establish care for assessment for COPD Patient is an active smoker of 1/2 pack/day for the last 52 to 23 years  She has been diagnosed with COPD 2 years ago Patient supposedly had a diagnosis of pneumonia at the same time   Patient has chronic shortness of breath chronic dyspnea on exertion Patient has a productive cough for many years which is consistent with chronic bronchitis   CHIEF COMPLAINT: Follow up Assessment for COPD  HPI  No exacerbation at this time No evidence of heart failure at this time No evidence or signs of infection at this time No respiratory distress No fevers, chills, nausea, vomiting, diarrhea No evidence of lower extremity edema No evidence hemoptysis   12/17/2021 ASSESSMENT Patient presents today for ED follow-up. She was seen in the ED on 12/29 for pneumonia. She is feeling better, left sided pain has resolved. She still has productive cough with clear thick mucus. She is not taking anything over the counter for her cough. She will intermittently use Spiriva Respimat inhaler. She stopped wearing oxygen at night d/t noise. Recently received new concentrator and is now compliant with use. She has rheumatoid arthritis. She had a break in treatment for 3 months when her RA flared. She is currently on embrel. She follows with Dr. Jenetta Downer with Rheumatology.  CXR showed resolution left hydropneumothorax, minimal stable atelectasis    Today, Patient is supposedly taking Spiriva Respimat however she is takes this intermittently and is not consistent  Smoking Assessment and Cessation Counseling Upon further questioning, Patient smokes 1/2 PPD I have advised patient to quit/stop smoking as soon as possible due to high risk for multiple medical problems  Patient is willing to quit smoking   I have advised patient that we can assist and have options of Nicotine replacement therapy. I also advised patient on behavioral therapy and can provide oral medication therapy in conjunction with the other therapies Follow up next Office visit  for assessment of smoking cessation Smoking cessation counseling advised for 4 minutes      PAST MEDICAL HISTORY :   has a past medical history of COPD (chronic obstructive pulmonary disease) ([place]) (01/2019), History of cardiac murmur as a child, History of gestational diabetes, Hypertension, Left bundle branch block, Rheumatoid arthritis(714.0), Smoker, and Thyroid nodule.  has a past surgical history that includes Cesarean section; Colon surgery (1992); Robotic assisted total hysterectomy (12/19/2012); Bilateral salpingectomy (12/19/2012); Abdominal hysterectomy (12/19/2012); Cystoscopy (12/19/2012); Anterior cervical decomp/discectomy fusion (N/A, 07/17/2013); and Cardiac catheterization (09/30/2014). Prior to Admission medications   Medication Sig Start Date End Date Taking? Authorizing Provider  Adalimumab (HUMIRA PEN) 40 MG/0.4ML PNKT Inject 40mg  subcutaneously every *OTHER week as directed 02/05/20  Yes [provider]  albuterol (PROVENTIL HFA;VENTOLIN HFA) 108 (90 BASE) MCG/ACT inhaler Inhale 2 puffs into the lungs daily as needed for wheezing or shortness of breath.   Yes [provider]  alendronate (FOSAMAX) 70 MG tablet Take 70 mg by mouth once a week. 11/12/20  Yes [provider]  betamethasone dipropionate 0.05 % cream Apply topically 2 (two) times daily.   Yes [provider]  Biotin 10 MG TABS 1 tablet   Yes [provider]  CALCIUM-VITAMIN D PO Take by mouth daily. Calcium 600 mg + vit D3   Yes [provider]  Cholecalciferol (VITAMIN D3  PO) Take 2,000 Units by mouth.   Yes [provider]  KLOR-CON M20 20 MEQ tablet Take 20 mEq by mouth daily. 09/14/20  Yes [provider]  L-Lysine 500 MG TABS Take by mouth daily.   Yes [provider]  losartan (COZAAR) 100 MG tablet Take 100 mg by mouth daily. 11/10/17  Yes [provider]  SPIRIVA HANDIHALER 18 MCG inhalation capsule 1 capsule daily. 08/24/20  Yes [provider]   No Known Allergies  FAMILY HISTORY:  family history includes Breast cancer in her sister; CAD in her brother and mother; Diabetes in her sister; Hypertension in her brother, brother, and sister; Stroke in her mother. SOCIAL HISTORY:  reports that she has been smoking cigarettes. She has a 44.00 pack-year smoking history. She has never used smokeless tobacco. She reports current alcohol use of about 14.0 - 24.0 standard drinks per week. She reports that she does not use drugs.    Review of Systems:  Gen:  Denies  fever, sweats, chills weight loss  HEENT: Denies blurred vision, double vision, ear pain, eye pain, hearing loss, nose bleeds, sore throat Cardiac:  No dizziness, chest pain or heaviness, chest tightness,edema, No JVD Resp:   No cough, -sputum production, +shortness of breath,-wheezing, -hemoptysis,  Other:  All other systems negative     BP 136/88 (BP Location: Left Arm, Patient Position: Sitting, Cuff Size: Normal)    Pulse 87    Temp 98 F (36.7 C) (Oral)    Ht 5\' 3"  (1.6 m)    Wt 111 lb 9.6 oz (50.6 kg)    LMP 12/03/2012    SpO2 98%    BMI 19.77 kg/m     SpO2: 98 % O2 Device: None (Room air)   Physical Examination:   GENERAL:NAD, no fevers, chills, no weakness no fatigue HEAD: Normocephalic, atraumatic.  EYES: PERLA, EOMI No scleral icterus.  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 All questions answered  Follow up in 6 months  Total time spent 31 mins  Maretta Bees Patricia Pesa, M.D.  [facility]  Medical Director [place] Director [facility] Cardio-Pulmonary Department

## 2022-01-14 ENCOUNTER — Ambulatory Visit
Admission: RE | Admit: 2022-01-14 | Discharge: 2022-01-14 | Disposition: A | Discharge status: Home / Self Care | Payer: PPO | Source: Ambulatory Visit | Attending: Cardiovascular Disease | Admitting: Cardiovascular Disease

## 2022-01-14 ENCOUNTER — Other Ambulatory Visit: Payer: Self-pay

## 2022-01-14 DIAGNOSIS — R072 Precordial pain: Secondary | ICD-10-CM

## 2022-01-14 LAB — NM MYOCAR MULTI W/SPECT W/WALL MOTION / EF
Estimated workload: 1
Exercise duration (min): 0 min
Exercise duration (sec): 0 s
LV dias vol: 39 mL (ref 46–106)
LV sys vol: 13 mL
MPHR: 157 {beats}/min
Nuc Stress EF: 67 %
Peak HR: 120 {beats}/min
Percent HR: 76 %
Rest HR: 80 {beats}/min
Rest Nuclear Isotope Dose: 10.7 mCi
SDS: 0
SRS: 8
SSS: 0
Stress Nuclear Isotope Dose: 32.7 mCi
TID: 0.75

## 2022-01-14 MED ORDER — TECHNETIUM TC 99M TETROFOSMIN IV KIT
10.0000 | PACK | Freq: Once | INTRAVENOUS | Status: AC | PRN
  Administered 2022-01-14: 10.72 via INTRAVENOUS

## 2022-01-14 MED ORDER — TECHNETIUM TC 99M TETROFOSMIN IV KIT
32.6800 | PACK | Freq: Once | INTRAVENOUS | Status: AC | PRN
  Administered 2022-01-14: 32.68 via INTRAVENOUS

## 2022-01-14 MED ORDER — REGADENOSON 0.4 MG/5ML IV SOLN
0.4000 mg | Freq: Once | INTRAVENOUS | Status: AC
  Administered 2022-01-14: 0.4 mg via INTRAVENOUS

## 2022-01-17 ENCOUNTER — Telehealth: Payer: Self-pay

## 2022-01-17 NOTE — Telephone Encounter (Signed)
Able to reach pt regarding her recent Lexiscan, Dr. Rockey Situ had a chance to review her results and advised   "Stress test  Normal study, low risk, no indication of significant blockages  Mild coronary calcification noted "  Kim Braun very thankful for the phone call of her results, all questions and concerns were address with nothing further at this time. Will see at next schedule f/u appt.

## 2022-01-18 ENCOUNTER — Telehealth: Payer: Self-pay | Admitting: Acute Care

## 2022-01-18 NOTE — Telephone Encounter (Signed)
Left voicemail and call back number for scheduling annual LDCT

## 2022-01-20 ENCOUNTER — Other Ambulatory Visit: Payer: Self-pay

## 2022-01-20 ENCOUNTER — Telehealth: Payer: Self-pay

## 2022-01-20 DIAGNOSIS — F17210 Nicotine dependence, cigarettes, uncomplicated: Secondary | ICD-10-CM

## 2022-01-20 DIAGNOSIS — Z87891 Personal history of nicotine dependence: Secondary | ICD-10-CM

## 2022-01-20 NOTE — Telephone Encounter (Signed)
Called and LVM in regards to upcoming covid test on 2/28, will try again later. Routing to triage.

## 2022-01-20 NOTE — Telephone Encounter (Signed)
Spoke with patient to schedule annual LDCT.  Reviewed last CTA 10/2021 with Eric Form NP who advised to schedule LDCT for March 2023. Patient was concerned about costs of CT.  She states it has been 'free' in the past.  Advised we currently do not have funds for assistance with patient costs of health services but she can contact her insurance first if needed.  Patient scheduled and if insurance does not approve scan, she will cancel.  Will wait to see if PA if confirmed.  Advised patient she may have out of pocket co-pays and radiology reading fees which may or may not be covered through insurance.

## 2022-01-21 NOTE — Telephone Encounter (Signed)
Lm x2 for patient.  Will close encounter per office protocol.   

## 2022-01-25 ENCOUNTER — Other Ambulatory Visit
Admission: RE | Admit: 2022-01-25 | Discharge: 2022-01-25 | Disposition: A | Discharge status: Home / Self Care | Payer: PPO | Source: Ambulatory Visit | Attending: Primary Care | Admitting: Primary Care

## 2022-01-25 ENCOUNTER — Other Ambulatory Visit: Payer: Self-pay

## 2022-01-25 DIAGNOSIS — Z20822 Contact with and (suspected) exposure to covid-19: Secondary | ICD-10-CM | POA: Insufficient documentation

## 2022-01-25 DIAGNOSIS — Z01812 Encounter for preprocedural laboratory examination: Secondary | ICD-10-CM | POA: Diagnosis not present

## 2022-01-25 LAB — SARS CORONAVIRUS 2 (TAT 6-24 HRS): SARS Coronavirus 2: NEGATIVE

## 2022-01-26 ENCOUNTER — Ambulatory Visit: Payer: PPO | Attending: Primary Care

## 2022-01-26 DIAGNOSIS — R0602 Shortness of breath: Secondary | ICD-10-CM | POA: Diagnosis not present

## 2022-01-26 DIAGNOSIS — Z7951 Long term (current) use of inhaled steroids: Secondary | ICD-10-CM | POA: Insufficient documentation

## 2022-01-26 DIAGNOSIS — F17210 Nicotine dependence, cigarettes, uncomplicated: Secondary | ICD-10-CM | POA: Diagnosis not present

## 2022-01-26 MED ORDER — ALBUTEROL SULFATE (2.5 MG/3ML) 0.083% IN NEBU
2.5000 mg | INHALATION_SOLUTION | Freq: Once | RESPIRATORY_TRACT | Status: AC
  Administered 2022-01-26: 2.5 mg via RESPIRATORY_TRACT
  Filled 2022-01-26: qty 3

## 2022-01-27 NOTE — Progress Notes (Signed)
PFTs consistent with mild-moderate obstructive airway disease/COPD. Borderline bronchodilator response. Continue outpatient treatment as directed by Dr. Mortimer Fries, return sooner if needed

## 2022-02-09 ENCOUNTER — Telehealth: Payer: Self-pay | Admitting: Family Medicine

## 2022-02-09 NOTE — Telephone Encounter (Signed)
Pt called asking for a referral to a Podiatrist. Pt states she has an appt for her physical 04/05/22. Please advise. ?

## 2022-02-10 NOTE — Telephone Encounter (Signed)
Patient called stating that she just wants a recommendation for podiatrist for heel spur that she has. States does not need a referral.  Please advise. ?

## 2022-02-11 NOTE — Telephone Encounter (Signed)
Lvm asking pt to call back.  Need to relay Dr. Synthia Innocent message and informing pt there is an office in [place] and [place].  ?

## 2022-02-11 NOTE — Telephone Encounter (Signed)
Pt returning your call

## 2022-02-11 NOTE — Telephone Encounter (Signed)
Where does she want to go? We usually recommend doctors at [facility].  ?

## 2022-02-11 NOTE — Telephone Encounter (Signed)
Spoke with pt relaying Dr. G's message.  Pt verbalizes understanding and expresses her thanks.  

## 2022-02-15 ENCOUNTER — Ambulatory Visit (INDEPENDENT_AMBULATORY_CARE_PROVIDER_SITE_OTHER): Payer: PPO

## 2022-02-15 ENCOUNTER — Ambulatory Visit: Payer: PPO | Admitting: Podiatry

## 2022-02-15 ENCOUNTER — Other Ambulatory Visit: Payer: Self-pay

## 2022-02-15 DIAGNOSIS — M722 Plantar fascial fibromatosis: Secondary | ICD-10-CM

## 2022-02-15 MED ORDER — METHYLPREDNISOLONE 4 MG PO TBPK
ORAL_TABLET | ORAL | 0 refills | Status: DC
Start: 2022-02-15 — End: 2022-08-03

## 2022-02-15 MED ORDER — BETAMETHASONE SOD PHOS & ACET 6 (3-3) MG/ML IJ SUSP
3.0000 mg | Freq: Once | INTRAMUSCULAR | Status: AC
  Administered 2022-02-15: 3 mg via INTRA_ARTICULAR

## 2022-02-15 MED ORDER — MELOXICAM 15 MG PO TABS: 15.0000 mg | ORAL_TABLET | Freq: Every day | ORAL | 1 refills | Status: DC

## 2022-02-15 NOTE — Progress Notes (Signed)
? ?Subjective: ?61 y.o. female presenting today as a new patient for evaluation of right heel pain this been going on for about 1 year now.  She denies a history of injury.  She says that her sister does have a history of plantar fasciitis as well.  She has tried icing her foot.  Presenting for further treatment and evaluation ? ? ?Past Medical History:  ?Diagnosis Date  ? COPD (chronic obstructive pulmonary disease) ([place]) 01/2019  ? History of cardiac murmur as a child   ? child  ? History of gestational diabetes   ? Hypertension   ? Left bundle branch block   ? 11/2012 low risk, no ischemia, NL EF stress test (Dr. Marlou Porch)  ? Rheumatoid arthritis(714.0)   ? Smoker   ? Thyroid nodule   ? ?Past Surgical History:  ?Procedure Laterality Date  ? ABDOMINAL HYSTERECTOMY  12/19/2012  ? Procedure: HYSTERECTOMY ABDOMINAL;  Surgeon: Maeola Sarah. Landry Mellow, MD;  Location: [facility];  Service: Gynecology;  Laterality: N/A;  Incision @ 1696  ? ANTERIOR CERVICAL DECOMP/DISCECTOMY FUSION N/A 07/17/2013  ? Cervical four-five, Cervical five-six Anterior cervical decompression/diskectomy/fusion;  Surgeon: Floyce Stakes, MD;  Location: [facility];  Service: Neurosurgery;  Laterality: N/A;  Cervical four-five, Cervical five-six Anterior cervical decompression/diskectomy/fusion  ? BILATERAL SALPINGECTOMY  12/19/2012  ? Procedure: BILATERAL SALPINGECTOMY;  Surgeon: Maeola Sarah. Landry Mellow, MD;  Location: [facility];  Service: Gynecology;  Laterality: Bilateral;  ? CARDIAC CATHETERIZATION  09/30/2014  ? Procedure: LEFT HEART CATH AND CORONARY ANGIOGRAPHY;  Surgeon: Laverda Page, MD;  Location: [facility] C[facility];  Service: Cardiovascular;;  ? CESAREAN SECTION    ? COLON SURGERY  1992  ? CYSTOSCOPY  12/19/2012  ? Procedure: CYSTOSCOPY;  Surgeon: Maeola Sarah. Landry Mellow, MD;  Location: [facility];  Service: Gynecology;  Laterality: N/A;  ? ROBOTIC ASSISTED TOTAL HYSTERECTOMY  12/19/2012  ? total hysterectomy for fibroids, ovaries removed [name])  ? ?No Known  Allergies ? ? ?Objective: ?Physical Exam ?General: The patient is alert and oriented x3 in no acute distress. ? ?Dermatology: Skin is warm, dry and supple bilateral lower extremities. Negative for open lesions or macerations bilateral.  ? ?Vascular: Dorsalis Pedis and Posterior Tibial pulses palpable bilateral.  Capillary fill time is immediate to all digits. ? ?Neurological: Epicritic and protective threshold intact bilateral.  ? ?Musculoskeletal: Tenderness to palpation to the plantar aspect of the right heel along the plantar fascia. All other joints range of motion within normal limits bilateral. Strength 5/5 in all groups bilateral.  ? ?Radiographic exam: ?Normal osseous mineralization. Joint spaces preserved. No fracture/dislocation/boney destruction. No other soft tissue abnormalities or radiopaque foreign bodies.  ? ?Assessment: ?1. Plantar fasciitis right ? ?Plan of Care:  ?1. Patient evaluated. Xrays reviewed.   ?2. Injection of 0.5cc Celestone soluspan injected into the right plantar fascia  ?3. Rx for Medrol Dose Pack placed ?4. Rx for Meloxicam ordered for patient. ?5. Plantar fascial band(s) dispensed ?6.  Night splint dispensed.  Wear nightly  ?7.  Instructed patient regarding therapies and modalities at home to alleviate symptoms.  ?8. Return to clinic in 4 weeks.   ? ? ?Edrick Kins, DPM ?[facility] ? ?Dr. Edrick Kins, DPM  ?  ?[address]                                        ?  [place], [place] [address]                ?Office [phone]  ?Fax [phone] ? ? ? ? ?

## 2022-02-23 ENCOUNTER — Other Ambulatory Visit: Payer: Self-pay

## 2022-02-23 ENCOUNTER — Ambulatory Visit
Admission: RE | Admit: 2022-02-23 | Discharge: 2022-02-23 | Disposition: A | Discharge status: Home / Self Care | Payer: PPO | Source: Ambulatory Visit | Attending: Acute Care | Admitting: Acute Care

## 2022-02-23 DIAGNOSIS — Z87891 Personal history of nicotine dependence: Secondary | ICD-10-CM | POA: Diagnosis not present

## 2022-02-23 DIAGNOSIS — F17210 Nicotine dependence, cigarettes, uncomplicated: Secondary | ICD-10-CM | POA: Insufficient documentation

## 2022-02-25 ENCOUNTER — Other Ambulatory Visit: Payer: Self-pay

## 2022-02-25 DIAGNOSIS — F17210 Nicotine dependence, cigarettes, uncomplicated: Secondary | ICD-10-CM

## 2022-02-25 DIAGNOSIS — Z87891 Personal history of nicotine dependence: Secondary | ICD-10-CM

## 2022-03-15 DIAGNOSIS — J449 Chronic obstructive pulmonary disease, unspecified: Secondary | ICD-10-CM | POA: Diagnosis not present

## 2022-03-18 ENCOUNTER — Ambulatory Visit (INDEPENDENT_AMBULATORY_CARE_PROVIDER_SITE_OTHER): Payer: PPO | Admitting: Podiatry

## 2022-03-18 DIAGNOSIS — M722 Plantar fascial fibromatosis: Secondary | ICD-10-CM

## 2022-03-18 MED ORDER — BETAMETHASONE SOD PHOS & ACET 6 (3-3) MG/ML IJ SUSP
3.0000 mg | Freq: Once | INTRAMUSCULAR | Status: AC
  Administered 2022-03-18: 3 mg via INTRA_ARTICULAR

## 2022-03-18 NOTE — Progress Notes (Signed)
? ?  Subjective: ?61 y.o. female presenting today for follow-up evaluation of plantar fasciitis to the right foot this been going on for about 1 year now.  Patient states that there is some modest improvement.  She says the injections helped as well as a prednisone pack.  She continues to have some pain however. ? ?Past Medical History:  ?Diagnosis Date  ? COPD (chronic obstructive pulmonary disease) ([place]) 01/2019  ? History of cardiac murmur as a child   ? child  ? History of gestational diabetes   ? Hypertension   ? Left bundle branch block   ? 11/2012 low risk, no ischemia, NL EF stress test (Dr. Marlou Porch)  ? Rheumatoid arthritis(714.0)   ? Smoker   ? Thyroid nodule   ? ?Past Surgical History:  ?Procedure Laterality Date  ? ABDOMINAL HYSTERECTOMY  12/19/2012  ? Procedure: HYSTERECTOMY ABDOMINAL;  Surgeon: Maeola Sarah. Landry Mellow, MD;  Location: [facility];  Service: Gynecology;  Laterality: N/A;  Incision @ 4132  ? ANTERIOR CERVICAL DECOMP/DISCECTOMY FUSION N/A 07/17/2013  ? Cervical four-five, Cervical five-six Anterior cervical decompression/diskectomy/fusion;  Surgeon: Floyce Stakes, MD;  Location: [facility];  Service: Neurosurgery;  Laterality: N/A;  Cervical four-five, Cervical five-six Anterior cervical decompression/diskectomy/fusion  ? BILATERAL SALPINGECTOMY  12/19/2012  ? Procedure: BILATERAL SALPINGECTOMY;  Surgeon: Maeola Sarah. Landry Mellow, MD;  Location: [facility];  Service: Gynecology;  Laterality: Bilateral;  ? CARDIAC CATHETERIZATION  09/30/2014  ? Procedure: LEFT HEART CATH AND CORONARY ANGIOGRAPHY;  Surgeon: Laverda Page, MD;  Location: [facility] CATH LAB;  Service: Cardiovascular;;  ? CESAREAN SECTION    ? COLON SURGERY  1992  ? CYSTOSCOPY  12/19/2012  ? Procedure: CYSTOSCOPY;  Surgeon: Maeola Sarah. Landry Mellow, MD;  Location: [facility];  Service: Gynecology;  Laterality: N/A;  ? ROBOTIC ASSISTED TOTAL HYSTERECTOMY  12/19/2012  ? total hysterectomy for fibroids, ovaries removed [name])  ? ?No Known Allergies ? ? ?Objective: ?Physical  Exam ?General: The patient is alert and oriented x3 in no acute distress. ? ?Dermatology: Skin is warm, dry and supple bilateral lower extremities. Negative for open lesions or macerations bilateral.  ? ?Vascular: Dorsalis Pedis and Posterior Tibial pulses palpable bilateral.  Capillary fill time is immediate to all digits. ? ?Neurological: Epicritic and protective threshold intact bilateral.  ? ?Musculoskeletal: There continues to be some tenderness to palpation to the plantar aspect of the right heel along the plantar fascia. All other joints range of motion within normal limits bilateral. Strength 5/5 in all groups bilateral.  ? ?Assessment: ?1. Plantar fasciitis right ? ?Plan of Care:  ?1. Patient evaluated. Xrays reviewed.   ?2. Injection of 0.5cc Celestone soluspan injected into the right plantar fascia  ?3.  Continue meloxicam 15 mg daily ?4.  Continue plantar fascial brace and night splint ?5.  OTC power step insoles dispensed ?6.  Return to clinic 4 weeks ? ? ?Edrick Kins, DPM ?[place] ? ?Dr. Edrick Kins, DPM  ?  ?[address]                                        ?[place], [place] [address]                ?Office [phone]  ?Fax [phone] ? ? ? ? ?

## 2022-03-19 ENCOUNTER — Other Ambulatory Visit: Payer: Self-pay | Admitting: Family Medicine

## 2022-03-19 DIAGNOSIS — N289 Disorder of kidney and ureter, unspecified: Secondary | ICD-10-CM

## 2022-03-19 DIAGNOSIS — M069 Rheumatoid arthritis, unspecified: Secondary | ICD-10-CM

## 2022-03-19 DIAGNOSIS — E7800 Pure hypercholesterolemia, unspecified: Secondary | ICD-10-CM

## 2022-03-19 DIAGNOSIS — M810 Age-related osteoporosis without current pathological fracture: Secondary | ICD-10-CM

## 2022-03-19 DIAGNOSIS — E041 Nontoxic single thyroid nodule: Secondary | ICD-10-CM

## 2022-03-25 ENCOUNTER — Telehealth: Payer: Self-pay

## 2022-03-25 NOTE — Telephone Encounter (Signed)
Patient called and stated that her right foot is still very painful and was requesting something sent to her pharmacy to help with the pain.  Please advise  ?

## 2022-03-28 ENCOUNTER — Ambulatory Visit (INDEPENDENT_AMBULATORY_CARE_PROVIDER_SITE_OTHER): Payer: PPO

## 2022-03-28 VITALS — Ht 63.0 in | Wt 110.0 lb

## 2022-03-28 DIAGNOSIS — Z0000 Encounter for general adult medical examination without abnormal findings: Secondary | ICD-10-CM | POA: Diagnosis not present

## 2022-03-28 NOTE — Patient Instructions (Signed)
Ms. Osterloh , ?Thank you for taking time to come for your Medicare Wellness Visit. I appreciate your ongoing commitment to your health goals. Please review the following plan we discussed and let me know if I can assist you in the future.  ? ?Screening recommendations/referrals: ?Colonoscopy: FOBT 04/06/2021, due 04/06/2022 ?Mammogram: completed 04/03/2021, due 04/04/2022 ?Bone Density: completed 08/17/2020 ?Recommended yearly ophthalmology/optometry visit for glaucoma screening and checkup ?Recommended yearly dental visit for hygiene and checkup ? ?Vaccinations: ?Influenza vaccine: due 06/28/2022 ?Pneumococcal vaccine: n/a ?Tdap vaccine: due ?Shingles vaccine: discussed  ?Covid-19:  03/04/2020, 02/12/2020 ? ?Advanced directives: Advance directive discussed with you today.  ? ?Conditions/risks identified: smoking ? ?Next appointment: Follow up in one year for your annual wellness visit.  ? ?Preventive Care 40-64 Years, Female ?Preventive care refers to lifestyle choices and visits with your health care provider that can promote health and wellness. ?What does preventive care include? ?A yearly physical exam. This is also called an annual well check. ?Dental exams once or twice a year. ?Routine eye exams. Ask your health care provider how often you should have your eyes checked. ?Personal lifestyle choices, including: ?Daily care of your teeth and gums. ?Regular physical activity. ?Eating a healthy diet. ?Avoiding tobacco and drug use. ?Limiting alcohol use. ?Practicing safe sex. ?Taking low-dose aspirin daily starting at age 76. ?Taking vitamin and mineral supplements as recommended by your health care provider. ?What happens during an annual well check? ?The services and screenings done by your health care provider during your annual well check will depend on your age, overall health, lifestyle risk factors, and family history of disease. ?Counseling  ?Your health care provider may ask you questions about your: ?Alcohol  use. ?Tobacco use. ?Drug use. ?Emotional well-being. ?Home and relationship well-being. ?Sexual activity. ?Eating habits. ?Work and work Statistician. ?Method of birth control. ?Menstrual cycle. ?Pregnancy history. ?Screening  ?You may have the following tests or measurements: ?Height, weight, and BMI. ?Blood pressure. ?Lipid and cholesterol levels. These may be checked every 5 years, or more frequently if you are over 1 years old. ?Skin check. ?Lung cancer screening. You may have this screening every year starting at age 61 if you have a 30-pack-year history of smoking and currently smoke or have quit within the past 15 years. ?Fecal occult blood test (FOBT) of the stool. You may have this test every year starting at age 7. ?Flexible sigmoidoscopy or colonoscopy. You may have a sigmoidoscopy every 5 years or a colonoscopy every 10 years starting at age 75. ?Hepatitis C blood test. ?Hepatitis B blood test. ?Sexually transmitted disease (STD) testing. ?Diabetes screening. This is done by checking your blood sugar (glucose) after you have not eaten for a while (fasting). You may have this done every 1-3 years. ?Mammogram. This may be done every 1-2 years. Talk to your health care provider about when you should start having regular mammograms. This may depend on whether you have a family history of breast cancer. ?BRCA-related cancer screening. This may be done if you have a family history of breast, ovarian, tubal, or peritoneal cancers. ?Pelvic exam and Pap test. This may be done every 3 years starting at age 2. Starting at age 65, this may be done every 5 years if you have a Pap test in combination with an HPV test. ?Bone density scan. This is done to screen for osteoporosis. You may have this scan if you are at high risk for osteoporosis. ?Discuss your test results, treatment options, and if necessary, the need  for more tests with your health care provider. ?Vaccines  ?Your health care provider may recommend  certain vaccines, such as: ?Influenza vaccine. This is recommended every year. ?Tetanus, diphtheria, and acellular pertussis (Tdap, Td) vaccine. You may need a Td booster every 10 years. ?Zoster vaccine. You may need this after age 28. ?Pneumococcal 13-valent conjugate (PCV13) vaccine. You may need this if you have certain conditions and were not previously vaccinated. ?Pneumococcal polysaccharide (PPSV23) vaccine. You may need one or two doses if you smoke cigarettes or if you have certain conditions. ?Talk to your health care provider about which screenings and vaccines you need and how often you need them. ?This information is not intended to replace advice given to you by your health care provider. Make sure you discuss any questions you have with your health care provider. ?Document Released: 12/11/2015 Document Revised: 08/03/2016 Document Reviewed: 09/15/2015 ?Elsevier Interactive Patient Education ? 2017 [employer] ? ? ? ?Fall Prevention in the Home ?Falls can cause injuries. They can happen to people of all ages. There are many things you can do to make your home safe and to help prevent falls. ?What can I do on the outside of my home? ?Regularly fix the edges of walkways and driveways and fix any cracks. ?Remove anything that might make you trip as you walk through a door, such as a raised step or threshold. ?Trim any bushes or trees on the path to your home. ?Use bright outdoor lighting. ?Clear any walking paths of anything that might make someone trip, such as rocks or tools. ?Regularly check to see if handrails are loose or broken. Make sure that both sides of any steps have handrails. ?Any raised decks and porches should have guardrails on the edges. ?Have any leaves, snow, or ice cleared regularly. ?Use sand or salt on walking paths during winter. ?Clean up any spills in your garage right away. This includes oil or grease spills. ?What can I do in the bathroom? ?Use night lights. ?Install grab bars  by the toilet and in the tub and shower. Do not use towel bars as grab bars. ?Use non-skid mats or decals in the tub or shower. ?If you need to sit down in the shower, use a plastic, non-slip stool. ?Keep the floor dry. Clean up any water that spills on the floor as soon as it happens. ?Remove soap buildup in the tub or shower regularly. ?Attach bath mats securely with double-sided non-slip rug tape. ?Do not have throw rugs and other things on the floor that can make you trip. ?What can I do in the bedroom? ?Use night lights. ?Make sure that you have a light by your bed that is easy to reach. ?Do not use any sheets or blankets that are too big for your bed. They should not hang down onto the floor. ?Have a firm chair that has side arms. You can use this for support while you get dressed. ?Do not have throw rugs and other things on the floor that can make you trip. ?What can I do in the kitchen? ?Clean up any spills right away. ?Avoid walking on wet floors. ?Keep items that you use a lot in easy-to-reach places. ?If you need to reach something above you, use a strong step stool that has a grab bar. ?Keep electrical cords out of the way. ?Do not use floor polish or wax that makes floors slippery. If you must use wax, use non-skid floor wax. ?Do not have throw rugs and other  things on the floor that can make you trip. ?What can I do with my stairs? ?Do not leave any items on the stairs. ?Make sure that there are handrails on both sides of the stairs and use them. Fix handrails that are broken or loose. Make sure that handrails are as long as the stairways. ?Check any carpeting to make sure that it is firmly attached to the stairs. Fix any carpet that is loose or worn. ?Avoid having throw rugs at the top or bottom of the stairs. If you do have throw rugs, attach them to the floor with carpet tape. ?Make sure that you have a light switch at the top of the stairs and the bottom of the stairs. If you do not have them, ask  someone to add them for you. ?What else can I do to help prevent falls? ?Wear shoes that: ?Do not have high heels. ?Have rubber bottoms. ?Are comfortable and fit you well. ?Are closed at the toe. Do not wear s

## 2022-03-28 NOTE — Telephone Encounter (Signed)
If the pain is severe enough she needs to come in and we will dispense a walking cam boot that she should wear for the next 2-3 weeks.  I will see her at her next scheduled appointment.  Please notify patient.  Thanks, Dr. Amalia Hailey

## 2022-03-28 NOTE — Progress Notes (Signed)
?I connected with [name] today by telephone and verified that I am speaking with the correct person using two identifiers. ?Location patient: home ?Location provider: work ?Persons participating in the virtual visit: Chastidy, Ranker LPN. ?  ?I discussed the limitations, risks, security and privacy concerns of performing an evaluation and management service by telephone and the availability of in person appointments. I also discussed with the patient that there may be a patient responsible charge related to this service. The patient expressed understanding and verbally consented to this telephonic visit.  ?  ?Interactive audio and video telecommunications were attempted between this provider and patient, however failed, due to patient having technical difficulties OR patient did not have access to video capability.  We continued and completed visit with audio only. ? ?  ? ?Vital signs may be patient reported or missing. ? ?Subjective:  ? Kim Braun is a 61 y.o. female who presents for Medicare Annual (Subsequent) preventive examination. ? ?Review of Systems    ? ?Cardiac Risk Factors include: hypertension ? ?   ?Objective:  ?  ?Today's Vitals  ? 03/28/22 1357  ?Weight: 110 lb (49.9 kg)  ?Height: '5\' 3"'$  (1.6 m)  ? ?Body mass index is 19.49 kg/m?. ? ? ?  03/28/2022  ?  2:03 PM 11/25/2021  ?  7:00 AM 03/26/2021  ?  2:08 PM 09/30/2014  ? 11:39 AM 07/15/2013  ?  1:34 PM 12/19/2012  ?  9:00 PM 12/03/2012  ?  9:44 AM  ?Advanced Directives  ?Does Patient Have a Medical Advance Directive? No No No No Patient does not have advance directive Patient does not have advance directive Patient does not have advance directive  ?Would patient like information on creating a medical advance directive?   No - Patient declined No - patient declined information     ?Pre-existing out of facility DNR order (yellow form or pink MOST form)      No   ? ? ?Current Medications (verified) ?Outpatient Encounter Medications as of  03/28/2022  ?Medication Sig  ? albuterol (PROVENTIL HFA;VENTOLIN HFA) 108 (90 BASE) MCG/ACT inhaler Inhale 2 puffs into the lungs daily as needed for wheezing or shortness of breath.  ? alendronate (FOSAMAX) 70 MG tablet Take 70 mg by mouth once a week.  ? aspirin EC 81 MG tablet Take 1 tablet (81 mg total) by mouth daily. Swallow whole.  ? betamethasone dipropionate 0.05 % cream Apply topically 2 (two) times daily.  ? Biotin 10 MG TABS 1 tablet  ? CALCIUM-VITAMIN D PO Take by mouth daily. Calcium 600 mg + vit D3  ? Cholecalciferol (VITAMIN D3 PO) Take 2,000 Units by mouth.  ? Etanercept (ENBREL MINI) 50 MG/ML SOCT Inject into the skin.  ? L-Lysine 500 MG TABS Take by mouth daily.  ? meloxicam (MOBIC) 15 MG tablet Take 1 tablet (15 mg total) by mouth daily.  ? methylPREDNISolone (MEDROL DOSEPAK) 4 MG TBPK tablet 6 day dose pack - take as directed  ? potassium chloride SA (KLOR-CON) 10 MEQ tablet Take 1 tablet (10 mEq total) by mouth daily.  ? rosuvastatin (CRESTOR) 10 MG tablet Take 1 tablet (10 mg total) by mouth daily.  ? Tiotropium Bromide Monohydrate (SPIRIVA RESPIMAT) 2.5 MCG/ACT AERS Inhale 2 puffs into the lungs daily.  ? valsartan (DIOVAN) 80 MG tablet Take 1 tablet (80 mg total) by mouth daily.  ? ?No facility-administered encounter medications on file as of 03/28/2022.  ? ? ?Allergies (verified) ?Patient has no  known allergies.  ? ?History: ?Past Medical History:  ?Diagnosis Date  ? COPD (chronic obstructive pulmonary disease) ([place]) 01/2019  ? History of cardiac murmur as a child   ? child  ? History of gestational diabetes   ? Hypertension   ? Left bundle branch block   ? 11/2012 low risk, no ischemia, NL EF stress test (Dr. Marlou Porch)  ? Rheumatoid arthritis(714.0)   ? Smoker   ? Thyroid nodule   ? ?Past Surgical History:  ?Procedure Laterality Date  ? ABDOMINAL HYSTERECTOMY  12/19/2012  ? Procedure: HYSTERECTOMY ABDOMINAL;  Surgeon: Maeola Sarah. Landry Mellow, MD;  Location: [facility];  Service: Gynecology;  Laterality: N/A;   Incision @ 5465  ? ANTERIOR CERVICAL DECOMP/DISCECTOMY FUSION N/A 07/17/2013  ? Cervical four-five, Cervical five-six Anterior cervical decompression/diskectomy/fusion;  Surgeon: Floyce Stakes, MD;  Location: [facility];  Service: Neurosurgery;  Laterality: N/A;  Cervical four-five, Cervical five-six Anterior cervical decompression/diskectomy/fusion  ? BILATERAL SALPINGECTOMY  12/19/2012  ? Procedure: BILATERAL SALPINGECTOMY;  Surgeon: Maeola Sarah. Landry Mellow, MD;  Location: [facility];  Service: Gynecology;  Laterality: Bilateral;  ? CARDIAC CATHETERIZATION  09/30/2014  ? Procedure: LEFT HEART CATH AND CORONARY ANGIOGRAPHY;  Surgeon: Laverda Page, MD;  Location: [facility] C[facility];  Service: Cardiovascular;;  ? CESAREAN SECTION    ? COLON SURGERY  1992  ? CYSTOSCOPY  12/19/2012  ? Procedure: CYSTOSCOPY;  Surgeon: Maeola Sarah. Landry Mellow, MD;  Location: [facility];  Service: Gynecology;  Laterality: N/A;  ? ROBOTIC ASSISTED TOTAL HYSTERECTOMY  12/19/2012  ? total hysterectomy for fibroids, ovaries removed [name])  ? ?Family History  ?Problem Relation Age of Onset  ? Stroke Mother   ? CAD Mother   ?     MI  ? Diabetes Sister   ? Hypertension Brother   ? CAD Brother   ?     MI  ? Hypertension Brother   ? Hypertension Sister   ? Breast cancer Sister   ? Cancer Neg Hx   ? ?Social History  ? ?Socioeconomic History  ? Marital status: Married  ?  Spouse name: Not on file  ? Number of children: Not on file  ? Years of education: Not on file  ? Highest education level: Not on file  ?Occupational History  ? Not on file  ?Tobacco Use  ? Smoking status: Every Day  ?  Packs/day: 0.50  ?  Years: 44.00  ?  Pack years: 22.00  ?  Types: Cigarettes  ? Smokeless tobacco: Never  ? Tobacco comments:  ?  smoking 1/2 ppd.  very stressed. - 01/05/2022  ?Vaping Use  ? Vaping Use: Never used  ?Substance and Sexual Activity  ? Alcohol use: Yes  ?  Alcohol/week: 14.0 - 24.0 standard drinks  ?  Types: 14 - 24 Cans of beer per week  ? Drug use: No  ? Sexual activity: Not  on file  ?Other Topics Concern  ? Not on file  ?Social History Narrative  ? Lives with husband Annie Main  ? Occ: retired, was in Science writer  ? Activity:   ? Diet:   ? ?Social Determinants of Health  ? ?Financial Resource Strain: Low Risk   ? Difficulty of Paying Living Expenses: Not hard at all  ?Food Insecurity: No Food Insecurity  ? Worried About Charity fundraiser in the Last Year: Never true  ? Ran Out of Food in the Last Year: Never true  ?Transportation Needs: No Transportation Needs  ? Lack  of Transportation (Medical): No  ? Lack of Transportation (Non-Medical): No  ?Physical Activity: Inactive  ? Days of Exercise per Week: 0 days  ? Minutes of Exercise per Session: 0 min  ?Stress: No Stress Concern Present  ? Feeling of Stress : Not at all  ?Social Connections: Not on file  ? ? ?Tobacco Counseling ?Ready to quit: Yes ?Counseling given: Not Answered ?Tobacco comments: smoking 1/2 ppd.  very stressed. - 01/05/2022 ? ? ?Clinical Intake: ? ?Pre-visit preparation completed: Yes ? ?Pain : No/denies pain ? ?  ? ?Nutritional Status: BMI of 19-24  Normal ?Nutritional Risks: None ?Diabetes: No ? ?How often do you need to have someone help you when you read instructions, pamphlets, or other written materials from your doctor or pharmacy?: 1 - Never ? ?Diabetic? no ? ?Interpreter Needed?: No ? ?Information entered by :: NAllen LPN ? ? ?Activities of Daily Living ? ?  03/28/2022  ?  2:06 PM  ?In your present state of health, do you have any difficulty performing the following activities:  ?Hearing? 0  ?Vision? 0  ?Difficulty concentrating or making decisions? 0  ?Walking or climbing stairs? 1  ?Dressing or bathing? 0  ?Doing errands, shopping? 0  ?Preparing Food and eating ? N  ?Using the Toilet? N  ?In the past six months, have you accidently leaked urine? N  ?Do you have problems with loss of bowel control? N  ?Managing your Medications? N  ?Managing your Finances? N  ?Housekeeping or managing your Housekeeping? N   ? ? ?Patient Care Team: ?Ria Bush, MD as PCP - General (Family Medicine) ?Christophe Louis, MD as Consulting Physician (Obstetrics and Gynecology) ? ?Indicate any recent Medical Services you may have received fro

## 2022-03-29 ENCOUNTER — Other Ambulatory Visit (INDEPENDENT_AMBULATORY_CARE_PROVIDER_SITE_OTHER): Payer: PPO

## 2022-03-29 DIAGNOSIS — N289 Disorder of kidney and ureter, unspecified: Secondary | ICD-10-CM

## 2022-03-29 DIAGNOSIS — M810 Age-related osteoporosis without current pathological fracture: Secondary | ICD-10-CM

## 2022-03-29 DIAGNOSIS — E7800 Pure hypercholesterolemia, unspecified: Secondary | ICD-10-CM | POA: Diagnosis not present

## 2022-03-29 DIAGNOSIS — M069 Rheumatoid arthritis, unspecified: Secondary | ICD-10-CM | POA: Diagnosis not present

## 2022-03-29 DIAGNOSIS — E041 Nontoxic single thyroid nodule: Secondary | ICD-10-CM

## 2022-03-29 LAB — CBC WITH DIFFERENTIAL/PLATELET
Basophils Absolute: 0.1 10*3/uL (ref 0.0–0.1)
Basophils Relative: 1.2 % (ref 0.0–3.0)
Eosinophils Absolute: 0.2 10*3/uL (ref 0.0–0.7)
Eosinophils Relative: 2.4 % (ref 0.0–5.0)
HCT: 36.6 % (ref 36.0–46.0)
Hemoglobin: 12.4 g/dL (ref 12.0–15.0)
Lymphocytes Relative: 24.9 % (ref 12.0–46.0)
Lymphs Abs: 1.8 10*3/uL (ref 0.7–4.0)
MCHC: 33.8 g/dL (ref 30.0–36.0)
MCV: 95.9 fl (ref 78.0–100.0)
Monocytes Absolute: 1.2 10*3/uL — ABNORMAL HIGH (ref 0.1–1.0)
Monocytes Relative: 15.8 % — ABNORMAL HIGH (ref 3.0–12.0)
Neutro Abs: 4.1 10*3/uL (ref 1.4–7.7)
Neutrophils Relative %: 55.7 % (ref 43.0–77.0)
Platelets: 305 10*3/uL (ref 150.0–400.0)
RBC: 3.82 Mil/uL — ABNORMAL LOW (ref 3.87–5.11)
RDW: 14.8 % (ref 11.5–15.5)
WBC: 7.4 10*3/uL (ref 4.0–10.5)

## 2022-03-29 LAB — COMPREHENSIVE METABOLIC PANEL
ALT: 15 U/L (ref 0–35)
AST: 27 U/L (ref 0–37)
Albumin: 4.1 g/dL (ref 3.5–5.2)
Alkaline Phosphatase: 75 U/L (ref 39–117)
BUN: 12 mg/dL (ref 6–23)
CO2: 25 mEq/L (ref 19–32)
Calcium: 9.3 mg/dL (ref 8.4–10.5)
Chloride: 101 mEq/L (ref 96–112)
Creatinine, Ser: 0.82 mg/dL (ref 0.40–1.20)
GFR: 76.21 mL/min (ref >60.00)
Glucose, Bld: 134 mg/dL — ABNORMAL HIGH (ref 70–99)
Potassium: 4.4 mEq/L (ref 3.5–5.1)
Sodium: 135 mEq/L (ref 135–145)
Total Bilirubin: 0.6 mg/dL (ref 0.2–1.2)
Total Protein: 7.1 g/dL (ref 6.0–8.3)

## 2022-03-29 LAB — LIPID PANEL
Cholesterol: 221 mg/dL — ABNORMAL HIGH (ref 0–200)
HDL: 120.2 mg/dL (ref >39.00)
LDL Cholesterol: 86 mg/dL (ref 0–99)
NonHDL: 100.67
Total CHOL/HDL Ratio: 2
Triglycerides: 73 mg/dL (ref 0.0–149.0)
VLDL: 14.6 mg/dL (ref 0.0–40.0)

## 2022-03-29 LAB — VITAMIN D 25 HYDROXY (VIT D DEFICIENCY, FRACTURES): VITD: 69.89 ng/mL (ref 30.00–100.00)

## 2022-03-29 LAB — TSH: TSH: 0.76 u[IU]/mL (ref 0.35–5.50)

## 2022-03-31 NOTE — Telephone Encounter (Signed)
I attempted to call Kim Braun. I left Kim Braun a message to give me a call back on tomorrow.  I informed Kim Braun that Dr. Amalia Hailey wants Kim Braun to stop by and get an air fracture walker / cam boot.  I asked Kim Braun for Kim Braun shoe size. ?

## 2022-04-04 NOTE — Telephone Encounter (Signed)
I'm calling to let you know that Dr. Amalia Hailey said if you are still having the pain, maybe you need to wear an air fracture walker for about 2 to 3 weeks.  "Will my insurance cover it.  I can give you the code for the boot and you can call your insurance company to see if they cover it.  The code is L4360.  "I'm on a fixed income and we're barely making it so I don't know if I will be able to afford it.  I will call my insurance company and see what they say and I'll let you know."  What size shoe do you wear?  "I wear a size 7 / 7.5."  I'll leave the boot at the front desk for you if your insurance will cover it. ?

## 2022-04-05 ENCOUNTER — Ambulatory Visit (INDEPENDENT_AMBULATORY_CARE_PROVIDER_SITE_OTHER): Payer: PPO | Admitting: Family Medicine

## 2022-04-05 ENCOUNTER — Encounter: Payer: Self-pay | Admitting: Family Medicine

## 2022-04-05 VITALS — BP 136/84 | HR 85 | Temp 98.3°F | Ht 62.75 in | Wt 112.0 lb

## 2022-04-05 DIAGNOSIS — E7800 Pure hypercholesterolemia, unspecified: Secondary | ICD-10-CM

## 2022-04-05 DIAGNOSIS — F17200 Nicotine dependence, unspecified, uncomplicated: Secondary | ICD-10-CM

## 2022-04-05 DIAGNOSIS — M069 Rheumatoid arthritis, unspecified: Secondary | ICD-10-CM | POA: Diagnosis not present

## 2022-04-05 DIAGNOSIS — E041 Nontoxic single thyroid nodule: Secondary | ICD-10-CM | POA: Diagnosis not present

## 2022-04-05 DIAGNOSIS — Z1211 Encounter for screening for malignant neoplasm of colon: Secondary | ICD-10-CM | POA: Diagnosis not present

## 2022-04-05 DIAGNOSIS — I10 Essential (primary) hypertension: Secondary | ICD-10-CM | POA: Diagnosis not present

## 2022-04-05 DIAGNOSIS — Z7189 Other specified counseling: Secondary | ICD-10-CM | POA: Diagnosis not present

## 2022-04-05 DIAGNOSIS — M810 Age-related osteoporosis without current pathological fracture: Secondary | ICD-10-CM | POA: Diagnosis not present

## 2022-04-05 DIAGNOSIS — Z8349 Family history of other endocrine, nutritional and metabolic diseases: Secondary | ICD-10-CM | POA: Diagnosis not present

## 2022-04-05 DIAGNOSIS — J432 Centrilobular emphysema: Secondary | ICD-10-CM | POA: Diagnosis not present

## 2022-04-05 DIAGNOSIS — Z0000 Encounter for general adult medical examination without abnormal findings: Secondary | ICD-10-CM

## 2022-04-05 DIAGNOSIS — I700 Atherosclerosis of aorta: Secondary | ICD-10-CM | POA: Diagnosis not present

## 2022-04-05 MED ORDER — POTASSIUM CHLORIDE CRYS ER 10 MEQ PO TBCR: 10.0000 meq | EXTENDED_RELEASE_TABLET | Freq: Every day | ORAL | 3 refills | Status: DC

## 2022-04-05 MED ORDER — SPIRIVA RESPIMAT 2.5 MCG/ACT IN AERS: 2.0000 | INHALATION_SPRAY | Freq: Every day | RESPIRATORY_TRACT | 6 refills | Status: DC

## 2022-04-05 MED ORDER — VALSARTAN 80 MG PO TABS
80.0000 mg | ORAL_TABLET | Freq: Every day | ORAL | 3 refills | Status: DC
Start: 2022-04-05 — End: 2023-03-23

## 2022-04-05 NOTE — Assessment & Plan Note (Signed)
Appreciate pulm care. ?She's not been using spiriva or nocturnal O2 - advised restart this.  ?Encouraged full smoking cessation.  ?

## 2022-04-05 NOTE — Assessment & Plan Note (Signed)
Chronic, adequate on current regimen. BP better controlled on recheck.  ?

## 2022-04-05 NOTE — Patient Instructions (Addendum)
Pass by lab to pick up stool kit.  ?If interested, check with pharmacy about new 2 shot shingles series (shingrix).  ?Start monitoring blood pressures at home and let us know if consistently >140/90.  ?Restart spiriva.  ?Call to schedule mammogram at your convenience: [place] [phone] ?Work on living will. Packet provided today.  ?Good to see you today ?Return as needed or in 1 year for next physical/wellness visit.  ? ?Health Maintenance for Postmenopausal Women ?Menopause is a normal process in which your ability to get pregnant comes to an end. This process happens slowly over many months or years, usually between the ages of 79 and 27. Menopause is complete when you have missed your menstrual period for 12 months. ?It is important to talk with your health care provider about some of the most common conditions that affect women after menopause (postmenopausal women). These include heart disease, cancer, and bone loss (osteoporosis). Adopting a healthy lifestyle and getting preventive care can help to promote your health and wellness. The actions you take can also lower your chances of developing some of these common conditions. ?What are the signs and symptoms of menopause? ?During menopause, you may have the following symptoms: ?Hot flashes. These can be moderate or severe. ?Night sweats. ?Decrease in sex drive. ?Mood swings. ?Headaches. ?Tiredness (fatigue). ?Irritability. ?Memory problems. ?Problems falling asleep or staying asleep. ?Talk with your health care provider about treatment options for your symptoms. ?Do I need hormone replacement therapy? ?Hormone replacement therapy is effective in treating symptoms that are caused by menopause, such as hot flashes and night sweats. ?Hormone replacement carries certain risks, especially as you become older. If you are thinking about using estrogen or estrogen with progestin, discuss the benefits and risks with your health care  provider. ?How can I reduce my risk for heart disease and stroke? ?The risk of heart disease, heart attack, and stroke increases as you age. One of the causes may be a change in the body's hormones during menopause. This can affect how your body uses dietary fats, triglycerides, and cholesterol. Heart attack and stroke are medical emergencies. There are many things that you can do to help prevent heart disease and stroke. ?Watch your blood pressure ?High blood pressure causes heart disease and increases the risk of stroke. This is more likely to develop in people who have high blood pressure readings or are overweight. ?Have your blood pressure checked: ?Every 3-5 years if you are 27-23 years of age. ?Every year if you are 51 years old or older. ?Eat a healthy diet ? ?Eat a diet that includes plenty of vegetables, fruits, low-fat dairy products, and lean protein. ?Do not eat a lot of foods that are high in solid fats, added sugars, or sodium. ?Get regular exercise ?Get regular exercise. This is one of the most important things you can do for your health. Most adults should: ?Try to exercise for at least 150 minutes each week. The exercise should increase your heart rate and make you sweat (moderate-intensity exercise). ?Try to do strengthening exercises at least twice each week. Do these in addition to the moderate-intensity exercise. ?Spend less time sitting. Even light physical activity can be beneficial. ?Other tips ?Work with your health care provider to achieve or maintain a healthy weight. ?Do not use any products that contain nicotine or tobacco. These products include cigarettes, chewing tobacco, and vaping devices, such as e-cigarettes. If you need help quitting, ask your health care provider. ?Know your numbers.  Ask your health care provider to check your cholesterol and your blood sugar (glucose). Continue to have your blood tested as directed by your health care provider. ?Do I need screening for  cancer? ?Depending on your health history and family history, you may need to have cancer screenings at different stages of your life. This may include screening for: ?Breast cancer. ?Cervical cancer. ?Lung cancer. ?Colorectal cancer. ?What is my risk for osteoporosis? ?After menopause, you may be at increased risk for osteoporosis. Osteoporosis is a condition in which bone destruction happens more quickly than new bone creation. To help prevent osteoporosis or the bone fractures that can happen because of osteoporosis, you may take the following actions: ?If you are 62-54 years old, get at least 1,000 mg of calcium and at least 600 international units (IU) of vitamin D per day. ?If you are older than age 23 but younger than age 29, get at least 1,200 mg of calcium and at least 600 international units (IU) of vitamin D per day. ?If you are older than age 18, get at least 1,200 mg of calcium and at least 800 international units (IU) of vitamin D per day. ?Smoking and drinking excessive alcohol increase the risk of osteoporosis. Eat foods that are rich in calcium and vitamin D, and do weight-bearing exercises several times each week as directed by your health care provider. ?How does menopause affect my mental health? ?Depression may occur at any age, but it is more common as you become older. Common symptoms of depression include: ?Feeling depressed. ?Changes in sleep patterns. ?Changes in appetite or eating patterns. ?Feeling an overall lack of motivation or enjoyment of activities that you previously enjoyed. ?Frequent crying spells. ?Talk with your health care provider if you think that you are experiencing any of these symptoms. ?General instructions ?See your health care provider for regular wellness exams and vaccines. This may include: ?Scheduling regular health, dental, and eye exams. ?Getting and maintaining your vaccines. These include: ?Influenza vaccine. Get this vaccine each year before the flu season  begins. ?Pneumonia vaccine. ?Shingles vaccine. ?Tetanus, diphtheria, and pertussis (Tdap) booster vaccine. ?Your health care provider may also recommend other immunizations. ?Tell your health care provider if you have ever been abused or do not feel safe at home. ?Summary ?Menopause is a normal process in which your ability to get pregnant comes to an end. ?This condition causes hot flashes, night sweats, decreased interest in sex, mood swings, headaches, or lack of sleep. ?Treatment for this condition may include hormone replacement therapy. ?Take actions to keep yourself healthy, including exercising regularly, eating a healthy diet, watching your weight, and checking your blood pressure and blood sugar levels. ?Get screened for cancer and depression. Make sure that you are up to date with all your vaccines. ?This information is not intended to replace advice given to you by your health care provider. Make sure you discuss any questions you have with your health care provider. ?Document Revised: 04/05/2021 Document Reviewed: 04/05/2021 ?Elsevier Patient Education ? [place]. ? ?

## 2022-04-05 NOTE — Assessment & Plan Note (Addendum)
Chronic, stable on crestor '10mg'$  daily, recently started by cardiology. Hyperlipidemia driven by high HDL.  ?The ASCVD Risk score (Arnett DK, et al., 2019) failed to calculate for the following reasons: ?  The valid HDL cholesterol range is 20 to 100 mg/dL  ?

## 2022-04-05 NOTE — Assessment & Plan Note (Signed)
Continue aspirin, statin.  

## 2022-04-05 NOTE — Assessment & Plan Note (Signed)
Encouraged full cessation. ?She is undergoing lung cancer screening CT scans.  ?

## 2022-04-05 NOTE — Assessment & Plan Note (Signed)
Son newly diagnosed. Check iron panel next labwork.  ?

## 2022-04-05 NOTE — Assessment & Plan Note (Signed)
Preventative protocols reviewed and updated unless pt declined. Discussed healthy diet and lifestyle.  

## 2022-04-05 NOTE — Assessment & Plan Note (Signed)
On Enbrel through rheumatologist.  ?

## 2022-04-05 NOTE — Progress Notes (Signed)
? ? Patient ID: Kim Braun, female    DOB: [birth year], 61 y.o.   MRN: [id number] ? ?This visit was conducted in person. ? ?BP 136/84 (BP Location: Right Arm, Cuff Size: Normal)   Pulse 85   Temp 98.3 ?F (36.8 ?C) (Temporal)   Ht 5' 2.75" (1.594 m)   Wt 112 lb (50.8 kg)   LMP 12/03/2012   SpO2 98%   BMI 20.00 kg/m?   ? ?CC: CPE ?Subjective:  ? ?HPI: ?Kim Braun is a 61 y.o. female presenting on 04/05/2022 for Annual Exam [facility] prt 2. ) ? ? ?Saw health advisor last week for medicare wellness visit. Note reviewed.   ? ?No results found.  ?Flowsheet Row Clinical Support from 03/28/2022 in [facility]  ?PHQ-2 Total Score 0  ? ?  ?  ? ?  03/28/2022  ?  2:05 PM 03/29/2021  ? 10:17 AM 03/26/2021  ?  2:24 PM  ?Fall Risk   ?Falls in the past year? 0 0 0  ?Number falls in past yr: 0  0  ?Injury with Fall? 0  0  ?Risk for fall due to : Medication side effect  Medication side effect  ?Follow up Falls evaluation completed;Education provided;Falls prevention discussed  Falls evaluation completed;Falls prevention discussed  ? ?Known rheumatoid arthritis on Humira - rheumatologist is Dr Kathlene November.  ? ?Has been seeing for podiatry plantar fasciitis.  ?  ?OP - on fosamax '70mg'$  weekly. Last DEXA 07/2020 through rheum. Also takes calcium/vit D + vit D. Lactose intolerant - avoids dairy.  ?  ?COPD - followed by pulm Dr Mortimer Fries. Hasn't been taking spiriva or using oxygen at night time. She didn't like spiriva capsule.  ? ?CAD - heavy 3v coronary calcification on CT scan - saw cardiology 12/2021 s/p normal stress test 12/2021.   ? ?Son recently found to have hemochromatosis.  ? ?Preventative: ?Colon cancer screening - discussed options - would like ifob ?[facility] ?Well woman exam - s/p total hysterectomy (Dr Landry Mellow), ovaries removed as well.  ?DEXA scan 07/2020 through Rheum on fosamax - planned rpt next visit.  ?Lung cancer screening - yearly, first 12/2020  ?Flu shot - yearly ?[place] 01/2020, 02/2020, bivalent 09/2021 ?Td 2006  ?Pneumovax 2014 ?Shingrix - discussed. To check with pharmacy.  ?Advanced directive discussion - doesn't have this at home. Husband Kim Braun would be HCPOA. Doesn't want prolonged life support if terminal condition. Packet provided previously and again today.  ?Seat belt use discussed ?Sunscreen use discussed. No changing moles on skin. Saw derm. S/p skin cancer removed from bottom lip several years ago.  ?Smoker - 1/2 ppd, contemplative  ?Alcohol - beer/wine 10 drinks/wk ?Dentist - yearly @ [facility] ?Eye exam - yearly ?Bowel - no constipation  ?Bladder - no incontinence  ? ?Lives with husband Kim Braun ?Occ: retired, was in [employer] ?   ? ?Relevant past medical, surgical, family and social history reviewed and updated as indicated. Interim medical history since our last visit reviewed. ?Allergies and medications reviewed and updated. ?Outpatient Medications Prior to Visit  ?Medication Sig Dispense Refill  ? albuterol (PROVENTIL HFA;VENTOLIN HFA) 108 (90 BASE) MCG/ACT inhaler Inhale 2 puffs into the lungs daily as needed for wheezing or shortness of breath.    ? alendronate (FOSAMAX) 70 MG tablet Take 70 mg by mouth once a week.    ? aspirin EC 81 MG tablet Take 1 tablet (81 mg total) by mouth  daily. Swallow whole. 90 tablet 3  ? betamethasone dipropionate 0.05 % cream Apply topically 2 (two) times daily.    ? Biotin 10 MG TABS 1 tablet    ? CALCIUM-VITAMIN D PO Take by mouth daily. Calcium 600 mg + vit D3    ? Cholecalciferol (VITAMIN D3 PO) Take 2,000 Units by mouth.    ? Etanercept (ENBREL MINI) 50 MG/ML SOCT Inject into the skin.    ? L-Lysine 500 MG TABS Take by mouth daily.    ? meloxicam (MOBIC) 15 MG tablet Take 1 tablet (15 mg total) by mouth daily. 30 tablet 1  ? methylPREDNISolone (MEDROL DOSEPAK) 4 MG TBPK tablet 6 day dose pack - take as directed 21 tablet 0  ? rosuvastatin (CRESTOR) 10 MG tablet Take 1 tablet (10 mg total) by mouth daily. 90 tablet 3  ?  potassium chloride SA (KLOR-CON) 10 MEQ tablet Take 1 tablet (10 mEq total) by mouth daily. 90 tablet 3  ? Tiotropium Bromide Monohydrate (SPIRIVA RESPIMAT) 2.5 MCG/ACT AERS Inhale 2 puffs into the lungs daily. 4 g 2  ? valsartan (DIOVAN) 80 MG tablet Take 1 tablet (80 mg total) by mouth daily. 30 tablet 11  ? ?No facility-administered medications prior to visit.  ?  ? ?Per HPI unless specifically indicated in ROS section below ?Review of Systems  ?Constitutional:  Negative for activity change, appetite change, chills, fatigue, fever and unexpected weight change.  ?HENT:  Negative for hearing loss.   ?Eyes:  Negative for visual disturbance.  ?Respiratory:  Negative for cough, chest tightness, shortness of breath and wheezing.   ?Cardiovascular:  Negative for chest pain, palpitations and leg swelling.  ?Gastrointestinal:  Negative for abdominal distention, abdominal pain, blood in stool, constipation, diarrhea, nausea and vomiting.  ?Genitourinary:  Negative for difficulty urinating and hematuria.  ?Musculoskeletal:  Negative for arthralgias, myalgias and neck pain.  ?Skin:  Negative for rash.  ?Neurological:  Negative for dizziness, seizures, syncope and headaches.  ?Hematological:  Negative for adenopathy. Bruises/bleeds easily.  ?Psychiatric/Behavioral:  Negative for dysphoric mood. The patient is not nervous/anxious.   ? ?Objective:  ?BP 136/84 (BP Location: Right Arm, Cuff Size: Normal)   Pulse 85   Temp 98.3 ?F (36.8 ?C) (Temporal)   Ht 5' 2.75" (1.594 m)   Wt 112 lb (50.8 kg)   LMP 12/03/2012   SpO2 98%   BMI 20.00 kg/m?   ?Wt Readings from Last 3 Encounters:  ?04/05/22 112 lb (50.8 kg)  ?03/28/22 110 lb (49.9 kg)  ?01/05/22 111 lb 9.6 oz (50.6 kg)  ?  ?  ?Physical Exam ?Vitals and nursing note reviewed.  ?Constitutional:   ?   Appearance: Normal appearance. She is not ill-appearing.  ?HENT:  ?   Head: Normocephalic and atraumatic.  ?   Right Ear: Tympanic membrane, ear canal and external ear normal.  There is no impacted cerumen.  ?   Left Ear: Tympanic membrane, ear canal and external ear normal. There is no impacted cerumen.  ?Eyes:  ?   General:     ?   Right eye: No discharge.     ?   Left eye: No discharge.  ?   Extraocular Movements: Extraocular movements intact.  ?   Conjunctiva/sclera: Conjunctivae normal.  ?   Pupils: Pupils are equal, round, and reactive to light.  ?Neck:  ?   Thyroid: No thyroid mass or thyromegaly.  ?   Vascular: No carotid bruit.  ?Cardiovascular:  ?   Rate and  Rhythm: Normal rate and regular rhythm.  ?   Pulses: Normal pulses.  ?   Heart sounds: Normal heart sounds. No murmur heard. ?Pulmonary:  ?   Effort: Pulmonary effort is normal. No respiratory distress.  ?   Breath sounds: Normal breath sounds. No wheezing, rhonchi or rales.  ?Abdominal:  ?   General: Bowel sounds are normal. There is no distension.  ?   Palpations: Abdomen is soft. There is no mass.  ?   Tenderness: There is no abdominal tenderness. There is no guarding or rebound.  ?   Hernia: No hernia is present.  ?Musculoskeletal:  ?   Cervical back: Normal range of motion and neck supple. No rigidity.  ?   Right lower leg: No edema.  ?   Left lower leg: No edema.  ?Lymphadenopathy:  ?   Cervical: No cervical adenopathy.  ?Skin: ?   General: Skin is warm and dry.  ?   Findings: No rash.  ?Neurological:  ?   General: No focal deficit present.  ?   Mental Status: She is alert. Mental status is at baseline.  ?Psychiatric:     ?   Mood and Affect: Mood normal.     ?   Behavior: Behavior normal.  ? ?   ?Results for orders placed or performed in visit on 03/29/22  ?VITAMIN D 25 Hydroxy (Vit-D Deficiency, Fractures)  ?Result Value Ref Range  ? VITD 69.89 30.00 - 100.00 ng/mL  ?CBC with Differential/Platelet  ?Result Value Ref Range  ? WBC 7.4 4.0 - 10.5 K/uL  ? RBC 3.82 (L) 3.87 - 5.11 Mil/uL  ? Hemoglobin 12.4 12.0 - 15.0 g/dL  ? HCT 36.6 36.0 - 46.0 %  ? MCV 95.9 78.0 - 100.0 fl  ? MCHC 33.8 30.0 - 36.0 g/dL  ? RDW 14.8 11.5  - 15.5 %  ? Platelets 305.0 150.0 - 400.0 K/uL  ? Neutrophils Relative % 55.7 43.0 - 77.0 %  ? Lymphocytes Relative 24.9 12.0 - 46.0 %  ? Monocytes Relative 15.8 (H) 3.0 - 12.0 %  ? Eosinophils Relative 2.4 0.0 - 5.0

## 2022-04-05 NOTE — Assessment & Plan Note (Signed)
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

## 2022-04-05 NOTE — Assessment & Plan Note (Signed)
This is followed by rheum on fosamax.  ?

## 2022-04-05 NOTE — Assessment & Plan Note (Signed)
Advanced directive discussion - doesn't have this at home. Husband Annie Main would be HCPOA. Doesn't want prolonged life support if terminal condition. Packet provided previously and again today.  ?

## 2022-04-12 DIAGNOSIS — M810 Age-related osteoporosis without current pathological fracture: Secondary | ICD-10-CM | POA: Diagnosis not present

## 2022-04-12 DIAGNOSIS — M1990 Unspecified osteoarthritis, unspecified site: Secondary | ICD-10-CM | POA: Diagnosis not present

## 2022-04-12 DIAGNOSIS — M79643 Pain in unspecified hand: Secondary | ICD-10-CM | POA: Diagnosis not present

## 2022-04-12 DIAGNOSIS — Z79899 Other long term (current) drug therapy: Secondary | ICD-10-CM | POA: Diagnosis not present

## 2022-04-12 DIAGNOSIS — M0579 Rheumatoid arthritis with rheumatoid factor of multiple sites without organ or systems involvement: Secondary | ICD-10-CM | POA: Diagnosis not present

## 2022-04-12 DIAGNOSIS — M797 Fibromyalgia: Secondary | ICD-10-CM | POA: Diagnosis not present

## 2022-04-13 ENCOUNTER — Other Ambulatory Visit (INDEPENDENT_AMBULATORY_CARE_PROVIDER_SITE_OTHER): Payer: PPO

## 2022-04-13 DIAGNOSIS — Z1211 Encounter for screening for malignant neoplasm of colon: Secondary | ICD-10-CM

## 2022-04-14 DIAGNOSIS — J449 Chronic obstructive pulmonary disease, unspecified: Secondary | ICD-10-CM | POA: Diagnosis not present

## 2022-04-18 LAB — FECAL OCCULT BLOOD, IMMUNOCHEMICAL: Fecal Occult Bld: NEGATIVE

## 2022-04-22 ENCOUNTER — Encounter: Payer: Self-pay | Admitting: Podiatry

## 2022-04-22 ENCOUNTER — Ambulatory Visit: Payer: PPO | Admitting: Podiatry

## 2022-04-22 DIAGNOSIS — M722 Plantar fascial fibromatosis: Secondary | ICD-10-CM

## 2022-04-22 NOTE — Progress Notes (Signed)
Subjective: 61 y.o. female presenting today for follow-up evaluation of plantar fasciitis to the right foot.  Patient states that she continues to have pain and tenderness to the right heel despite multiple conservative modalities.  She says the injections did not necessarily help.  She presents for further treatment and evaluation  Past Medical History:  Diagnosis Date   COPD (chronic obstructive pulmonary disease) ([place]) 01/2019   History of cardiac murmur as a child    child   History of gestational diabetes    Hypertension    Left bundle branch block    11/2012 low risk, no ischemia, NL EF stress test (Dr. Marlou Porch)   Rheumatoid arthritis(714.0)    Smoker    Thyroid nodule    Past Surgical History:  Procedure Laterality Date   ABDOMINAL HYSTERECTOMY  12/19/2012   Procedure: HYSTERECTOMY ABDOMINAL;  Surgeon: Maeola Sarah. Landry Mellow, MD;  Location: [facility];  Service: Gynecology;  Laterality: N/A;  Incision @ 1517   ANTERIOR CERVICAL DECOMP/DISCECTOMY FUSION N/A 07/17/2013   Cervical four-five, Cervical five-six Anterior cervical decompression/diskectomy/fusion;  Surgeon: Floyce Stakes, MD;  Location: [facility];  Service: Neurosurgery;  Laterality: N/A;  Cervical four-five, Cervical five-six Anterior cervical decompression/diskectomy/fusion   BILATERAL SALPINGECTOMY  12/19/2012   Procedure: BILATERAL SALPINGECTOMY;  Surgeon: Maeola Sarah. Landry Mellow, MD;  Location: [facility];  Service: Gynecology;  Laterality: Bilateral;   CARDIAC CATHETERIZATION  09/30/2014   Procedure: LEFT HEART CATH AND CORONARY ANGIOGRAPHY;  Surgeon: Laverda Page, MD;  Location: [facility];  Service: Cardiovascular;;   CESAREAN SECTION     COLON SURGERY  1992   CYSTOSCOPY  12/19/2012   Procedure: CYSTOSCOPY;  Surgeon: Maeola Sarah. Landry Mellow, MD;  Location: [facility];  Service: Gynecology;  Laterality: N/A;   ROBOTIC ASSISTED TOTAL HYSTERECTOMY  12/19/2012   total hysterectomy for fibroids, ovaries removed [name])   No Known  Allergies   Objective: Physical Exam General: The patient is alert and oriented x3 in no acute distress.  Dermatology: Skin is warm, dry and supple bilateral lower extremities. Negative for open lesions or macerations bilateral.   Vascular: Dorsalis Pedis and Posterior Tibial pulses palpable bilateral.  Capillary fill time is immediate to all digits.  Neurological: Epicritic and protective threshold intact bilateral.   Musculoskeletal: There continues to be pain to palpation to the plantar aspect of the right heel along the plantar fascia. All other joints range of motion within normal limits bilateral. Strength 5/5 in all groups bilateral.   Assessment: 1. Plantar fasciitis right  Plan of Care:  1. Patient evaluated.  Today we did lightly discuss surgery since the patient has now had this plantar fascial pain for over 1 year now.  She states that she will think about the surgery 2.  Patient states that the injections do not help significantly 3.  Continue meloxicam 15 mg daily as needed.  Refill provided 4.  Cam boot dispensed.  Weightbearing as tolerated x3-4 weeks 5.  Continue therapy modalities at home including deep tissue massage, ice/frozen water bottle, and stretching exercises 6.  Conservatively we have also tried OTC power step insoles, plantar fascial bracing, night splint with no significant alleviation of symptoms.   7.  Return to clinic as needed or if she would like to proceed with surgery   Edrick Kins, DPM [facility]  Dr. Edrick Kins, DPM    [address]  [place], [place] [address]                Office [phone]  Fax [phone]

## 2022-04-25 IMAGING — CT CT ANGIO CHEST
2 of 6 series · 18 of 36 positions shown · IV contrast (omnipaque)
Comparison: Same day chest radiograph, CT chest 01/06/2021

CLINICAL DATA: Left-sided chest pain, shortness of breath with
exertion

EXAM:
CT ANGIOGRAPHY CHEST WITH CONTRAST
TECHNIQUE: Multidetector CT imaging of the chest was performed using the
standard protocol during bolus administration of intravenous
contrast. Multiplanar CT image reconstructions and MIPs were
obtained to evaluate the vascular anatomy.
CONTRAST:  55mL OMNIPAQUE IOHEXOL 350 MG/ML SOLN

[Series 7: pe thins · axial · 0.61mm/px · z∈[+1132,+1394]mm · 17 of 415 slices shown]
[im 21/415  lung]
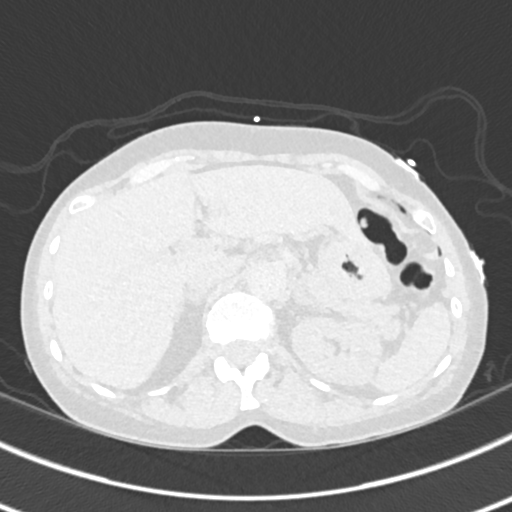
[im 42/415  mediastinal]
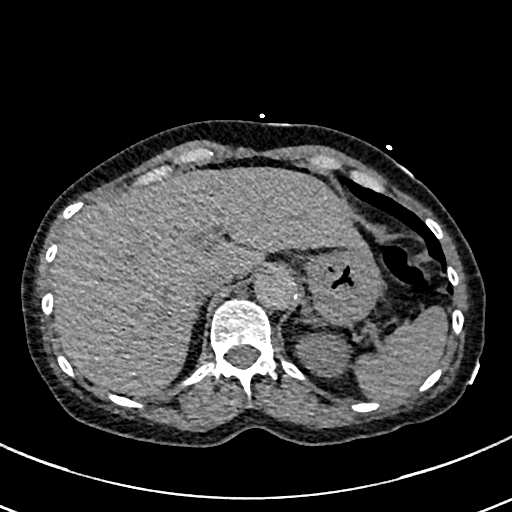
[im 63/415  lung]
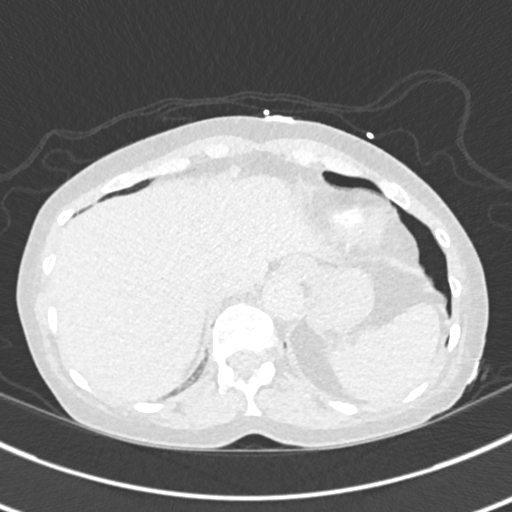
[im 83/415  mediastinal]
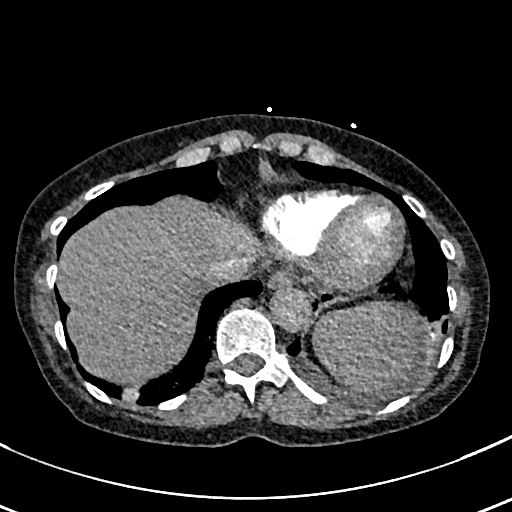
[im 125/415  lung]
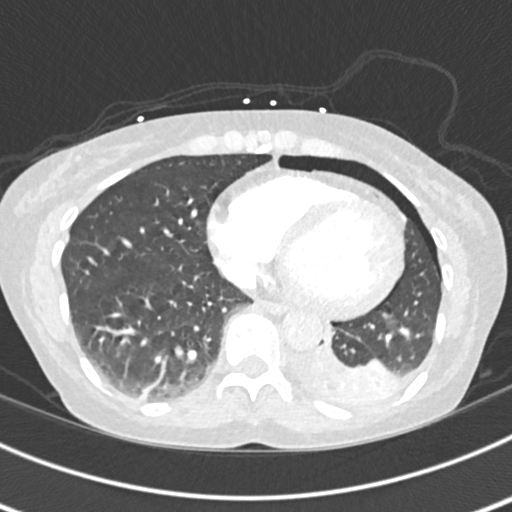
[im 145/415  mediastinal]
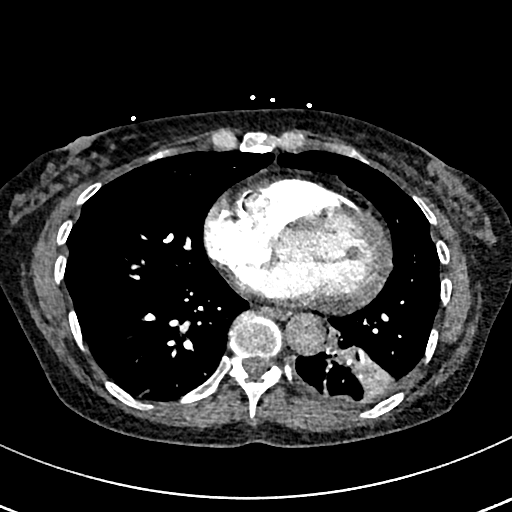
[im 166/415  lung]
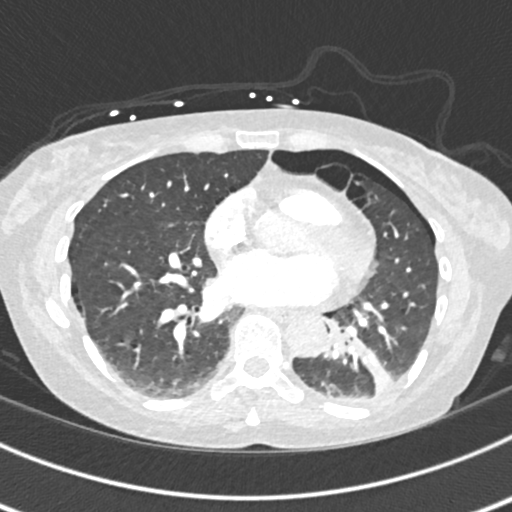
[im 187/415  mediastinal]
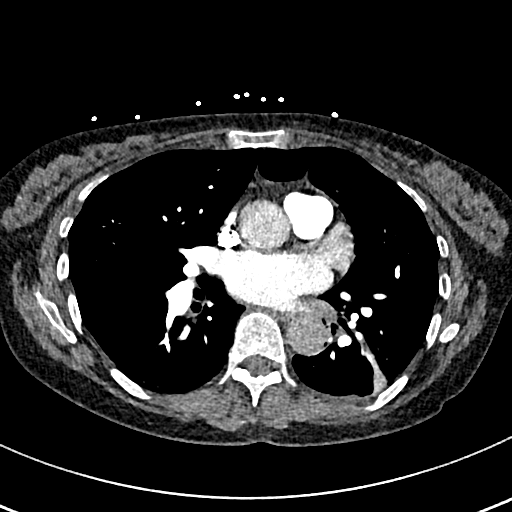
[im 208/415  lung]
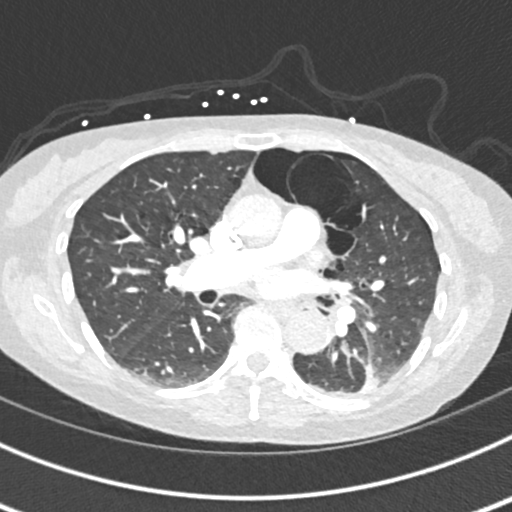
[im 228/415  mediastinal]
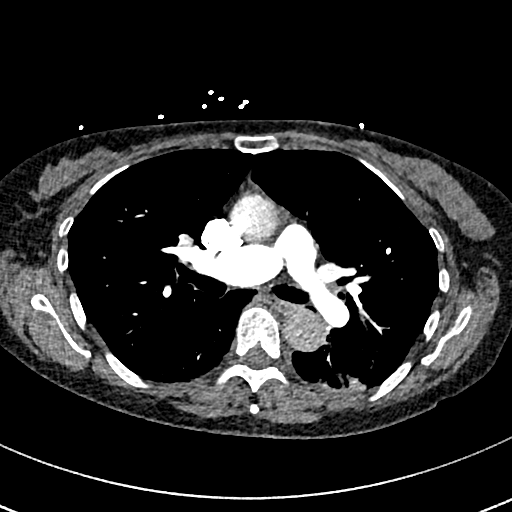
[im 249/415  lung]
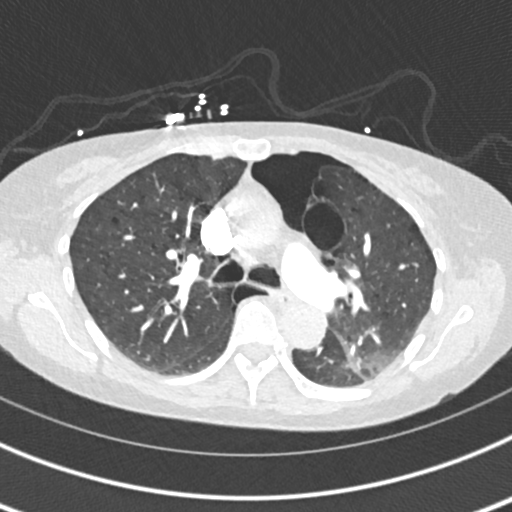
[im 270/415  mediastinal]
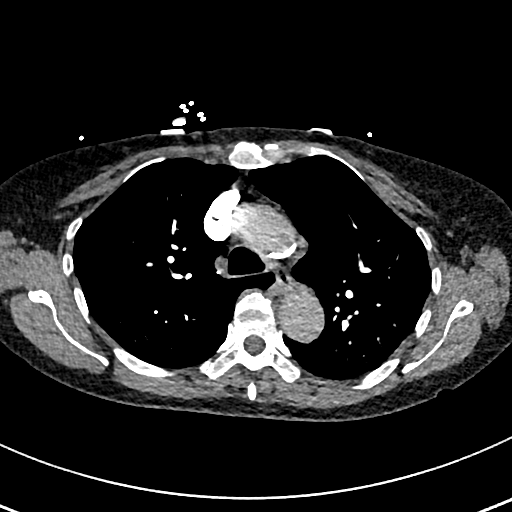
[im 290/415  lung]
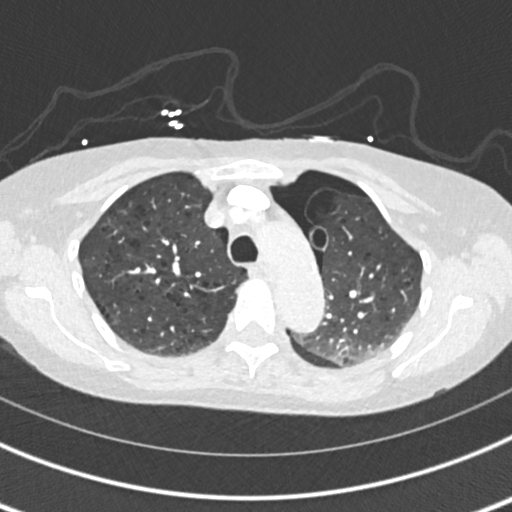
[im 332/415  mediastinal]
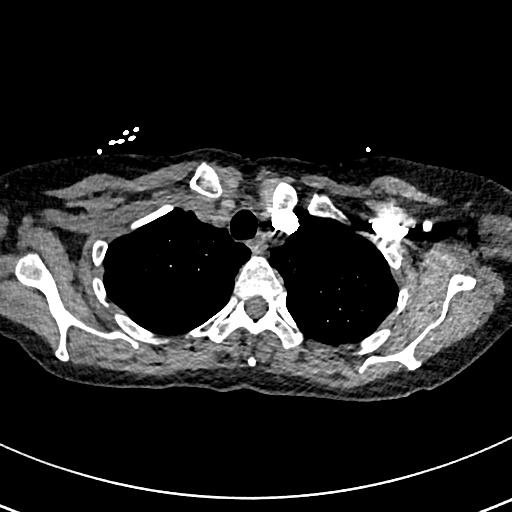
[im 352/415  lung]
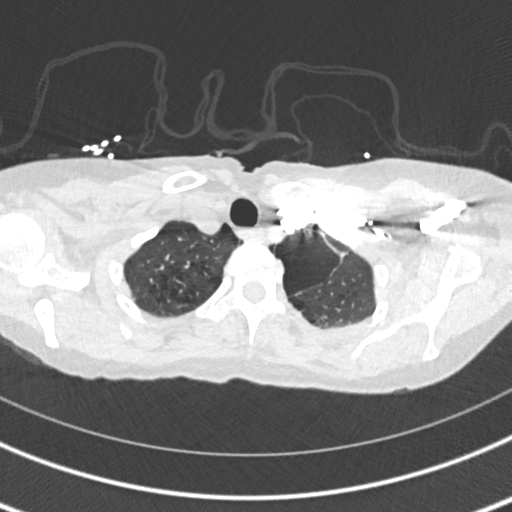
[im 373/415  mediastinal]
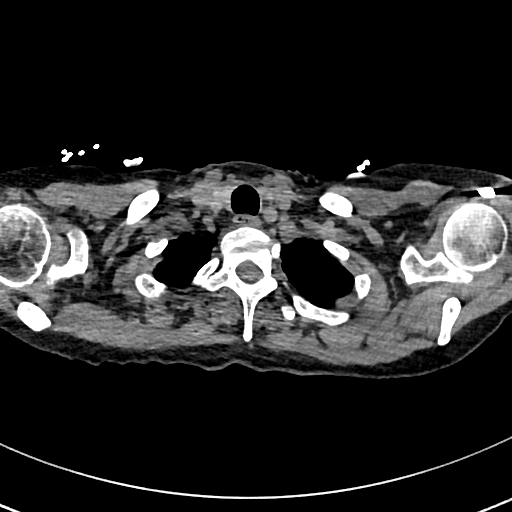
[im 394/415  lung]
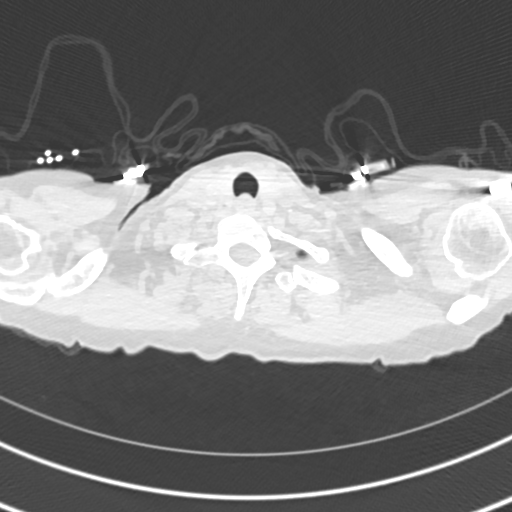

[Series 8: pe 2mm cor · coronal · 0.60mm/px · 1 of 99 slices shown]
[im 50/99  mediastinal]
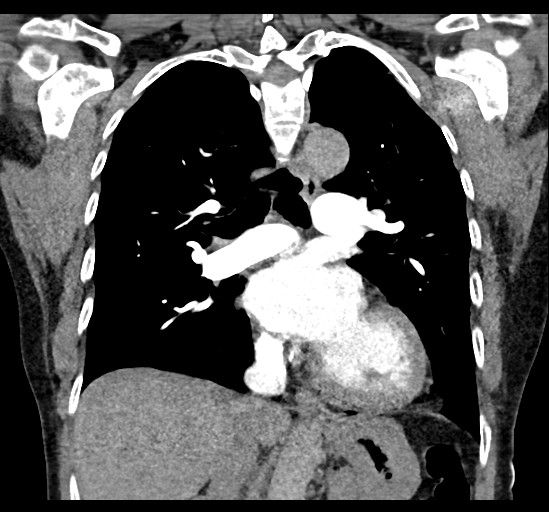

[18 of 36 positions shown; findings below may reference images not displayed]

FINDINGS: Cardiovascular: There is adequate opacification of the pulmonary
arteries to the segmental level. There is no evidence of pulmonary
embolism. The heart is not enlarged. There is no pericardial
effusion. There is mild calcified atherosclerotic plaque of the
aortic arch.

Mediastinum/Nodes: The thyroid is unremarkable. The esophagus is
grossly unremarkable. There is no mediastinal, hilar, or axillary
lymphadenopathy.

Lungs/Pleura: The trachea and central airways are patent. There is
central bronchial wall thickening bilaterally, worse on the left.

There is a small left hydropneumothorax, as seen on prior
radiograph. There is consolidation of the left lower lobe with
patchy enhancement. Linear opacity in the right lung base likely
reflects atelectasis.

There is centrilobular and paraseptal emphysema in the lung apices
with large left upper lobe bullae.

Upper Abdomen: The imaged portions of the upper abdominal viscera
are unremarkable.

Musculoskeletal: There is no acute osseous abnormality or aggressive
osseous lesion.

Review of the MIP images confirms the above findings.
IMPRESSION: 1. No evidence of pulmonary embolism.
2. Small left hydropneumothorax.
3. Consolidation in the left base is suspicious for pneumonia.
4. Background of centrilobular and paraseptal emphysema with large
left upper lobe bullae.

Aortic Atherosclerosis (Z0MWU-OJB.B) and Emphysema (Z0MWU-50B.3).

## 2022-04-25 IMAGING — CR DG CHEST 2V
2 series · 2 of 2 positions shown · non-contrast
Comparison: 02/12/2019 chest radiograph

CLINICAL DATA: Chest pain and dry cough

EXAM:
CHEST - 2 VIEW

[chest pa]
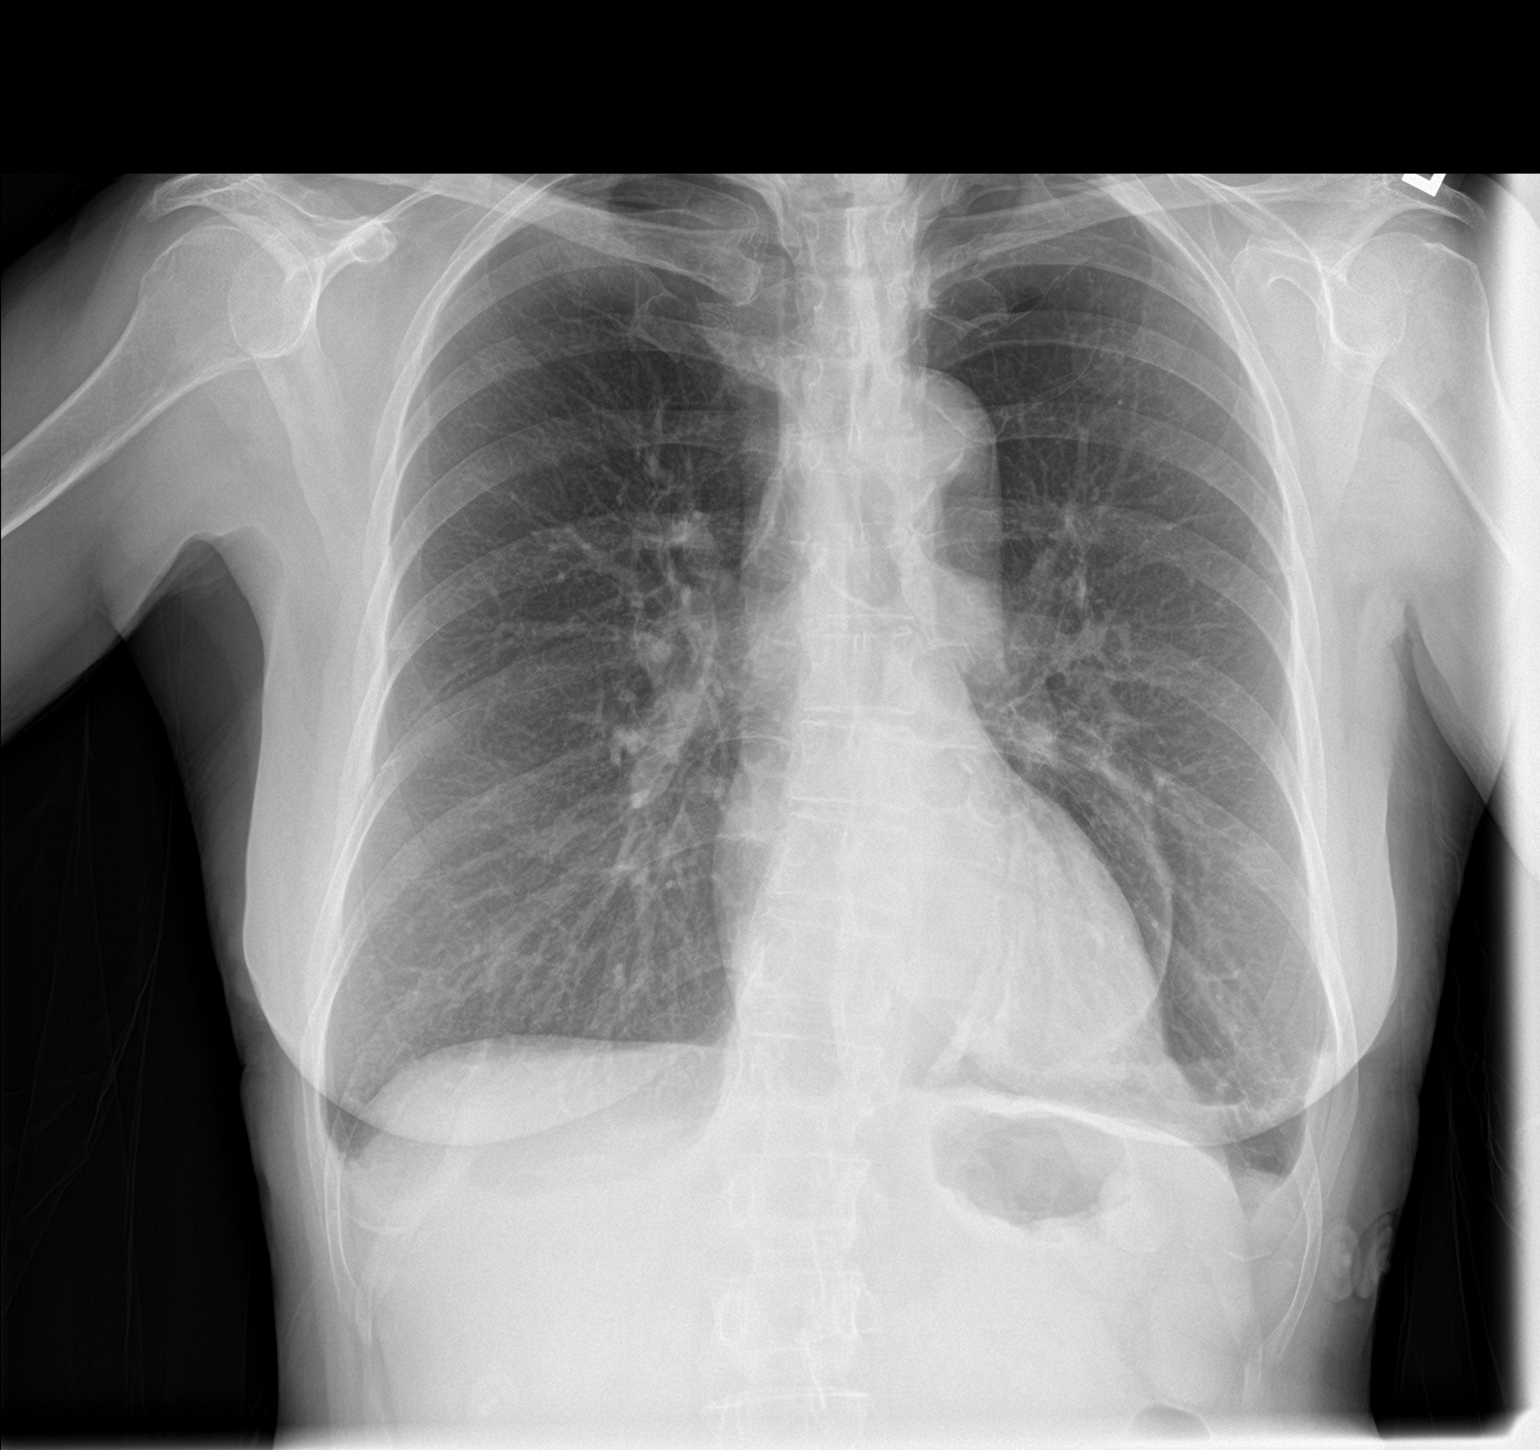

[chest lat]
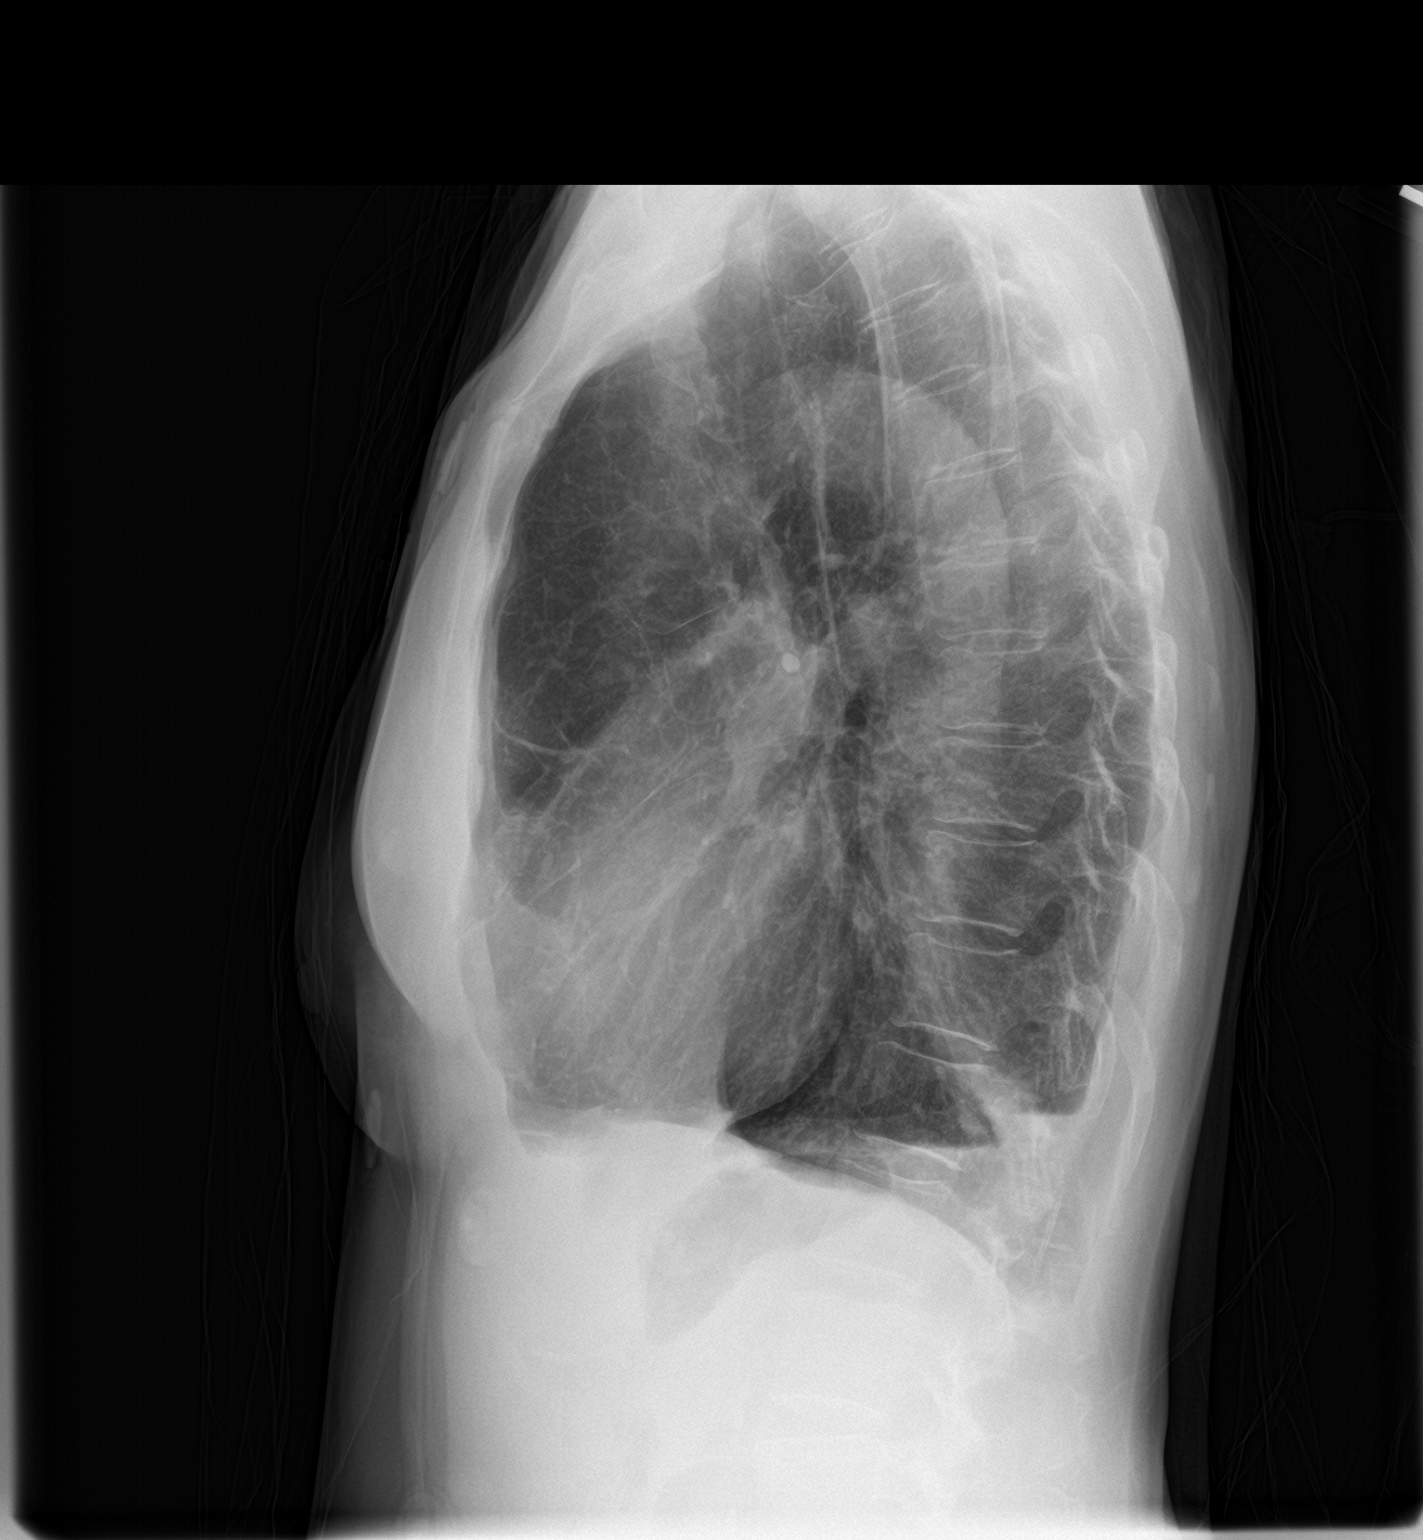

[2 of 2 positions shown; findings below may reference images not displayed]

FINDINGS: Small left hydropneumothorax. Paraseptal emphysema with left apical
blebs. Normal heart size and mediastinal contours.

Critical Value/emergent results were called by telephone at the time
of interpretation on 11/25/2021 at [DATE] to Triage RN (pt in
waiting room) Roy-Lil , who verbally acknowledged these results.
IMPRESSION: Small hydropneumothorax on the left.

Apical emphysema with left-sided bullae by prior CT.

## 2022-04-26 ENCOUNTER — Telehealth: Payer: Self-pay | Admitting: *Deleted

## 2022-04-26 NOTE — Telephone Encounter (Signed)
"  Dr. Amalia Hailey has been treating me for Plantar Fasciitis.  He said he was recommending physical therapy.  I'm checking my insurance.  Can I get a handicap thing for my car?"  Okay for 3 months

## 2022-04-27 ENCOUNTER — Other Ambulatory Visit: Payer: Self-pay | Admitting: Podiatry

## 2022-04-27 NOTE — Telephone Encounter (Signed)
Filled out form, called pt let her know it's ready for pick up.

## 2022-05-02 ENCOUNTER — Telehealth: Payer: Self-pay | Admitting: Podiatry

## 2022-05-02 NOTE — Telephone Encounter (Signed)
Patient would like a Referral put in for [facility] on [address] please advise

## 2022-05-03 NOTE — Telephone Encounter (Signed)
Okay to place an order for [facility] on [address]. Ammie could you use a [facility] form and fax it to the location on [address]?  Thanks, Dr. Amalia Hailey

## 2022-05-04 ENCOUNTER — Other Ambulatory Visit: Payer: Self-pay | Admitting: *Deleted

## 2022-05-04 DIAGNOSIS — M722 Plantar fascial fibromatosis: Secondary | ICD-10-CM

## 2022-05-04 NOTE — Progress Notes (Unsigned)
am

## 2022-05-04 NOTE — Telephone Encounter (Signed)
Faxed referral to [facility], confirmation received 05/04/22

## 2022-05-05 NOTE — Telephone Encounter (Signed)
Thanks Ammie!

## 2022-05-10 DIAGNOSIS — M25571 Pain in right ankle and joints of right foot: Secondary | ICD-10-CM | POA: Diagnosis not present

## 2022-05-10 DIAGNOSIS — M799 Soft tissue disorder, unspecified: Secondary | ICD-10-CM | POA: Diagnosis not present

## 2022-05-10 DIAGNOSIS — M6281 Muscle weakness (generalized): Secondary | ICD-10-CM | POA: Diagnosis not present

## 2022-05-12 DIAGNOSIS — M6281 Muscle weakness (generalized): Secondary | ICD-10-CM | POA: Diagnosis not present

## 2022-05-12 DIAGNOSIS — M799 Soft tissue disorder, unspecified: Secondary | ICD-10-CM | POA: Diagnosis not present

## 2022-05-12 DIAGNOSIS — M25571 Pain in right ankle and joints of right foot: Secondary | ICD-10-CM | POA: Diagnosis not present

## 2022-05-15 DIAGNOSIS — J449 Chronic obstructive pulmonary disease, unspecified: Secondary | ICD-10-CM | POA: Diagnosis not present

## 2022-05-16 DIAGNOSIS — M799 Soft tissue disorder, unspecified: Secondary | ICD-10-CM | POA: Diagnosis not present

## 2022-05-16 DIAGNOSIS — M25571 Pain in right ankle and joints of right foot: Secondary | ICD-10-CM | POA: Diagnosis not present

## 2022-05-16 DIAGNOSIS — M6281 Muscle weakness (generalized): Secondary | ICD-10-CM | POA: Diagnosis not present

## 2022-05-18 DIAGNOSIS — M799 Soft tissue disorder, unspecified: Secondary | ICD-10-CM | POA: Diagnosis not present

## 2022-05-18 DIAGNOSIS — M6281 Muscle weakness (generalized): Secondary | ICD-10-CM | POA: Diagnosis not present

## 2022-05-18 DIAGNOSIS — M25571 Pain in right ankle and joints of right foot: Secondary | ICD-10-CM | POA: Diagnosis not present

## 2022-05-23 DIAGNOSIS — M25571 Pain in right ankle and joints of right foot: Secondary | ICD-10-CM | POA: Diagnosis not present

## 2022-05-23 DIAGNOSIS — M799 Soft tissue disorder, unspecified: Secondary | ICD-10-CM | POA: Diagnosis not present

## 2022-05-23 DIAGNOSIS — M6281 Muscle weakness (generalized): Secondary | ICD-10-CM | POA: Diagnosis not present

## 2022-05-25 DIAGNOSIS — M799 Soft tissue disorder, unspecified: Secondary | ICD-10-CM | POA: Diagnosis not present

## 2022-05-25 DIAGNOSIS — M25571 Pain in right ankle and joints of right foot: Secondary | ICD-10-CM | POA: Diagnosis not present

## 2022-05-25 DIAGNOSIS — M6281 Muscle weakness (generalized): Secondary | ICD-10-CM | POA: Diagnosis not present

## 2022-06-01 DIAGNOSIS — M25571 Pain in right ankle and joints of right foot: Secondary | ICD-10-CM | POA: Diagnosis not present

## 2022-06-01 DIAGNOSIS — M799 Soft tissue disorder, unspecified: Secondary | ICD-10-CM | POA: Diagnosis not present

## 2022-06-01 DIAGNOSIS — M6281 Muscle weakness (generalized): Secondary | ICD-10-CM | POA: Diagnosis not present

## 2022-06-03 DIAGNOSIS — M6281 Muscle weakness (generalized): Secondary | ICD-10-CM | POA: Diagnosis not present

## 2022-06-03 DIAGNOSIS — M25571 Pain in right ankle and joints of right foot: Secondary | ICD-10-CM | POA: Diagnosis not present

## 2022-06-03 DIAGNOSIS — M799 Soft tissue disorder, unspecified: Secondary | ICD-10-CM | POA: Diagnosis not present

## 2022-06-08 DIAGNOSIS — M25571 Pain in right ankle and joints of right foot: Secondary | ICD-10-CM | POA: Diagnosis not present

## 2022-06-08 DIAGNOSIS — M6281 Muscle weakness (generalized): Secondary | ICD-10-CM | POA: Diagnosis not present

## 2022-06-08 DIAGNOSIS — M799 Soft tissue disorder, unspecified: Secondary | ICD-10-CM | POA: Diagnosis not present

## 2022-06-10 DIAGNOSIS — M6281 Muscle weakness (generalized): Secondary | ICD-10-CM | POA: Diagnosis not present

## 2022-06-10 DIAGNOSIS — M25571 Pain in right ankle and joints of right foot: Secondary | ICD-10-CM | POA: Diagnosis not present

## 2022-06-10 DIAGNOSIS — M799 Soft tissue disorder, unspecified: Secondary | ICD-10-CM | POA: Diagnosis not present

## 2022-06-14 DIAGNOSIS — J449 Chronic obstructive pulmonary disease, unspecified: Secondary | ICD-10-CM | POA: Diagnosis not present

## 2022-06-15 DIAGNOSIS — M799 Soft tissue disorder, unspecified: Secondary | ICD-10-CM | POA: Diagnosis not present

## 2022-06-15 DIAGNOSIS — M25571 Pain in right ankle and joints of right foot: Secondary | ICD-10-CM | POA: Diagnosis not present

## 2022-06-15 DIAGNOSIS — M6281 Muscle weakness (generalized): Secondary | ICD-10-CM | POA: Diagnosis not present

## 2022-06-16 ENCOUNTER — Telehealth: Payer: Self-pay | Admitting: Podiatry

## 2022-06-16 NOTE — Telephone Encounter (Signed)
No faxed received in BTG today from [facility]

## 2022-06-16 NOTE — Telephone Encounter (Signed)
The physical therapist from [facility] called wanting to know if you all received the dry needling consent form. She stated it was sent through the fax machine.   Please advise.

## 2022-06-17 DIAGNOSIS — M6281 Muscle weakness (generalized): Secondary | ICD-10-CM | POA: Diagnosis not present

## 2022-06-17 DIAGNOSIS — M25571 Pain in right ankle and joints of right foot: Secondary | ICD-10-CM | POA: Diagnosis not present

## 2022-06-17 DIAGNOSIS — M799 Soft tissue disorder, unspecified: Secondary | ICD-10-CM | POA: Diagnosis not present

## 2022-06-17 NOTE — Telephone Encounter (Signed)
Okay for dry needling.  I have not received the fax but that is perfectly fine to proceed with the dry needling.  Thanks, Dr. Amalia Hailey

## 2022-06-17 NOTE — Telephone Encounter (Signed)
I called and informed Kim Braun and she said she would let the therapist know that it's okay to do the dry needling.

## 2022-06-21 DIAGNOSIS — M799 Soft tissue disorder, unspecified: Secondary | ICD-10-CM | POA: Diagnosis not present

## 2022-06-21 DIAGNOSIS — M25571 Pain in right ankle and joints of right foot: Secondary | ICD-10-CM | POA: Diagnosis not present

## 2022-06-21 DIAGNOSIS — M6281 Muscle weakness (generalized): Secondary | ICD-10-CM | POA: Diagnosis not present

## 2022-06-27 DIAGNOSIS — M6281 Muscle weakness (generalized): Secondary | ICD-10-CM | POA: Diagnosis not present

## 2022-06-27 DIAGNOSIS — M25571 Pain in right ankle and joints of right foot: Secondary | ICD-10-CM | POA: Diagnosis not present

## 2022-06-27 DIAGNOSIS — M799 Soft tissue disorder, unspecified: Secondary | ICD-10-CM | POA: Diagnosis not present

## 2022-07-15 DIAGNOSIS — J449 Chronic obstructive pulmonary disease, unspecified: Secondary | ICD-10-CM | POA: Diagnosis not present

## 2022-07-19 ENCOUNTER — Telehealth: Payer: Self-pay

## 2022-07-19 ENCOUNTER — Ambulatory Visit: Payer: PPO | Admitting: Podiatry

## 2022-07-19 DIAGNOSIS — M722 Plantar fascial fibromatosis: Secondary | ICD-10-CM | POA: Diagnosis not present

## 2022-07-19 MED ORDER — BETAMETHASONE SOD PHOS & ACET 6 (3-3) MG/ML IJ SUSP
3.0000 mg | Freq: Once | INTRAMUSCULAR | Status: AC
  Administered 2022-07-19: 3 mg via INTRA_ARTICULAR

## 2022-07-19 NOTE — Progress Notes (Signed)
Chief Complaint  Patient presents with   Consult    Patient is here for surgery consult.    Subjective: 61 y.o. female presenting today for follow-up evaluation of plantar fasciitis to the right foot.  Patient continues to have pain and tenderness to the right heel.  She is recently completed physical therapy with minimal improvement.  She presents for further treatment and evaluation  Past Medical History:  Diagnosis Date   COPD (chronic obstructive pulmonary disease) ([place]) 01/2019   History of cardiac murmur as a child    child   History of gestational diabetes    Hypertension    Left bundle branch block    11/2012 low risk, no ischemia, NL EF stress test (Dr. Marlou Porch)   Rheumatoid arthritis(714.0)    Smoker    Thyroid nodule    Past Surgical History:  Procedure Laterality Date   ABDOMINAL HYSTERECTOMY  12/19/2012   Procedure: HYSTERECTOMY ABDOMINAL;  Surgeon: Maeola Sarah. Landry Mellow, MD;  Location: [place] [facility];  Service: Gynecology;  Laterality: N/A;  Incision @ 1517   ANTERIOR CERVICAL DECOMP/DISCECTOMY FUSION N/A 07/17/2013   Cervical four-five, Cervical five-six Anterior cervical decompression/diskectomy/fusion;  Surgeon: Floyce Stakes, MD;  Location: [facility];  Service: Neurosurgery;  Laterality: N/A;  Cervical four-five, Cervical five-six Anterior cervical decompression/diskectomy/fusion   BILATERAL SALPINGECTOMY  12/19/2012   Procedure: BILATERAL SALPINGECTOMY;  Surgeon: Maeola Sarah. Landry Mellow, MD;  Location: [facility] [facility];  Service: Gynecology;  Laterality: Bilateral;   CARDIAC CATHETERIZATION  09/30/2014   Procedure: LEFT HEART CATH AND CORONARY ANGIOGRAPHY;  Surgeon: Laverda Page, MD;  Location: [facility] CATH LAB;  Service: Cardiovascular;;   CESAREAN SECTION     COLON SURGERY  1992   CYSTOSCOPY  12/19/2012   Procedure: CYSTOSCOPY;  Surgeon: Maeola Sarah. Landry Mellow, MD;  Location: [place] [facility];  Service: Gynecology;  Laterality: N/A;   ROBOTIC ASSISTED TOTAL HYSTERECTOMY  12/19/2012   total hysterectomy for  fibroids, ovaries removed [name])   No Known Allergies   Objective: Physical Exam General: The patient is alert and oriented x3 in no acute distress.  Dermatology: Skin is warm, dry and supple bilateral lower extremities. Negative for open lesions or macerations bilateral.   Vascular: Dorsalis Pedis and Posterior Tibial pulses palpable bilateral.  Capillary fill time is immediate to all digits.  Neurological: Epicritic and protective threshold intact bilateral.   Musculoskeletal: There continues to be pain to palpation to the plantar aspect of the right heel along the plantar fascia. All other joints range of motion within normal limits bilateral. Strength 5/5 in all groups bilateral.   Assessment: 1. Plantar fasciitis right  Plan of Care:  1. Patient evaluated.   2.  Today's surgery was discussed in detail.  Risk benefits advantages and disadvantages as well as postoperative recovery course were explained in great detail.  All patient questions were answered.  No guarantees were expressed or implied.  The patient states that she is ready to proceed with surgery to address the chronic plantar fasciitis of the right heel which has not improved with conservative treatment 3.  Authorization for surgery was initiated today.  Surgery will consist of endoscopic plantar fasciotomy right 4.  In the meantime, injection of 0.5 cc Celestone Soluspan injected into the plantar fascia right heel 5.  Return to clinic as needed  Edrick Kins, DPM [facility]  Dr. Edrick Kins, DPM    [address]  [place], [place] [address]                Office [phone]  Fax [phone]

## 2022-07-19 NOTE — Telephone Encounter (Signed)
Received surgery paperwork from the [place] office. Left a message for Floyd to call and schedule surgery with Dr. Amalia Hailey.

## 2022-07-22 ENCOUNTER — Telehealth: Payer: Self-pay | Admitting: Urology

## 2022-07-22 NOTE — Telephone Encounter (Signed)
DOS - 08/11/22  EPF RIGHT --- [id number]  HTA EFFECTIVE DATE -    RECEIVED FAX FROM [facility] STATING THAT CPT CODE 01586 HAS BEEN APPROVED, AUTH # [id number], GOOD FROM 08/11/22 - 11/09/22.

## 2022-08-03 ENCOUNTER — Encounter: Payer: Self-pay | Admitting: Internal Medicine

## 2022-08-03 ENCOUNTER — Ambulatory Visit: Payer: PPO | Admitting: Internal Medicine

## 2022-08-03 VITALS — BP 124/74 | HR 94 | Temp 98.0°F | Ht 62.75 in | Wt 109.0 lb

## 2022-08-03 DIAGNOSIS — J449 Chronic obstructive pulmonary disease, unspecified: Secondary | ICD-10-CM

## 2022-08-03 DIAGNOSIS — Z720 Tobacco use: Secondary | ICD-10-CM | POA: Diagnosis not present

## 2022-08-03 NOTE — Patient Instructions (Signed)
Continue inhalers as prescribed Continue lung cancer screening protocol Please stop smoking

## 2022-08-03 NOTE — Progress Notes (Signed)
Name: Kim Braun MRN: [id number] DOB: 17-Jun-1961     CONSULTATION DATE: 08/03/2022  REFERRING MD : Dr. Aileen Pilot  SYNOPSIS 61 year old pleasant white female seen today to establish care for assessment for COPD Patient is an active smoker of 1/2 pack/day for the last 56 to 27 years  She has been diagnosed with COPD 2 years ago Patient supposedly had a diagnosis of pneumonia at the same time   Patient has chronic shortness of breath chronic dyspnea on exertion Patient has a productive cough for many years which is consistent with chronic bronchitis   CHIEF COMPLAINT: FOLLOW UP COPD   HPI  No exacerbation at this time No evidence of heart failure at this time No evidence or signs of infection at this time No respiratory distress No fevers, chills, nausea, vomiting, diarrhea No evidence of lower extremity edema No evidence hemoptysis  Continues to use Spiriva Respimat 2.5 without any issues  Smoking Assessment and Cessation Counseling Upon further questioning, Patient smokes 1/2 ppd I have advised patient to quit/stop smoking as soon as possible due to high risk for multiple medical problems  Patient is NOT willing to quit smoking  I have advised patient that we can assist and have options of Nicotine replacement therapy. I also advised patient on behavioral therapy and can provide oral medication therapy in conjunction with the other therapies Follow up next Office visit  for assessment of smoking cessation Smoking cessation counseling advised for 4 minutes  CT scan independently reviewed by me today CT scan reviewed lung cancer screening protocol from March 2023 Extensive bullous emphysema No lung nodules or masses noted   PAST MEDICAL HISTORY :   has a past medical history of COPD (chronic obstructive pulmonary disease) ([place]) (01/2019), History of cardiac murmur as a child, History of gestational diabetes, Hypertension, Left bundle branch block, Rheumatoid  arthritis(714.0), Smoker, and Thyroid nodule.  has a past surgical history that includes Cesarean section; Colon surgery (1992); Robotic assisted total hysterectomy (12/19/2012); Bilateral salpingectomy (12/19/2012); Abdominal hysterectomy (12/19/2012); Cystoscopy (12/19/2012); Anterior cervical decomp/discectomy fusion (N/A, 07/17/2013); and Cardiac catheterization (09/30/2014). Prior to Admission medications   Medication Sig Start Date End Date Taking? Authorizing Provider  Adalimumab (HUMIRA PEN) 40 MG/0.4ML PNKT Inject '40mg'$  subcutaneously every *OTHER week as directed 02/05/20  Yes [provider]  albuterol (PROVENTIL HFA;VENTOLIN HFA) 108 (90 BASE) MCG/ACT inhaler Inhale 2 puffs into the lungs daily as needed for wheezing or shortness of breath.   Yes [provider]  alendronate (FOSAMAX) 70 MG tablet Take 70 mg by mouth once a week. 11/12/20  Yes [provider]  betamethasone dipropionate 0.05 % cream Apply topically 2 (two) times daily.   Yes [provider]  Biotin 10 MG TABS 1 tablet   Yes [provider]  CALCIUM-VITAMIN D PO Take by mouth daily. Calcium 600 mg + vit D3   Yes [provider]  Cholecalciferol (VITAMIN D3 PO) Take 2,000 Units by mouth.   Yes [provider]  KLOR-CON M20 20 MEQ tablet Take 20 mEq by mouth daily. 09/14/20  Yes [provider]  L-Lysine 500 MG TABS Take by mouth daily.   Yes [provider]  losartan (COZAAR) 100 MG tablet Take 100 mg by mouth daily. 11/10/17  Yes [provider]  SPIRIVA HANDIHALER 18 MCG inhalation capsule 1 capsule daily. 08/24/20  Yes [provider]   No Known Allergies  FAMILY HISTORY:  family history includes Breast cancer in her sister; CAD in  her brother and mother; Diabetes in her sister; Hypertension in her brother, brother, and sister; Stroke in her mother. SOCIAL HISTORY:  reports that she has been smoking cigarettes. She has a 22.00  pack-year smoking history. She has never used smokeless tobacco. She reports current alcohol use of about 14.0 - 24.0 standard drinks of alcohol per week. She reports that she does not use drugs.      Review of Systems: Gen:  Denies  fever, sweats, chills weight loss  HEENT: Denies blurred vision, double vision, ear pain, eye pain, hearing loss, nose bleeds, sore throat Cardiac:  No dizziness, chest pain or heaviness, chest tightness,edema, No JVD Resp:   No cough, -sputum production, +shortness of breath,-wheezing, -hemoptysis,  Other:  All other systems negative  BP 124/74 (BP Location: Left Arm, Cuff Size: Normal)   Pulse 94   Temp 98 F (36.7 C) (Temporal)   Ht 5' 2.75" (1.594 m)   Wt 109 lb (49.4 kg)   LMP 12/03/2012   SpO2 99%   BMI 19.46 kg/m    Physical Examination:   General Appearance: No distress  EYES PERRLA, EOM intact.   NECK Supple, No JVD Pulmonary: normal breath sounds, No wheezing.  CardiovascularNormal S1,S2.  No m/r/g.   Abdomen: Benign, Soft, non-tender. 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 All questions  answered  Follow-up in 1 year  Total time spent 21 mins  Kim Braun Patricia Pesa, M.D.  [facility]  Medical Director [place] Director [facility] Cardio-Pulmonary Department

## 2022-08-04 ENCOUNTER — Telehealth: Payer: Self-pay

## 2022-08-04 ENCOUNTER — Encounter: Payer: Self-pay | Admitting: Podiatry

## 2022-08-04 NOTE — Telephone Encounter (Signed)
Received letter from [facility] for medical clearance. Patient is scheduled for surgery 08/11/2022. Written medical clearance is needed. Does patient need to stop any medications prior to surgery?  Dr. Mortimer Fries, please advise.

## 2022-08-05 ENCOUNTER — Telehealth: Payer: Self-pay

## 2022-08-05 ENCOUNTER — Telehealth: Payer: Self-pay | Admitting: *Deleted

## 2022-08-05 ENCOUNTER — Telehealth: Payer: Self-pay | Admitting: Internal Medicine

## 2022-08-05 DIAGNOSIS — Z01810 Encounter for preprocedural cardiovascular examination: Secondary | ICD-10-CM

## 2022-08-05 NOTE — Telephone Encounter (Signed)
   Pre-operative Risk Assessment    Patient Name: Kim Braun  DOB: December 17, 1960 MRN: [id number]     ANESTHESIA DEPT REQUESTING CLEARANCE  Request for Surgical Clearance    Procedure:   ENDOSCOPIC PLANTAR FASCIOTOMY RIGHT FOOT  Date of Surgery:  Clearance 08/11/22                                 Surgeon:   Surgeon's Group or Practice Name:  [facility]; ANESTHESIOLOGY DEPT  Phone number:  [phone] Fax number:  [phone]    Type of Clearance Requested:   - Medical ; NO MEDICATIONS ARE LISTED AS NEEDING TO BE HELD    Type of Anesthesia:  Not Indicated   Additional requests/questions:    Jiles Prows   08/05/2022, 1:33 PM

## 2022-08-05 NOTE — Telephone Encounter (Signed)
Please refer to 08/04/2022 phone note.  Spoke to patient. She stated that her foot surgery has been canceled, due to not being cleared by Dr. Mortimer Fries. Patient is very upset about this and states she has been dealing with this for over a year. She would like to have surgery as planned on 08/11/2022. According to  08/05/2022 phone note from [facility], it appears that surgery was in fact canceled due to not being clear. Dr. Mortimer Fries did not state that patient was not cleared. He stated that she was moderate risk and it would be ideal if she was smoke free for 4-6 weeks. Patient stated that she can not wait another 4-6 weeks to have surgery.   Dr. Mortimer Fries, please advise. Routing to Psychologist, sport and exercise as well.

## 2022-08-05 NOTE — Telephone Encounter (Signed)
Okay thank you

## 2022-08-05 NOTE — Telephone Encounter (Signed)
I s/w the pt and she was very upset. Before I could say who I was and where I was calling from, she assumed that I was calling from Dr. Mortimer Fries office. Pt began to state to me that she is very upset that her surgery was cancelled. She tells me that Dr. Mortimer Fries is not clearing her. I stated to th pt that I cannot answer for another doctor office as to their decisions. I informed the that all we can do on our end for cardiology is focus on her and her heart. Pt vented some more and then was agreeable to tele pre op appt 08/08/22 @ 1:40 urgent add on. Pt asked me did the clearance request come from Dr. Daylene Katayama or the surgical center. I stated the Surgical center from the anesthesiology dept. Med rec and consent are done.     Patient Consent for Virtual Visit        ELLARAE NEVITT has provided verbal consent on 08/05/2022 for a virtual visit (video or telephone).   CONSENT FOR VIRTUAL VISIT FOR:  Estill Dooms Biehn  By participating in this virtual visit I agree to the following:  I hereby voluntarily request, consent and authorize [place] and its employed or contracted physicians, physician assistants, nurse practitioners or other licensed health care professionals (the Practitioner), to provide me with telemedicine health care services (the "Services") as deemed necessary by the treating Practitioner. I acknowledge and consent to receive the Services by the Practitioner via telemedicine. I understand that the telemedicine visit will involve communicating with the Practitioner through live audiovisual communication technology and the disclosure of certain medical information by electronic transmission. I acknowledge that I have been given the opportunity to request an in-person assessment or other available alternative prior to the telemedicine visit and am voluntarily participating in the telemedicine visit.  I understand that I have the right to withhold or withdraw my consent to the use of  telemedicine in the course of my care at any time, without affecting my right to future care or treatment, and that the Practitioner or I may terminate the telemedicine visit at any time. I understand that I have the right to inspect all information obtained and/or recorded in the course of the telemedicine visit and may receive copies of available information for a reasonable fee.  I understand that some of the potential risks of receiving the Services via telemedicine include:  Delay or interruption in medical evaluation due to technological equipment failure or disruption; Information transmitted may not be sufficient (e.g. poor resolution of images) to allow for appropriate medical decision making by the Practitioner; and/or  In rare instances, security protocols could fail, causing a breach of personal health information.  Furthermore, I acknowledge that it is my responsibility to provide information about my medical history, conditions and care that is complete and accurate to the best of my ability. I acknowledge that Practitioner's advice, recommendations, and/or decision may be based on factors not within their control, such as incomplete or inaccurate data provided by me or distortions of diagnostic images or specimens that may result from electronic transmissions. I understand that the practice of medicine is not an exact science and that Practitioner makes no warranties or guarantees regarding treatment outcomes. I acknowledge that a copy of this consent can be made available to me via my patient portal ([place]), or I can request a printed copy by calling the office of [place].    I  understand that my insurance will be billed for this visit.   I have read or had this consent read to me. I understand the contents of this consent, which adequately explains the benefits and risks of the Services being provided via telemedicine.  I have been provided ample opportunity to  ask questions regarding this consent and the Services and have had my questions answered to my satisfaction. I give my informed consent for the services to be provided through the use of telemedicine in my medical care

## 2022-08-05 NOTE — Telephone Encounter (Signed)
Received fax from Dr. Mortimer Fries stating Kim Braun is not cleared for surgery with Dr. Amalia Hailey on 08/11/2022. See his note in epic dated 08/05/2022. I have cancelled her surgery and [name] is aware

## 2022-08-05 NOTE — Telephone Encounter (Signed)
Recommendations have been printed and faxed to [facility].

## 2022-08-05 NOTE — Telephone Encounter (Signed)
I s/w the pt and she was very upset. Before I could say who I was and where I was calling from, she assumed that I was calling from Dr. Mortimer Fries office. Pt began to state to me that she is very upset that her surgery was cancelled. She tells me that Dr. Mortimer Fries is not clearing her. I stated to th pt that I cannot answer for another doctor office as to their decisions. I informed the that all we can do on our end for cardiology is focus on her and her heart. Pt vented some more and then was agreeable to tele pre op appt 08/08/22 @ 1:40 urgent add on. Pt asked me did the clearance request come from Dr. Daylene Katayama or the surgical center. I stated the Surgical center from the anesthesiology dept. Med rec and consent are done.

## 2022-08-05 NOTE — Telephone Encounter (Signed)
   Name: YARITSA SAVARINO  DOB: 10/06/1961  MRN: [id number]  Primary Cardiologist: None  Chart reviewed as part of pre-operative protocol coverage. Because of Mylei Brackeen Balsley's past medical history and time since last visit, she will require a follow-up telephone visit in order to better assess preoperative cardiovascular risk.  Pre-op covering staff: - Please schedule appointment and call patient to inform them. If patient already had an upcoming appointment within acceptable timeframe, please add "pre-op clearance" to the appointment notes so provider is aware. - Please contact requesting surgeon's office via preferred method (i.e, phone, fax) to inform them of need for appointment prior to surgery.  No medications are indicated as needing to be held.  Elgie Collard, PA-C  08/05/2022, 1:49 PM

## 2022-08-08 ENCOUNTER — Ambulatory Visit: Payer: PPO | Attending: Cardiology | Admitting: Physician Assistant

## 2022-08-08 DIAGNOSIS — Z01810 Encounter for preprocedural cardiovascular examination: Secondary | ICD-10-CM | POA: Diagnosis not present

## 2022-08-08 NOTE — Telephone Encounter (Signed)
Kim Braun spoke to Dr. Zoila Shutter office and they informed her that they did not state she was not cleared for surgery. I reviewed the notes again and it was my mistake. She has been added back on for surgery with Dr. Amalia Hailey on 08/11/2022. [name] has been notified of the miscommunication on my part.

## 2022-08-08 NOTE — Progress Notes (Signed)
Virtual Visit via Telephone Note   Because of Kim Braun's co-morbid illnesses, she is at least at moderate risk for complications without adequate follow up.  This format is felt to be most appropriate for this patient at this time.  The patient did not have access to video technology/had technical difficulties with video requiring transitioning to audio format only (telephone).  All issues noted in this document were discussed and addressed.  No physical exam could be performed with this format.  Please refer to the patient's chart for her consent to telehealth for [facility].  Evaluation Performed:  Preoperative cardiovascular risk assessment _____________   Date:  08/08/2022   Patient ID:  Kim Braun, DOB June 24, 1961, MRN [id number] Patient Location:  Home Provider location:   Office  Primary Care Provider:  Ria Bush, MD Primary Cardiologist:  None  Chief Complaint / Patient Profile   61 y.o. y/o female with a h/o smoking, CAD, COPD/emphysema, hypertension, hyperlipidemia, left bundle branch block, memory/cognitive impairment, and mildly depressed ejection fraction on echocardiogram 09/2019 who is pending endoscopic plantar fasciotomy right foot and presents today for telephonic preoperative cardiovascular risk assessment.  Lexiscan Myoview 01/14/2022 reviewed which was a low risk study.  No evidence of ischemia or infarction.  Past Medical History    Past Medical History:  Diagnosis Date   COPD (chronic obstructive pulmonary disease) ([place]) 01/2019   History of cardiac murmur as a child    child   History of gestational diabetes    Hypertension    Left bundle branch block    11/2012 low risk, no ischemia, NL EF stress test (Dr. Marlou Porch)   Rheumatoid arthritis(714.0)    Smoker    Thyroid nodule    Past Surgical History:  Procedure Laterality Date   ABDOMINAL HYSTERECTOMY  12/19/2012   Procedure: HYSTERECTOMY ABDOMINAL;  Surgeon: Maeola Sarah. Landry Mellow,  MD;  Location: [facility];  Service: Gynecology;  Laterality: N/A;  Incision @ 1517   ANTERIOR CERVICAL DECOMP/DISCECTOMY FUSION N/A 07/17/2013   Cervical four-five, Cervical five-six Anterior cervical decompression/diskectomy/fusion;  Surgeon: Floyce Stakes, MD;  Location: [facility];  Service: Neurosurgery;  Laterality: N/A;  Cervical four-five, Cervical five-six Anterior cervical decompression/diskectomy/fusion   BILATERAL SALPINGECTOMY  12/19/2012   Procedure: BILATERAL SALPINGECTOMY;  Surgeon: Maeola Sarah. Landry Mellow, MD;  Location: [facility];  Service: Gynecology;  Laterality: Bilateral;   CARDIAC CATHETERIZATION  09/30/2014   Procedure: LEFT HEART CATH AND CORONARY ANGIOGRAPHY;  Surgeon: Laverda Page, MD;  Location: [facility] C[facility] LAB;  Service: Cardiovascular;;   CESAREAN SECTION     COLON SURGERY  1992   CYSTOSCOPY  12/19/2012   Procedure: CYSTOSCOPY;  Surgeon: Maeola Sarah. Landry Mellow, MD;  Location: [facility];  Service: Gynecology;  Laterality: N/A;   ROBOTIC ASSISTED TOTAL HYSTERECTOMY  12/19/2012   total hysterectomy for fibroids, ovaries removed [name])    Allergies  No Known Allergies  History of Present Illness    Kim Braun is a 60 y.o. female who presents via audio/video conferencing for a telehealth visit today.  Pt was last seen in cardiology clinic on 01/04/2022 by Dr. Rockey Situ.  At that time Kim Braun was doing well .  The patient is now pending procedure as outlined above. Since her last visit, she has been feeling good from a cardiac standpoint.  She denies any chest pain or shortness of breath.  She does states she has some shortness of breath in the morning but when she takes  her inhalers this resolves.  She has not been doing much walking because of her foot.  Her foot is the main thing that holds her back.  She is able to climb a flight of stairs and do some light housework and light yard work.  She has a garden and she works in that and she does a lot of the cleaning around her house.   Because of this she scored a 4.86 on the DASI.  This exceeds the minimum requirement of 4 METS.  No medications are listed as needing to be held.   Home Medications    Prior to Admission medications   Medication Sig Start Date End Date Taking? Authorizing Provider  albuterol (PROVENTIL HFA;VENTOLIN HFA) 108 (90 BASE) MCG/ACT inhaler Inhale 2 puffs into the lungs daily as needed for wheezing or shortness of breath.    [provider]  alendronate (FOSAMAX) 70 MG tablet Take 70 mg by mouth once a week. 11/12/20   [provider]  aspirin EC 81 MG tablet Take 1 tablet (81 mg total) by mouth daily. Swallow whole. 01/04/22   Minna Merritts, MD  betamethasone dipropionate 0.05 % cream Apply topically 2 (two) times daily.    [provider]  Biotin 10 MG TABS 1 tablet    [provider]  CALCIUM-VITAMIN D PO Take by mouth daily. Calcium 600 mg + vit D3    [provider]  Cholecalciferol (VITAMIN D3 PO) Take 2,000 Units by mouth.    [provider]  Etanercept (ENBREL MINI) 50 MG/ML SOCT Inject into the skin.    [provider]  L-Lysine 500 MG TABS Take by mouth daily.    [provider]  potassium chloride (KLOR-CON M) 10 MEQ tablet Take 1 tablet (10 mEq total) by mouth daily. 04/05/22   Ria Bush, MD  rosuvastatin (CRESTOR) 10 MG tablet Take 1 tablet (10 mg total) by mouth daily. 01/04/22   Minna Merritts, MD  Tiotropium Bromide Monohydrate (SPIRIVA RESPIMAT) 2.5 MCG/ACT AERS Inhale 2 puffs into the lungs daily. 04/05/22   Ria Bush, MD  valsartan (DIOVAN) 80 MG tablet Take 1 tablet (80 mg total) by mouth daily. 04/05/22   Ria Bush, MD    Physical Exam    Vital Signs:  Kim Braun does not have vital signs available for review today.  Given telephonic nature of communication, physical exam is limited. AAOx3. NAD. Normal affect.  Speech and respirations are unlabored.  Accessory Clinical  Findings    None  Assessment & Plan    1.  Preoperative Cardiovascular Risk Assessment:  Ms. Bussa perioperative risk of a major cardiac event is 0.9% according to the Revised Cardiac Risk Index (RCRI).  Therefore, she is at low risk for perioperative complications.   Her functional capacity is fair at 4.86 METs according to the [facility] Activity Status Index (DASI). Recommendations: According to ACC/AHA guidelines, no further cardiovascular testing needed.  The patient may proceed to surgery at acceptable risk.    A copy of this note will be routed to requesting surgeon.  Time:   Today, I have spent 10 minutes with the patient with telehealth technology discussing medical history, symptoms, and management plan.     Elgie Collard, PA-C  08/08/2022, 1:57 PM

## 2022-08-08 NOTE — Telephone Encounter (Signed)
Patient is aware of below message and voiced her understanding.  Nothing further needed.   

## 2022-08-11 ENCOUNTER — Other Ambulatory Visit: Payer: Self-pay | Admitting: Podiatry

## 2022-08-11 DIAGNOSIS — G8918 Other acute postprocedural pain: Secondary | ICD-10-CM | POA: Diagnosis not present

## 2022-08-11 DIAGNOSIS — M722 Plantar fascial fibromatosis: Secondary | ICD-10-CM | POA: Diagnosis not present

## 2022-08-11 MED ORDER — OXYCODONE-ACETAMINOPHEN 5-325 MG PO TABS: 1.0000 | ORAL_TABLET | ORAL | 0 refills | Status: DC | PRN

## 2022-08-11 NOTE — Progress Notes (Signed)
PRN postop 

## 2022-08-15 ENCOUNTER — Telehealth: Payer: Self-pay | Admitting: *Deleted

## 2022-08-15 DIAGNOSIS — J449 Chronic obstructive pulmonary disease, unspecified: Secondary | ICD-10-CM | POA: Diagnosis not present

## 2022-08-15 NOTE — Telephone Encounter (Signed)
Patient is calling to let the physician know that her post surgical foot was draining a little on Thursday, has stopped and is dry now,is keeping it elevated, icing and staying off of it as much as possible. She is taking pain meds (only has taking 4 pills so far)prn.

## 2022-08-15 NOTE — Telephone Encounter (Signed)
That should be totally fine.  No need for earlier appointment.  Thanks, Dr. Amalia Hailey

## 2022-08-16 NOTE — Telephone Encounter (Signed)
Spoke with the patient and gave instructions per Dr. Amalia Hailey and she verbalized understanding and she stated that she would see him on Friday.

## 2022-08-19 ENCOUNTER — Ambulatory Visit (INDEPENDENT_AMBULATORY_CARE_PROVIDER_SITE_OTHER): Payer: PPO | Admitting: Podiatry

## 2022-08-19 ENCOUNTER — Encounter: Payer: PPO | Admitting: Podiatry

## 2022-08-19 ENCOUNTER — Ambulatory Visit (INDEPENDENT_AMBULATORY_CARE_PROVIDER_SITE_OTHER): Payer: PPO

## 2022-08-19 DIAGNOSIS — Z9889 Other specified postprocedural states: Secondary | ICD-10-CM

## 2022-08-19 NOTE — Progress Notes (Signed)
Chief Complaint  Patient presents with   Post-op Follow-up    Patient is here for post op visit DOS 08/11/22 patient states that she is feeling fine and healing fine, she has not taken any pain medication.    Subjective:  Patient presents today status post endoscopic plantar fasciotomy right foot. DOS: 08/11/2022.  Patient states that she is doing well.  She is weightbearing in the cam boot as instructed.  She states that the cam boot is very uncomfortable.  She wears a night splint mostly throughout the day or evenings when she is not on her foot.  She has kept the dressings clean dry and intact.  No new complaints at this time  Past Medical History:  Diagnosis Date   COPD (chronic obstructive pulmonary disease) ([place]) 01/2019   History of cardiac murmur as a child    child   History of gestational diabetes    Hypertension    Left bundle branch block    11/2012 low risk, no ischemia, NL EF stress test (Dr. Marlou Porch)   Rheumatoid arthritis(714.0)    Smoker    Thyroid nodule     Past Surgical History:  Procedure Laterality Date   ABDOMINAL HYSTERECTOMY  12/19/2012   Procedure: HYSTERECTOMY ABDOMINAL;  Surgeon: Maeola Sarah. Landry Mellow, MD;  Location: [facility];  Service: Gynecology;  Laterality: N/A;  Incision @ 1517   ANTERIOR CERVICAL DECOMP/DISCECTOMY FUSION N/A 07/17/2013   Cervical four-five, Cervical five-six Anterior cervical decompression/diskectomy/fusion;  Surgeon: Floyce Stakes, MD;  Location: [facility];  Service: Neurosurgery;  Laterality: N/A;  Cervical four-five, Cervical five-six Anterior cervical decompression/diskectomy/fusion   BILATERAL SALPINGECTOMY  12/19/2012   Procedure: BILATERAL SALPINGECTOMY;  Surgeon: Maeola Sarah. Landry Mellow, MD;  Location: [facility];  Service: Gynecology;  Laterality: Bilateral;   CARDIAC CATHETERIZATION  09/30/2014   Procedure: LEFT HEART CATH AND CORONARY ANGIOGRAPHY;  Surgeon: Laverda Page, MD;  Location: [facility] CATH LAB;  Service: Cardiovascular;;   CESAREAN  SECTION     COLON SURGERY  1992   CYSTOSCOPY  12/19/2012   Procedure: CYSTOSCOPY;  Surgeon: Maeola Sarah. Landry Mellow, MD;  Location: [facility];  Service: Gynecology;  Laterality: N/A;   ROBOTIC ASSISTED TOTAL HYSTERECTOMY  12/19/2012   total hysterectomy for fibroids, ovaries removed [name])    No Known Allergies  Objective/Physical Exam Neurovascular status intact.  Skin incisions appear to be well coapted with sutures intact. No sign of infectious process noted. No dehiscence. No active bleeding noted.  Negative for any significant edema noted to the surgical extremity.  Assessment: 1. s/p endoscopic plantar fasciotomy right. DOS: 08/11/2022   Plan of Care:  1. Patient was evaluated. X-rays reviewed 2.  Recommend antibiotic ointment and a light Band-Aid to the small incision sites daily 3.  Continue night splint in the evenings and when the patient is off of her foot 4.  Since the cam boot is very uncomfortable she may transition back into good supportive shoes and sneakers when weightbearing 5.  Return to clinic in 1 week for suture removal   Edrick Kins, DPM [facility]  Dr. Edrick Kins, DPM    [address], [place] [address]  Office [phone]  Fax [phone]

## 2022-08-25 ENCOUNTER — Ambulatory Visit (INDEPENDENT_AMBULATORY_CARE_PROVIDER_SITE_OTHER): Payer: PPO | Admitting: Podiatry

## 2022-08-25 DIAGNOSIS — M722 Plantar fascial fibromatosis: Secondary | ICD-10-CM

## 2022-08-25 DIAGNOSIS — Z9889 Other specified postprocedural states: Secondary | ICD-10-CM

## 2022-08-25 NOTE — Progress Notes (Signed)
   Chief Complaint  Patient presents with   Routine Post Op    POV #2 DOS 08/11/2022 EPF RT Pt stated that she still has some soreness     Subjective:  Patient presents today status post endoscopic plantar fasciotomy right foot. DOS: 08/11/2022.  She is to she is doing okay.  She still having some nervelike pain on the top of her foot.  Overall much better.  She denies any other acute issues  Past Medical History:  Diagnosis Date   COPD (chronic obstructive pulmonary disease) ([place]) 01/2019   History of cardiac murmur as a child    child   History of gestational diabetes    Hypertension    Left bundle branch block    11/2012 low risk, no ischemia, NL EF stress test (Dr. Marlou Porch)   Rheumatoid arthritis(714.0)    Smoker    Thyroid nodule     Past Surgical History:  Procedure Laterality Date   ABDOMINAL HYSTERECTOMY  12/19/2012   Procedure: HYSTERECTOMY ABDOMINAL;  Surgeon: Maeola Sarah. Landry Mellow, MD;  Location: [facility];  Service: Gynecology;  Laterality: N/A;  Incision @ 1517   ANTERIOR CERVICAL DECOMP/DISCECTOMY FUSION N/A 07/17/2013   Cervical four-five, Cervical five-six Anterior cervical decompression/diskectomy/fusion;  Surgeon: Floyce Stakes, MD;  Location: [facility];  Service: Neurosurgery;  Laterality: N/A;  Cervical four-five, Cervical five-six Anterior cervical decompression/diskectomy/fusion   BILATERAL SALPINGECTOMY  12/19/2012   Procedure: BILATERAL SALPINGECTOMY;  Surgeon: Maeola Sarah. Landry Mellow, MD;  Location: [facility];  Service: Gynecology;  Laterality: Bilateral;   CARDIAC CATHETERIZATION  09/30/2014   Procedure: LEFT HEART CATH AND CORONARY ANGIOGRAPHY;  Surgeon: Laverda Page, MD;  Location: [facility] CATH LAB;  Service: Cardiovascular;;   CESAREAN SECTION     COLON SURGERY  1992   CYSTOSCOPY  12/19/2012   Procedure: CYSTOSCOPY;  Surgeon: Maeola Sarah. Landry Mellow, MD;  Location: [facility];  Service: Gynecology;  Laterality: N/A;   ROBOTIC ASSISTED TOTAL HYSTERECTOMY  12/19/2012   total hysterectomy for  fibroids, ovaries removed [name])    No Known Allergies  Objective/Physical Exam Neurovascular status intact.  Skin incisions appear to be well coapted with sutures intact. No sign of infectious process noted. No dehiscence. No active bleeding noted.  Negative for any significant edema noted to the surgical extremity.  Assessment: 1. s/p endoscopic plantar fasciotomy right. DOS: 08/11/2022   Plan of Care:  1. Patient was evaluated. X-rays reviewed 2.  Sutures were removed no dehiscence noted Band-Aids were applied 3.  Continue night splint in the evenings and when the patient is off of her foot 4.  Weightbearing as tolerated in regular shoes 5.  Return to clinic see Dr. Amalia Hailey for final clearance

## 2022-08-26 ENCOUNTER — Encounter: Payer: PPO | Admitting: Podiatry

## 2022-09-09 ENCOUNTER — Encounter: Payer: PPO | Admitting: Podiatry

## 2022-09-09 ENCOUNTER — Ambulatory Visit (INDEPENDENT_AMBULATORY_CARE_PROVIDER_SITE_OTHER): Payer: PPO | Admitting: Podiatry

## 2022-09-09 ENCOUNTER — Ambulatory Visit (INDEPENDENT_AMBULATORY_CARE_PROVIDER_SITE_OTHER): Payer: PPO

## 2022-09-09 DIAGNOSIS — Z9889 Other specified postprocedural states: Secondary | ICD-10-CM | POA: Diagnosis not present

## 2022-09-10 NOTE — Progress Notes (Signed)
   Chief Complaint  Patient presents with   Post-op Follow-up    POV #3 DOS 08/11/2022 EPF RT    Subjective:  Patient presents today status post endoscopic plantar fasciotomy right foot. DOS: 08/11/2022.  Patient continues to do well.  She is now about 1 month postop.  She wears a night splint mostly throughout the night.  No new complaints at this time  Past Medical History:  Diagnosis Date   COPD (chronic obstructive pulmonary disease) ([place]) 01/2019   History of cardiac murmur as a child    child   History of gestational diabetes    Hypertension    Left bundle branch block    11/2012 low risk, no ischemia, NL EF stress test (Dr. Marlou Porch)   Rheumatoid arthritis(714.0)    Smoker    Thyroid nodule     Past Surgical History:  Procedure Laterality Date   ABDOMINAL HYSTERECTOMY  12/19/2012   Procedure: HYSTERECTOMY ABDOMINAL;  Surgeon: Maeola Sarah. Landry Mellow, MD;  Location: [facility];  Service: Gynecology;  Laterality: N/A;  Incision @ 1517   ANTERIOR CERVICAL DECOMP/DISCECTOMY FUSION N/A 07/17/2013   Cervical four-five, Cervical five-six Anterior cervical decompression/diskectomy/fusion;  Surgeon: Floyce Stakes, MD;  Location: [facility];  Service: Neurosurgery;  Laterality: N/A;  Cervical four-five, Cervical five-six Anterior cervical decompression/diskectomy/fusion   BILATERAL SALPINGECTOMY  12/19/2012   Procedure: BILATERAL SALPINGECTOMY;  Surgeon: Maeola Sarah. Landry Mellow, MD;  Location: [facility];  Service: Gynecology;  Laterality: Bilateral;   CARDIAC CATHETERIZATION  09/30/2014   Procedure: LEFT HEART CATH AND CORONARY ANGIOGRAPHY;  Surgeon: Laverda Page, MD;  Location: [facility]ATH LAB;  Service: Cardiovascular;;   CESAREAN SECTION     COLON SURGERY  1992   CYSTOSCOPY  12/19/2012   Procedure: CYSTOSCOPY;  Surgeon: Maeola Sarah. Landry Mellow, MD;  Location: [facility];  Service: Gynecology;  Laterality: N/A;   ROBOTIC ASSISTED TOTAL HYSTERECTOMY  12/19/2012   total hysterectomy for fibroids, ovaries removed [name])     No Known Allergies  Objective/Physical Exam Neurovascular status intact.  Skin incisions nicely healed.  No erythema or edema.  There continues to be some slight residual tenderness with palpation along the plantar heel.  Assessment: 1. s/p endoscopic plantar fasciotomy right. DOS: 08/11/2022  Plan of Care:  1. Patient was evaluated.  2.  Patient may now wear good supportive shoes and sneakers.  She may discontinue the cam boot 3.  Slowly increase activity.  Recommend light daily stretching exercises 4.  Advised against going barefoot 5.  Return to clinic 8 weeks   Edrick Kins, DPM [facility]  Dr. Edrick Kins, DPM    [address],  [address]                Office [phone]  Fax [phone]

## 2022-09-12 ENCOUNTER — Ambulatory Visit (INDEPENDENT_AMBULATORY_CARE_PROVIDER_SITE_OTHER): Payer: PPO | Admitting: Nurse Practitioner

## 2022-09-12 ENCOUNTER — Ambulatory Visit
Admission: RE | Admit: 2022-09-12 | Discharge: 2022-09-12 | Disposition: A | Discharge status: Home / Self Care | Payer: PPO | Source: Ambulatory Visit | Attending: Nurse Practitioner | Admitting: Nurse Practitioner

## 2022-09-12 VITALS — BP 124/72 | HR 92 | Temp 97.0°F | Resp 14 | Wt 109.0 lb

## 2022-09-12 DIAGNOSIS — R300 Dysuria: Secondary | ICD-10-CM | POA: Diagnosis not present

## 2022-09-12 DIAGNOSIS — R1032 Left lower quadrant pain: Secondary | ICD-10-CM | POA: Diagnosis not present

## 2022-09-12 DIAGNOSIS — K5732 Diverticulitis of large intestine without perforation or abscess without bleeding: Secondary | ICD-10-CM | POA: Diagnosis not present

## 2022-09-12 DIAGNOSIS — N3090 Cystitis, unspecified without hematuria: Secondary | ICD-10-CM | POA: Diagnosis not present

## 2022-09-12 DIAGNOSIS — K529 Noninfective gastroenteritis and colitis, unspecified: Secondary | ICD-10-CM | POA: Diagnosis not present

## 2022-09-12 DIAGNOSIS — N281 Cyst of kidney, acquired: Secondary | ICD-10-CM | POA: Diagnosis not present

## 2022-09-12 DIAGNOSIS — I700 Atherosclerosis of aorta: Secondary | ICD-10-CM | POA: Diagnosis not present

## 2022-09-12 LAB — POC URINALSYSI DIPSTICK (AUTOMATED)
Bilirubin, UA: NEGATIVE
Glucose, UA: NEGATIVE
Ketones, UA: NEGATIVE
Nitrite, UA: NEGATIVE
Protein, UA: POSITIVE — AB
Spec Grav, UA: 1.01 (ref 1.010–1.025)
Urobilinogen, UA: 0.2 E.U./dL
pH, UA: 5.5 (ref 5.0–8.0)

## 2022-09-12 LAB — URINALYSIS, MICROSCOPIC ONLY

## 2022-09-12 LAB — CBC
HCT: 35 % — ABNORMAL LOW (ref 36.0–46.0)
Hemoglobin: 11.9 g/dL — ABNORMAL LOW (ref 12.0–15.0)
MCHC: 34 g/dL (ref 30.0–36.0)
MCV: 92.4 fl (ref 78.0–100.0)
Platelets: 345 10*3/uL (ref 150.0–400.0)
RBC: 3.79 Mil/uL — ABNORMAL LOW (ref 3.87–5.11)
RDW: 14.2 % (ref 11.5–15.5)
WBC: 7.3 10*3/uL (ref 4.0–10.5)

## 2022-09-12 MED ORDER — IOPAMIDOL (ISOVUE-300) INJECTION 61%
100.0000 mL | Freq: Once | INTRAVENOUS | Status: AC | PRN
  Administered 2022-09-12: 100 mL via INTRAVENOUS

## 2022-09-12 MED ORDER — AMOXICILLIN-POT CLAVULANATE 875-125 MG PO TABS: 1.0000 | ORAL_TABLET | Freq: Two times a day (BID) | ORAL | 0 refills | Status: AC

## 2022-09-12 NOTE — Assessment & Plan Note (Signed)
UA in office. 

## 2022-09-12 NOTE — Patient Instructions (Signed)
Nice to see you today I will be in touch with the labs once I have the results of the scan and labs I am going to go ahead and send over an antibiotic that will cover both UTI and possible Diverticulitis

## 2022-09-12 NOTE — Assessment & Plan Note (Signed)
Concerning for acute diverticulitis.  Patient has a strong family history of such.  States she has had a colonoscopy in the past but I am not able to review any of her past colonoscopies as it were performed in [place].  Patient does not know if she had any diverticula on exam.  Will obtain stat CT scan of abdomen pelvis to rule out diverticular disease she also has contralateral pain on the right lower quadrant this could be a complication.  Go ahead and treat with Augmentin 875-125 mg for 7 days.  Pending CT scan result along with basic blood work.

## 2022-09-12 NOTE — Assessment & Plan Note (Signed)
UA indicative of urinary tract infection.  We will send off urine for culture, pending result.  Elect to treat with Augmentin 875-125.  For dual coverage of possible diverticular disease and UTI

## 2022-09-12 NOTE — Progress Notes (Signed)
Acute Office Visit  Subjective:     Patient ID: Kim Braun, female    DOB: 1961-02-05, 61 y.o.   MRN: [id number]  Chief Complaint  Patient presents with   Abdominal Pain    LLQ pain, about a week ago on 09/05/22 started having a lot of diarrhea, took Immodium and diarrhea improved-then had constipation for 3 days. Did have bowel movement this morning. LLQ pain started on 09/05/22 also and got worse and intense by the 09/10/22.     Abdominal Pain Associated symptoms include diarrhea and dysuria. Pertinent negatives include no fever, hematuria, nausea or vomiting.   Patient is in today for Abdominal pain  Patient with a history of congestive dilated cardiomyopathy, hypertension, emphysema, RA, osteoporosis, tobacco user presents for evaluation of abdominal pain.  Started on 09/05/2022. States that her Belinda Block was bothering her and diarrhea. States that she took 2 immodiums with no releif and took a 3rd and it worked. States that she has been having a soreness on the LLQ. States progressilly worse. States that she did have chil. Taking in fluids.   States that it has improved some since the weekend. States that it felt like a sharp pain that was all the time regardless of what she did. Has not taken otc treatment  Sharp pain and sore to the touch     Review of Systems  Constitutional:  Positive for chills. Negative for fever.  Respiratory:  Negative for shortness of breath.   Cardiovascular:  Negative for chest pain.  Gastrointestinal:  Positive for abdominal pain and diarrhea. Negative for nausea and vomiting.       Bm this morning  loose  Genitourinary:  Positive for dysuria. Negative for hematuria.        Objective:    BP 124/72   Pulse 92   Temp (!) 97 F (36.1 C) (Temporal)   Resp 14   Wt 109 lb (49.4 kg)   LMP 12/03/2012   SpO2 99%   BMI 19.46 kg/m    Physical Exam Vitals and nursing note reviewed.  Constitutional:      Appearance: She is  well-developed.  Cardiovascular:     Rate and Rhythm: Normal rate and regular rhythm.     Heart sounds: Normal heart sounds.  Pulmonary:     Effort: Pulmonary effort is normal.     Breath sounds: Normal breath sounds.  Abdominal:     General: Bowel sounds are normal. There is no distension.     Palpations: There is no mass.     Tenderness: There is abdominal tenderness. There is no right CVA tenderness or left CVA tenderness.     Hernia: No hernia is present.    Neurological:     Mental Status: She is alert.     No results found for any visits on 09/12/22.      Assessment & Plan:   Problem List Items Addressed This Visit       Genitourinary   Cystitis    UA indicative of urinary tract infection.  We will send off urine for culture, pending result.  Elect to treat with Augmentin 875-125.  For dual coverage of possible diverticular disease and UTI      Relevant Medications   amoxicillin-clavulanate (AUGMENTIN) 875-125 MG tablet     Other   Left lower quadrant abdominal pain - Primary    Concerning for acute diverticulitis.  Patient has a strong family history of such.  States she has had a  colonoscopy in the past but I am not able to review any of her past colonoscopies as it were performed in [place].  Patient does not know if she had any diverticula on exam.  Will obtain stat CT scan of abdomen pelvis to rule out diverticular disease she also has contralateral pain on the right lower quadrant this could be a complication.  Go ahead and treat with Augmentin 875-125 mg for 7 days.  Pending CT scan result along with basic blood work.      Relevant Medications   amoxicillin-clavulanate (AUGMENTIN) 875-125 MG tablet   Other Relevant Orders   POCT Urinalysis Dipstick (Automated)   Urinalysis, microscopic only   Urine Culture   CT Abdomen Pelvis W Contrast   CBC   Basic metabolic panel   Dysuria    UA in office.      Relevant Orders   Urinalysis, microscopic only    Urine Culture    Meds ordered this encounter  Medications   amoxicillin-clavulanate (AUGMENTIN) 875-125 MG tablet    Sig: Take 1 tablet by mouth 2 (two) times daily for 7 days.    Dispense:  14 tablet    Refill:  0    Order Specific Question:   Supervising Provider    Answer:   TOWER, MARNE A [[id number]    Return if symptoms worsen or fail to improve.  Romilda Garret, NP

## 2022-09-12 NOTE — Addendum Note (Signed)
Addended by: Lerry Liner on: 09/12/2022 03:14 PM   Modules accepted: Orders

## 2022-09-13 ENCOUNTER — Other Ambulatory Visit (INDEPENDENT_AMBULATORY_CARE_PROVIDER_SITE_OTHER): Payer: PPO

## 2022-09-13 DIAGNOSIS — R1032 Left lower quadrant pain: Secondary | ICD-10-CM

## 2022-09-13 LAB — BASIC METABOLIC PANEL
BUN: 6 mg/dL (ref 6–23)
CO2: 25 mEq/L (ref 19–32)
Calcium: 9.2 mg/dL (ref 8.4–10.5)
Chloride: 95 mEq/L — ABNORMAL LOW (ref 96–112)
Creatinine, Ser: 0.69 mg/dL (ref 0.40–1.20)
GFR: 92.16 mL/min (ref >60.00)
Glucose, Bld: 274 mg/dL — ABNORMAL HIGH (ref 70–99)
Potassium: 3.5 mEq/L (ref 3.5–5.1)
Sodium: 132 mEq/L — ABNORMAL LOW (ref 135–145)

## 2022-09-14 DIAGNOSIS — J449 Chronic obstructive pulmonary disease, unspecified: Secondary | ICD-10-CM | POA: Diagnosis not present

## 2022-09-15 LAB — URINE CULTURE
MICRO NUMBER:: 14055532
SPECIMEN QUALITY:: ADEQUATE

## 2022-09-23 ENCOUNTER — Ambulatory Visit (INDEPENDENT_AMBULATORY_CARE_PROVIDER_SITE_OTHER): Payer: PPO | Admitting: Family Medicine

## 2022-09-23 ENCOUNTER — Encounter: Payer: Self-pay | Admitting: Family Medicine

## 2022-09-23 VITALS — BP 122/64 | HR 98 | Temp 98.4°F | Ht 62.75 in | Wt 107.1 lb

## 2022-09-23 DIAGNOSIS — R7303 Prediabetes: Secondary | ICD-10-CM | POA: Insufficient documentation

## 2022-09-23 DIAGNOSIS — Z789 Other specified health status: Secondary | ICD-10-CM

## 2022-09-23 DIAGNOSIS — J432 Centrilobular emphysema: Secondary | ICD-10-CM | POA: Diagnosis not present

## 2022-09-23 DIAGNOSIS — R739 Hyperglycemia, unspecified: Secondary | ICD-10-CM

## 2022-09-23 DIAGNOSIS — F17200 Nicotine dependence, unspecified, uncomplicated: Secondary | ICD-10-CM | POA: Diagnosis not present

## 2022-09-23 LAB — POCT GLYCOSYLATED HEMOGLOBIN (HGB A1C): Hemoglobin A1C: 5.9 % — AB (ref 4.0–5.6)

## 2022-09-23 MED ORDER — BETAMETHASONE DIPROPIONATE 0.05 % EX CREA: TOPICAL_CREAM | Freq: Two times a day (BID) | CUTANEOUS | 0 refills | Status: DC

## 2022-09-23 MED ORDER — NICOTINE 21 MG/24HR TD PT24: 21.0000 mg | MEDICATED_PATCH | Freq: Every day | TRANSDERMAL | 0 refills | Status: AC

## 2022-09-23 NOTE — Assessment & Plan Note (Addendum)
Encouraged full cessation. Reviewed risks of ongoing smoking including adverse lung effect.  [place] quitline # provided.  She is interested in trying nicoderm CQ - '21mg'$  dose sent to pharmacy for a month. rec stop cigarettes when using patch. Update if effective to continue titration.

## 2022-09-23 NOTE — Progress Notes (Signed)
Patient ID: Kim Braun, female    DOB: 1961/10/22, 61 y.o.   MRN: [id number]  This visit was conducted in person.  BP 122/64   Pulse 98   Temp 98.4 F (36.9 C) (Temporal)   Ht 5' 2.75" (1.594 m)   Wt 107 lb 2 oz (48.6 kg)   LMP 12/03/2012   SpO2 97%   BMI 19.13 kg/m    CC: elevated blood sugars  Subjective:   HPI: Kim Braun is a 61 y.o. female presenting on 09/23/2022 for Elevated Blood Sugar (C/o recent elevated BS readings. )   Recently seen by [facility] with abd discomfort, incidentally found to have cbg 274. CT at that time showed sigmoid diverticulitis, treated with 7d augmentin course with full resolution. Urine culture also grew klebsiella UTI sensitive to augmentin.   No prior history of diabetes.  Recent surgery for plantar fasciitis (08/11/2022). She has not been on oral steroids.   She bought [facility] glucometer fasting readings averaging 86-108.  Lab Results  Component Value Date   HGBA1C 5.9 (A) 09/23/2022   She has history of gestational diabetes.  She has family history of diabetes.   She requests refill of betamethasone for dry skin.   Alcohol intake - 1.5 glasses of wine with dinner.  Smoking - 1/2 ppd.      Relevant past medical, surgical, family and social history reviewed and updated as indicated. Interim medical history since our last visit reviewed. Allergies and medications reviewed and updated. Outpatient Medications Prior to Visit  Medication Sig Dispense Refill   albuterol (PROVENTIL HFA;VENTOLIN HFA) 108 (90 BASE) MCG/ACT inhaler Inhale 2 puffs into the lungs daily as needed for wheezing or shortness of breath.     alendronate (FOSAMAX) 70 MG tablet Take 70 mg by mouth once a week.     aspirin EC 81 MG tablet Take 1 tablet (81 mg total) by mouth daily. Swallow whole. 90 tablet 3   Biotin 10 MG TABS 1 tablet     CALCIUM-VITAMIN D PO Take by mouth daily. Calcium 600 mg + vit D3     Cholecalciferol (VITAMIN D3 PO) Take 2,000 Units by  mouth.     Etanercept (ENBREL MINI) 50 MG/ML SOCT Inject into the skin.     L-Lysine 500 MG TABS Take by mouth daily.     potassium chloride (KLOR-CON M) 10 MEQ tablet Take 1 tablet (10 mEq total) by mouth daily. 90 tablet 3   rosuvastatin (CRESTOR) 10 MG tablet Take 1 tablet (10 mg total) by mouth daily. 90 tablet 3   Tiotropium Bromide Monohydrate (SPIRIVA RESPIMAT) 2.5 MCG/ACT AERS Inhale 2 puffs into the lungs daily. 4 g 6   valsartan (DIOVAN) 80 MG tablet Take 1 tablet (80 mg total) by mouth daily. 90 tablet 3   betamethasone dipropionate 0.05 % cream Apply topically 2 (two) times daily. As needed     oxyCODONE-acetaminophen (PERCOCET) 5-325 MG tablet Take 1 tablet by mouth every 4 (four) hours as needed for severe pain. 30 tablet 0   No facility-administered medications prior to visit.     Per HPI unless specifically indicated in ROS section below Review of Systems  Objective:  BP 122/64   Pulse 98   Temp 98.4 F (36.9 C) (Temporal)   Ht 5' 2.75" (1.594 m)   Wt 107 lb 2 oz (48.6 kg)   LMP 12/03/2012   SpO2 97%   BMI 19.13 kg/m   Wt Readings from Last 3  Encounters:  09/23/22 107 lb 2 oz (48.6 kg)  09/12/22 109 lb (49.4 kg)  08/03/22 109 lb (49.4 kg)      Physical Exam Vitals and nursing note reviewed.  Constitutional:      Appearance: Normal appearance.  Cardiovascular:     Rate and Rhythm: Normal rate and regular rhythm.     Pulses: Normal pulses.     Heart sounds: Normal heart sounds. No murmur heard. Pulmonary:     Effort: Pulmonary effort is normal. No respiratory distress.     Breath sounds: Normal breath sounds. No wheezing, rhonchi or rales.  Musculoskeletal:     Right lower leg: No edema.     Left lower leg: No edema.  Skin:    General: Skin is warm and dry.     Findings: No rash.  Neurological:     Mental Status: She is alert.  Psychiatric:        Mood and Affect: Mood normal.        Behavior: Behavior normal.       Results for orders placed or  performed in visit on 09/23/22  POCT glycosylated hemoglobin (Hb A1C)  Result Value Ref Range   Hemoglobin A1C 5.9 (A) 4.0 - 5.6 %   HbA1c POC (<> result, manual entry)     HbA1c, POC (prediabetic range)     HbA1c, POC (controlled diabetic range)      Assessment & Plan:   Problem List Items Addressed This Visit     Centrilobular emphysema ([place])    Denies exertional dyspnea but notes productive cough every morning. Recommend regular spiriva use - this may help improve her productive cough in the mornings.       Relevant Medications   nicotine (NICODERM CQ) 21 mg/24hr patch   Smoker    Encouraged full cessation. Reviewed risks of ongoing smoking including adverse lung effect.  [place] quitline # provided.  She is interested in trying nicoderm CQ - '21mg'$  dose sent to pharmacy for a month. rec stop cigarettes when using patch. Update if effective to continue titration.       Prediabetes - Primary    Recent cbg markedly elevated to 270s - this in setting of drinking several margaritas the night before as well as acute infection (diverticulitis and UTI) when blood was drawn.  A1c today in prediabetes range (5.9%). recommend diet controlling measures at this time. However in her personal history of gestational diabetes and strong fmhx diabetes, discussed she is at increased risk of developing diabetes if not careful.       Alcohol use    Discussed relation of alcohol use and hyperglycemia.       Other Visit Diagnoses     Elevated blood sugar       Relevant Orders   POCT glycosylated hemoglobin (Hb A1C) (Completed)        Meds ordered this encounter  Medications   betamethasone dipropionate 0.05 % cream    Sig: Apply topically 2 (two) times daily. As needed    Dispense:  30 g    Refill:  0   nicotine (NICODERM CQ) 21 mg/24hr patch    Sig: Place 1 patch (21 mg total) onto the skin daily.    Dispense:  28 patch    Refill:  0   Orders Placed This Encounter  Procedures   POCT  glycosylated hemoglobin (Hb A1C)     Patient Instructions  A1c was in prediabetes range.  Caution with added sugars, sweetened  beverages, alcohol intake as all these things can worsen diabetes control.  Price out '21mg'$  nicotine patch sent to pharmacy. 1 (800) QUIT-NOW is [place] quit line.   Follow up plan: Return if symptoms worsen or fail to improve.  Ria Bush, MD

## 2022-09-23 NOTE — Assessment & Plan Note (Signed)
Recent cbg markedly elevated to 270s - this in setting of drinking several margaritas the night before as well as acute infection (diverticulitis and UTI) when blood was drawn.  A1c today in prediabetes range (5.9%). recommend diet controlling measures at this time. However in her personal history of gestational diabetes and strong fmhx diabetes, discussed she is at increased risk of developing diabetes if not careful.

## 2022-09-23 NOTE — Assessment & Plan Note (Signed)
Denies exertional dyspnea but notes productive cough every morning. Recommend regular spiriva use - this may help improve her productive cough in the mornings.

## 2022-09-23 NOTE — Patient Instructions (Addendum)
A1c was in prediabetes range.  Caution with added sugars, sweetened beverages, alcohol intake as all these things can worsen diabetes control.  Price out '21mg'$  nicotine patch sent to pharmacy. 1 (800) QUIT-NOW is [place] quit line.

## 2022-09-23 NOTE — Assessment & Plan Note (Signed)
Discussed relation of alcohol use and hyperglycemia.

## 2022-09-28 ENCOUNTER — Telehealth: Payer: Self-pay | Admitting: Family Medicine

## 2022-09-28 MED ORDER — METRONIDAZOLE 500 MG PO TABS: 500.0000 mg | ORAL_TABLET | Freq: Three times a day (TID) | ORAL | 0 refills | Status: AC

## 2022-09-28 MED ORDER — CIPROFLOXACIN HCL 500 MG PO TABS: 500.0000 mg | ORAL_TABLET | Freq: Two times a day (BID) | ORAL | 0 refills | Status: AC

## 2022-09-28 NOTE — Telephone Encounter (Signed)
Patient stated that she woke up this morning with diarrhea and abdominal pain. Patient stated that she has had chills and temperature is 100.2. Kim KitchenPatient stated that she was not feeling good yesterday. Leaving to go out of state tomorrow..  Patient stated that she feels that she needs an antibiotic and Flagyl.  [facility]

## 2022-09-28 NOTE — Telephone Encounter (Signed)
I saw her on Friday, she was not having GI symptoms at that time. Can we call and see when symptoms started and what symptoms she's currently having?  Recommend first line bowel rest at onset of diverticulitis symptoms - for 1-2 days advancing to clear liquid diet and often this can help resolve diverticulitis exacerbation.  Plz get above info then we can consider rpt abx treatment.

## 2022-09-28 NOTE — Telephone Encounter (Signed)
Patient called in to follow up on this request. She was wanting to know if Flagyl could be sent in for the diarrhea with the antibiotic. Thank you!

## 2022-09-28 NOTE — Telephone Encounter (Signed)
Patient notified as instructed by telephone and verbalized understanding. Patient was given ER precautions and verbalized understanding.

## 2022-09-28 NOTE — Telephone Encounter (Signed)
Patient called in and stated she seen Matt a few weeks ago for Diverticulitis flare up and was prescribed some Augmentin 775. She stated she is experiencing another flare up and was wondering if another prescription could be sent it. She will be traveling this week. Please advise. Thank you!

## 2022-09-28 NOTE — Telephone Encounter (Signed)
Still recommend clear liquid diet for next several days.  I have sent cipro/flagyl 10 day course for her to take for recurrent diverticulitis. Please give ER precautions for any worsening symptoms. If not fully better or again recurrent symptoms after this, will need OV for further evaluation.   She cannot take alcohol with flagyl as it will make her very sick.

## 2022-10-13 DIAGNOSIS — M0579 Rheumatoid arthritis with rheumatoid factor of multiple sites without organ or systems involvement: Secondary | ICD-10-CM | POA: Diagnosis not present

## 2022-10-13 DIAGNOSIS — M797 Fibromyalgia: Secondary | ICD-10-CM | POA: Diagnosis not present

## 2022-10-13 DIAGNOSIS — R7989 Other specified abnormal findings of blood chemistry: Secondary | ICD-10-CM | POA: Diagnosis not present

## 2022-10-13 DIAGNOSIS — M1990 Unspecified osteoarthritis, unspecified site: Secondary | ICD-10-CM | POA: Diagnosis not present

## 2022-10-13 DIAGNOSIS — Z79899 Other long term (current) drug therapy: Secondary | ICD-10-CM | POA: Diagnosis not present

## 2022-10-13 DIAGNOSIS — Z23 Encounter for immunization: Secondary | ICD-10-CM | POA: Diagnosis not present

## 2022-10-13 DIAGNOSIS — M79643 Pain in unspecified hand: Secondary | ICD-10-CM | POA: Diagnosis not present

## 2022-10-13 DIAGNOSIS — M810 Age-related osteoporosis without current pathological fracture: Secondary | ICD-10-CM | POA: Diagnosis not present

## 2022-10-15 DIAGNOSIS — J449 Chronic obstructive pulmonary disease, unspecified: Secondary | ICD-10-CM | POA: Diagnosis not present

## 2022-10-24 ENCOUNTER — Telehealth: Payer: Self-pay | Admitting: Family Medicine

## 2022-10-24 DIAGNOSIS — K5792 Diverticulitis of intestine, part unspecified, without perforation or abscess without bleeding: Secondary | ICD-10-CM

## 2022-10-24 DIAGNOSIS — R197 Diarrhea, unspecified: Secondary | ICD-10-CM

## 2022-10-24 NOTE — Telephone Encounter (Signed)
Patient would like to know if a referral can be sent out to a gastroenterologist? She was seen on 09/12/22 for stomach issues,and was prescribed a rx,which is not helping,and would like to see a specialist.

## 2022-10-24 NOTE — Telephone Encounter (Addendum)
Recommend she come in for stool test x2 to r/o C diff or other infection - ordered.  GI referral placed. No location preference.

## 2022-10-24 NOTE — Addendum Note (Signed)
Addended by: Ria Bush on: 10/24/2022 03:20 PM   Modules accepted: Orders

## 2022-10-24 NOTE — Telephone Encounter (Signed)
Please call for more info.   Seen in office 09/12/2022 treated with augmentin 7d course for CT confirmed proximal sigmoid diverticulitis with full resolution.  Treated over phone 09/28/2022 with 10d cipro/flagyl course. At that time rec OV if no better.  Did she improve with cipro/flagyl?  I can place GI referral but it can take weeks to months to get in to see them. I recommend OV asap for further evaluation as recommended at last phone call, may need labs vs reimaging, needs sooner evaluation.

## 2022-10-24 NOTE — Telephone Encounter (Signed)
Patient advised. Patient will come by for stool test.

## 2022-10-24 NOTE — Telephone Encounter (Signed)
Spoke with patient. Patient states she continues to have diarrhea with solid and liquid food. Her stomach hurts and cramps the more diarrhea she has a day. No vomiting. Last fever was on 09/28/22. She finished Flagyl and Cipro and states Cipro gave her migraine but she finished it. Yesterday felt better. She just keeps having recurrence with this. Discussed how long it can take to see GI and making an appointment here in the meantime. Patient states if she starts feeling bad or worse she will call and make an appointment but declined doing so right now since this is not new issue. She will keep [place] updated. She does not have a preference on location for GI

## 2022-10-27 ENCOUNTER — Other Ambulatory Visit: Payer: Self-pay

## 2022-10-27 DIAGNOSIS — R197 Diarrhea, unspecified: Secondary | ICD-10-CM | POA: Diagnosis not present

## 2022-10-28 LAB — C. DIFFICILE GDH AND TOXIN A/B
GDH ANTIGEN: DETECTED
MICRO NUMBER:: 14252315
SPECIMEN QUALITY:: ADEQUATE
TOXIN A AND B: DETECTED

## 2022-10-29 ENCOUNTER — Encounter: Payer: Self-pay | Admitting: Family Medicine

## 2022-10-29 ENCOUNTER — Other Ambulatory Visit: Payer: Self-pay | Admitting: Family Medicine

## 2022-10-29 DIAGNOSIS — A0472 Enterocolitis due to Clostridium difficile, not specified as recurrent: Secondary | ICD-10-CM | POA: Insufficient documentation

## 2022-10-29 HISTORY — DX: Enterocolitis due to Clostridium difficile, not specified as recurrent: A04.72

## 2022-10-29 MED ORDER — VANCOMYCIN HCL 125 MG PO CAPS: 125.0000 mg | ORAL_CAPSULE | Freq: Four times a day (QID) | ORAL | 0 refills | Status: AC

## 2022-10-31 ENCOUNTER — Telehealth: Payer: Self-pay

## 2022-10-31 LAB — GASTROINTESTINAL PATHOGEN PNL

## 2022-10-31 NOTE — Telephone Encounter (Signed)
[place] Night - Client TELEPHONE ADVICE RECORD [facility] Patient Name: Kim Braun Gender: Female DOB: 01/04/1961 Age: 61 Y 70 M 7 D Return Phone Number: [phone] (Primary) Address[place]  [address] Client [place] Night - Client Client Site [place] Provider Ria Bush - MD Contact Type Call Who Is Calling Lab / Radiology Lab Name [facility] Lab Phone Number [phone] Lab Tech Name [place] [facility] Reference Number [id number] Y Chief Complaint Lab Result (Critical or Stat) Call Type Lab Send to RN Reason for Call Report lab results Initial Comment Caller states she is Col[place]ta [place] at [employer] calling to report a critical lab for a pt. Translation No Nurse Assessment Nurse: Alvis Lemmings, RN, Marcie Bal Date/Time Eilene Ghazi Time): 10/29/2022 9:26:45 AM Is there an on-call provider listed? ---Yes Please list name of person reporting value (Lab Employee) and a contact number. ---[facility] Please document the following items: Lab name Lab value (read back to lab to verify) Reference range for lab value Date and time blood was drawn Collect time of birth for bilirubin results ---[birth year] collected at 10:12 a.m. C diffDETECTED Disp. Time Eilene Ghazi Time) Disposition Final User 10/29/2022 9:36:40 AM Called On-Call Provider Alvis Lemmings, RN, Marcie Bal 10/29/2022 9:38:05 AM Clinical Call Yes Alvis Lemmings, RN, Marcie Bal 10/29/2022 9:38:24 AM Lab Call Alvis Lemmings, RN, Marcie Bal Reason: in app filled out 10/29/2022 9:38:30 AM Call Completed Alvis Lemmings, RN, Marcie Bal Final Disposition 10/29/2022 9:38:05 AM Clinical Call Yes Alvis Lemmings, RN, Sherrie Sport NOTE: All timestamps contained within this report are represented as [place] Standard Time. CONFIDENTIALTY NOTICE: This fax transmission is intended only for the addressee. It contains information that is legally privileged, confidential  or otherwise protected from use or disclosure. If you are not the intended recipient, you are strictly prohibited from reviewing, disclosing, copying using or disseminating any of this information or taking any action in reliance on or regarding this information. If you have received this fax in error, please notify us immediately by telephone so that we can arrange for its return to [place]. Phone: [phone], Toll-Free: [phone], Fax: [phone] Page: 2 of 2 Call Id: [id number] Paging DoctorName Phone DateTime Result/ Outcome Message Type Notes Dimas Chyle- MD [phone] 10/29/2022 9:36:40 AM Called On Call Provider - Reached Doctor Paged Dimas Chyle- MD 10/29/2022 9:37:56 AM Spoke with On Call - Outcome Notification Message Result Notified of C diff. detection. States office will f/u with pt Monday. D/n feel urgent at this time

## 2022-10-31 NOTE — Telephone Encounter (Signed)
Patient notified as instructed by telephone. Patient stated that she picked the medication up today and has started it.

## 2022-10-31 NOTE — Telephone Encounter (Signed)
See result note. Plz check in with patient to ensure she started medication.

## 2022-10-31 NOTE — Telephone Encounter (Signed)
Please see 10/27/22  lab result notes. Sending to Dr Darnell Level and G pool.

## 2022-11-11 ENCOUNTER — Ambulatory Visit: Payer: PPO | Admitting: Podiatry

## 2022-11-14 DIAGNOSIS — J449 Chronic obstructive pulmonary disease, unspecified: Secondary | ICD-10-CM | POA: Diagnosis not present

## 2022-11-22 ENCOUNTER — Other Ambulatory Visit: Payer: Self-pay | Admitting: Cardiovascular Disease

## 2022-11-29 ENCOUNTER — Other Ambulatory Visit: Payer: Self-pay | Admitting: Family Medicine

## 2022-12-02 NOTE — Telephone Encounter (Signed)
Betamethasone cream Last filled: 09/23/22, #30 g Last OV: 09/23/22, elevated BS Next OV: 04/07/23, CPE

## 2022-12-15 DIAGNOSIS — J449 Chronic obstructive pulmonary disease, unspecified: Secondary | ICD-10-CM | POA: Diagnosis not present

## 2023-01-15 DIAGNOSIS — J449 Chronic obstructive pulmonary disease, unspecified: Secondary | ICD-10-CM | POA: Diagnosis not present

## 2023-02-03 DIAGNOSIS — J449 Chronic obstructive pulmonary disease, unspecified: Secondary | ICD-10-CM | POA: Diagnosis not present

## 2023-02-13 DIAGNOSIS — J449 Chronic obstructive pulmonary disease, unspecified: Secondary | ICD-10-CM | POA: Diagnosis not present

## 2023-02-24 ENCOUNTER — Ambulatory Visit
Admission: RE | Admit: 2023-02-24 | Discharge: 2023-02-24 | Disposition: A | Discharge status: Home / Self Care | Payer: PPO | Source: Ambulatory Visit | Attending: Family Medicine | Admitting: Family Medicine

## 2023-02-24 DIAGNOSIS — Z87891 Personal history of nicotine dependence: Secondary | ICD-10-CM | POA: Diagnosis not present

## 2023-02-24 DIAGNOSIS — F17210 Nicotine dependence, cigarettes, uncomplicated: Secondary | ICD-10-CM | POA: Insufficient documentation

## 2023-02-27 ENCOUNTER — Encounter: Payer: Self-pay | Admitting: Family Medicine

## 2023-02-27 ENCOUNTER — Other Ambulatory Visit: Payer: Self-pay | Admitting: Acute Care

## 2023-02-27 DIAGNOSIS — I2510 Atherosclerotic heart disease of native coronary artery without angina pectoris: Secondary | ICD-10-CM | POA: Insufficient documentation

## 2023-02-27 DIAGNOSIS — Z122 Encounter for screening for malignant neoplasm of respiratory organs: Secondary | ICD-10-CM

## 2023-02-27 DIAGNOSIS — Z87891 Personal history of nicotine dependence: Secondary | ICD-10-CM

## 2023-02-27 DIAGNOSIS — F17210 Nicotine dependence, cigarettes, uncomplicated: Secondary | ICD-10-CM

## 2023-03-16 DIAGNOSIS — J449 Chronic obstructive pulmonary disease, unspecified: Secondary | ICD-10-CM | POA: Diagnosis not present

## 2023-03-21 ENCOUNTER — Ambulatory Visit: Payer: PPO | Admitting: Gastroenterology

## 2023-03-23 ENCOUNTER — Other Ambulatory Visit: Payer: Self-pay | Admitting: Podiatry

## 2023-03-23 ENCOUNTER — Other Ambulatory Visit: Payer: Self-pay | Admitting: Family Medicine

## 2023-03-23 ENCOUNTER — Other Ambulatory Visit: Payer: Self-pay | Admitting: Cardiovascular Disease

## 2023-03-23 DIAGNOSIS — I10 Essential (primary) hypertension: Secondary | ICD-10-CM

## 2023-03-23 NOTE — Telephone Encounter (Signed)
Please schedule overdue F/U appointment (next available) for 90 day refills. Thank you!

## 2023-03-27 ENCOUNTER — Telehealth: Payer: Self-pay | Admitting: Family Medicine

## 2023-03-27 DIAGNOSIS — Z8349 Family history of other endocrine, nutritional and metabolic diseases: Secondary | ICD-10-CM

## 2023-03-27 NOTE — Telephone Encounter (Signed)
Pt called in requesting to get her iron check because of  her family history . Please advise [phone]

## 2023-03-27 NOTE — Addendum Note (Signed)
Addended by: Eustaquio Boyden on: 03/27/2023 03:09 PM   Modules accepted: Orders

## 2023-03-27 NOTE — Telephone Encounter (Signed)
Lab ordered. Plz schedule nonfasting lab visit to get checked.

## 2023-03-27 NOTE — Telephone Encounter (Signed)
Pt is scheduled on 6/11

## 2023-03-30 ENCOUNTER — Other Ambulatory Visit: Payer: Self-pay | Admitting: Family Medicine

## 2023-03-30 DIAGNOSIS — M810 Age-related osteoporosis without current pathological fracture: Secondary | ICD-10-CM

## 2023-03-30 DIAGNOSIS — Z8349 Family history of other endocrine, nutritional and metabolic diseases: Secondary | ICD-10-CM

## 2023-03-30 DIAGNOSIS — E041 Nontoxic single thyroid nodule: Secondary | ICD-10-CM

## 2023-03-30 DIAGNOSIS — M069 Rheumatoid arthritis, unspecified: Secondary | ICD-10-CM

## 2023-03-30 DIAGNOSIS — E7800 Pure hypercholesterolemia, unspecified: Secondary | ICD-10-CM

## 2023-03-30 DIAGNOSIS — R7303 Prediabetes: Secondary | ICD-10-CM

## 2023-03-30 NOTE — Addendum Note (Signed)
Addended by: Eustaquio Boyden on: 03/30/2023 09:46 AM   Modules accepted: Orders

## 2023-03-31 ENCOUNTER — Other Ambulatory Visit (INDEPENDENT_AMBULATORY_CARE_PROVIDER_SITE_OTHER): Payer: PPO

## 2023-03-31 DIAGNOSIS — Z8349 Family history of other endocrine, nutritional and metabolic diseases: Secondary | ICD-10-CM | POA: Diagnosis not present

## 2023-03-31 DIAGNOSIS — M069 Rheumatoid arthritis, unspecified: Secondary | ICD-10-CM | POA: Diagnosis not present

## 2023-03-31 DIAGNOSIS — E7800 Pure hypercholesterolemia, unspecified: Secondary | ICD-10-CM | POA: Diagnosis not present

## 2023-03-31 DIAGNOSIS — M810 Age-related osteoporosis without current pathological fracture: Secondary | ICD-10-CM | POA: Diagnosis not present

## 2023-03-31 DIAGNOSIS — E041 Nontoxic single thyroid nodule: Secondary | ICD-10-CM | POA: Diagnosis not present

## 2023-03-31 DIAGNOSIS — R7303 Prediabetes: Secondary | ICD-10-CM

## 2023-03-31 LAB — IBC PANEL
Iron: 98 ug/dL (ref 42–145)
Saturation Ratios: 34 % (ref 20.0–50.0)
TIBC: 288.4 ug/dL (ref 250.0–450.0)
Transferrin: 206 mg/dL — ABNORMAL LOW (ref 212.0–360.0)

## 2023-03-31 LAB — CBC WITH DIFFERENTIAL/PLATELET
Basophils Absolute: 0 10*3/uL (ref 0.0–0.1)
Basophils Relative: 0.8 % (ref 0.0–3.0)
Eosinophils Absolute: 0.1 10*3/uL (ref 0.0–0.7)
Eosinophils Relative: 1.4 % (ref 0.0–5.0)
HCT: 39.4 % (ref 36.0–46.0)
Hemoglobin: 13.2 g/dL (ref 12.0–15.0)
Lymphocytes Relative: 24.9 % (ref 12.0–46.0)
Lymphs Abs: 1.5 10*3/uL (ref 0.7–4.0)
MCHC: 33.6 g/dL (ref 30.0–36.0)
MCV: 93.9 fl (ref 78.0–100.0)
Monocytes Absolute: 0.8 10*3/uL (ref 0.1–1.0)
Monocytes Relative: 13.6 % — ABNORMAL HIGH (ref 3.0–12.0)
Neutro Abs: 3.5 10*3/uL (ref 1.4–7.7)
Neutrophils Relative %: 59.3 % (ref 43.0–77.0)
Platelets: 236 10*3/uL (ref 150.0–400.0)
RBC: 4.2 Mil/uL (ref 3.87–5.11)
RDW: 14.5 % (ref 11.5–15.5)
WBC: 5.9 10*3/uL (ref 4.0–10.5)

## 2023-03-31 LAB — LIPID PANEL
Cholesterol: 196 mg/dL (ref 0–200)
HDL: 104.7 mg/dL (ref >39.00)
LDL Cholesterol: 80 mg/dL (ref 0–99)
NonHDL: 91.77
Total CHOL/HDL Ratio: 2
Triglycerides: 58 mg/dL (ref 0.0–149.0)
VLDL: 11.6 mg/dL (ref 0.0–40.0)

## 2023-03-31 LAB — HEMOGLOBIN A1C: Hgb A1c MFr Bld: 5.9 % (ref 4.6–6.5)

## 2023-03-31 LAB — COMPREHENSIVE METABOLIC PANEL
ALT: 27 U/L (ref 0–35)
AST: 42 U/L — ABNORMAL HIGH (ref 0–37)
Albumin: 4.1 g/dL (ref 3.5–5.2)
Alkaline Phosphatase: 62 U/L (ref 39–117)
BUN: 8 mg/dL (ref 6–23)
CO2: 28 mEq/L (ref 19–32)
Calcium: 9.4 mg/dL (ref 8.4–10.5)
Chloride: 99 mEq/L (ref 96–112)
Creatinine, Ser: 0.63 mg/dL (ref 0.40–1.20)
GFR: 93.85 mL/min (ref >60.00)
Glucose, Bld: 99 mg/dL (ref 70–99)
Potassium: 4.1 mEq/L (ref 3.5–5.1)
Sodium: 136 mEq/L (ref 135–145)
Total Bilirubin: 1.1 mg/dL (ref 0.2–1.2)
Total Protein: 7 g/dL (ref 6.0–8.3)

## 2023-03-31 LAB — VITAMIN D 25 HYDROXY (VIT D DEFICIENCY, FRACTURES): VITD: 57.87 ng/mL (ref 30.00–100.00)

## 2023-03-31 LAB — FERRITIN: Ferritin: 277 ng/mL (ref 10.0–291.0)

## 2023-03-31 LAB — TSH: TSH: 0.87 u[IU]/mL (ref 0.35–5.50)

## 2023-04-03 ENCOUNTER — Other Ambulatory Visit: Payer: Self-pay | Admitting: Family Medicine

## 2023-04-03 ENCOUNTER — Encounter: Payer: Self-pay | Admitting: Family Medicine

## 2023-04-03 DIAGNOSIS — M797 Fibromyalgia: Secondary | ICD-10-CM | POA: Insufficient documentation

## 2023-04-03 DIAGNOSIS — Z0000 Encounter for general adult medical examination without abnormal findings: Secondary | ICD-10-CM

## 2023-04-04 ENCOUNTER — Ambulatory Visit
Admission: RE | Admit: 2023-04-04 | Discharge: 2023-04-04 | Disposition: A | Discharge status: Home / Self Care | Payer: PPO | Source: Ambulatory Visit | Attending: Family Medicine | Admitting: Family Medicine

## 2023-04-04 DIAGNOSIS — Z1231 Encounter for screening mammogram for malignant neoplasm of breast: Secondary | ICD-10-CM | POA: Diagnosis not present

## 2023-04-04 DIAGNOSIS — Z0000 Encounter for general adult medical examination without abnormal findings: Secondary | ICD-10-CM

## 2023-04-07 ENCOUNTER — Ambulatory Visit (INDEPENDENT_AMBULATORY_CARE_PROVIDER_SITE_OTHER): Payer: PPO | Admitting: Family Medicine

## 2023-04-07 ENCOUNTER — Encounter: Payer: Self-pay | Admitting: Family Medicine

## 2023-04-07 VITALS — BP 138/82 | HR 78 | Temp 97.4°F | Ht 61.75 in | Wt 106.1 lb

## 2023-04-07 DIAGNOSIS — I2510 Atherosclerotic heart disease of native coronary artery without angina pectoris: Secondary | ICD-10-CM

## 2023-04-07 DIAGNOSIS — I700 Atherosclerosis of aorta: Secondary | ICD-10-CM

## 2023-04-07 DIAGNOSIS — Z0000 Encounter for general adult medical examination without abnormal findings: Secondary | ICD-10-CM | POA: Diagnosis not present

## 2023-04-07 DIAGNOSIS — M810 Age-related osteoporosis without current pathological fracture: Secondary | ICD-10-CM

## 2023-04-07 DIAGNOSIS — Z789 Other specified health status: Secondary | ICD-10-CM

## 2023-04-07 DIAGNOSIS — J432 Centrilobular emphysema: Secondary | ICD-10-CM

## 2023-04-07 DIAGNOSIS — R9230 Dense breasts, unspecified: Secondary | ICD-10-CM

## 2023-04-07 DIAGNOSIS — Z1211 Encounter for screening for malignant neoplasm of colon: Secondary | ICD-10-CM

## 2023-04-07 DIAGNOSIS — Z7189 Other specified counseling: Secondary | ICD-10-CM

## 2023-04-07 DIAGNOSIS — I10 Essential (primary) hypertension: Secondary | ICD-10-CM

## 2023-04-07 DIAGNOSIS — R7303 Prediabetes: Secondary | ICD-10-CM

## 2023-04-07 DIAGNOSIS — E7800 Pure hypercholesterolemia, unspecified: Secondary | ICD-10-CM

## 2023-04-07 DIAGNOSIS — Z8349 Family history of other endocrine, nutritional and metabolic diseases: Secondary | ICD-10-CM

## 2023-04-07 DIAGNOSIS — F17200 Nicotine dependence, unspecified, uncomplicated: Secondary | ICD-10-CM

## 2023-04-07 DIAGNOSIS — M069 Rheumatoid arthritis, unspecified: Secondary | ICD-10-CM

## 2023-04-07 NOTE — Assessment & Plan Note (Signed)
Preventative protocols reviewed and updated unless pt declined. Discussed healthy diet and lifestyle.  

## 2023-04-07 NOTE — Assessment & Plan Note (Addendum)

## 2023-04-07 NOTE — Assessment & Plan Note (Signed)
Advanced directive discussion - doesn't have this at home. Husband Stephen would be HCPOA. Doesn't want prolonged life support if terminal condition. Packet provided previously and again today.  ?

## 2023-04-07 NOTE — Patient Instructions (Addendum)
Pass by lab to pick up stool kit.  Check with pharmacy about RSV shot.  Work on advanced directive Schedule appointment with Dr Dion Body at [facility] Good to see you today Return as needed or in 1 year for next physical

## 2023-04-07 NOTE — Progress Notes (Unsigned)
Ph: [phone]       Fax: [phone]   Patient ID: Kim Braun, female    DOB: [birth year], 62 y.o.   MRN: [id number]  This visit was conducted in person.  BP 138/82   Pulse 78   Temp (!) 97.4 F (36.3 C) (Temporal)   Ht 5' 1.75" (1.568 m)   Wt 106 lb 2 oz (48.1 kg)   LMP 12/03/2012   SpO2 99%   BMI 19.57 kg/m    CC: AMW Subjective:   HPI: Kim Braun is a 62 y.o. female presenting on 04/07/2023 for Medicare Wellness   Did not see health advisor this year.   Hearing Screening   500Hz  1000Hz  2000Hz  4000Hz   Right ear 20 40 20 0  Left ear 20 20 20  0   Vision Screening   Right eye Left eye Both eyes  Without correction     With correction 20/25 20/30 20/25     Flowsheet Row Office Visit from 04/07/2023 in [facility] [place]  PHQ-2 Total Score 0          04/07/2023   10:13 AM 03/28/2022    2:05 PM 03/29/2021   10:17 AM 03/26/2021    2:24 PM  Fall Risk   Falls in the past year? 0 0 0 0  Number falls in past yr:  0  0  Injury with Fall?  0  0  Risk for fall due to :  Medication side effect  Medication side effect  Follow up  Falls evaluation completed;Education provided;Falls prevention discussed  Falls evaluation completed;Falls prevention discussed  Larey Seat Fall 2023.   Known rheumatoid arthritis on Humira - rheumatologist is Dr Deanne Coffer.    OP - on fosamax 70mg  weekly. Last DEXA 07/2020 through rheum. Also takes calcium/vit D + vit D. Lactose intolerant - avoids dairy.   COPD - followed by pulm Dr Belia Heman. Hasn't been taking spiriva or using oxygen at night time. She didn't like spiriva capsule.    CAD - heavy 3v coronary calcification on CT scan - saw cardiology 12/2021 s/p normal stress test 12/2021.  Upcoming cardiology appt next month.    Son recently found to have hemochromatosis.   Breast cancer risk - 3.5% 76yr risk, 13.6% lifetime risk based on Breast Cancer Risk Assessment Tool.    Preventative: Colon cancer screening -  yearly [facility]B [facility]. Fmhx breast cancer (sister)  Well woman exam - s/p total hysterectomy (Dr Richardson Dopp), ovaries removed as well.  DEXA scan 07/2020 through Rheum on fosamax - planned rpt next visit.  Lung cancer screening - yearly, first 12/2020  Flu shot - yearly COVID vaccine Pfizer 01/2020, 02/2020, bivalent 09/2021 Td 2006  Pneumovax 2014 Shingrix - discussed. Has not had chicken pox.  RSV - discussed  Advanced directive discussion - doesn't have this at home. Husband Jeannett Senior would be HCPOA. Doesn't want prolonged life support if terminal condition. Packet provided previously and again today.  Seat belt use discussed  Sunscreen use discussed. No changing moles on skin. Sees derm yearly. S/p skin cancer removed from bottom lip several years ago. Smoker - cut down from 1/2 ppd to 5-6 /day Alcohol - beer/wine <1/day Dentist - yearly @ [facility] - due Eye exam - planning to see [place] Bowel - no constipation  Bladder - no incontinence   Lives with husband Tonny Branch: retired, was in Phot[employer] Diet: good water, fruits/vegetables daily  Relevant past medical, surgical, family and social history reviewed and updated as indicated. Interim medical history since our last visit reviewed. Allergies and medications reviewed and updated. Outpatient Medications Prior to Visit  Medication Sig Dispense Refill   albuterol (PROVENTIL HFA;VENTOLIN HFA) 108 (90 BASE) MCG/ACT inhaler Inhale 2 puffs into the lungs daily as needed for wheezing or shortness of breath.     alendronate (FOSAMAX) 70 MG tablet Take 70 mg by mouth once a week.     aspirin EC 81 MG tablet Take 1 tablet (81 mg total) by mouth daily. Swallow whole. 90 tablet 3   betamethasone dipropionate 0.05 % cream APPLY TOPICALLY 2 (TWO) TIMES DAILY AS NEEDED 30 g 0   Calcium Carbonate-Vit D-Min (CALTRATE PLUS PO) Take by mouth daily.     Etanercept (ENBREL MINI) 50 MG/ML SOCT Inject into the skin.      Multiple Vitamins-Minerals (MULTIVITAMIN ADULTS 50+) TABS Take 1 tablet by mouth daily.     nicotine (NICODERM CQ) 21 mg/24hr patch Place 1 patch (21 mg total) onto the skin daily. 28 patch 0   potassium chloride (KLOR-CON M) 10 MEQ tablet Take 1 tablet (10 mEq total) by mouth daily. 90 tablet 3   rosuvastatin (CRESTOR) 10 MG tablet TAKE 1 TABLET BY MOUTH EVERY DAY 90 tablet 0   Tiotropium Bromide Monohydrate (SPIRIVA RESPIMAT) 2.5 MCG/ACT AERS Inhale 2 puffs into the lungs daily. 4 g 6   valsartan (DIOVAN) 80 MG tablet TAKE 1 TABLET BY MOUTH EVERY DAY 90 tablet 0   Biotin 10 MG TABS 1 tablet     CALCIUM-VITAMIN D PO Take by mouth daily. Calcium 600 mg + vit D3     Cholecalciferol (VITAMIN D3 PO) Take 2,000 Units by mouth.     L-Lysine 500 MG TABS Take by mouth daily.     No facility-administered medications prior to visit.     Per HPI unless specifically indicated in ROS section below Review of Systems  Constitutional:  Negative for activity change, appetite change, chills, fatigue, fever and unexpected weight change.  HENT:  Negative for hearing loss.   Eyes:  Negative for visual disturbance.  Respiratory:  Positive for cough (occ in am). Negative for chest tightness, shortness of breath and wheezing.   Cardiovascular:  Negative for chest pain, palpitations and leg swelling.  Gastrointestinal:  Negative for abdominal distention, abdominal pain, blood in stool, constipation, diarrhea, nausea and vomiting.  Genitourinary:  Negative for difficulty urinating and hematuria.  Musculoskeletal:  Negative for arthralgias, myalgias and neck pain.  Skin:  Negative for rash.  Neurological:  Positive for headaches (pollen related). Negative for dizziness, seizures and syncope.  Hematological:  Negative for adenopathy. Does not bruise/bleed easily.  Psychiatric/Behavioral:  Negative for dysphoric mood. The patient is not nervous/anxious.     Objective:  BP 138/82   Pulse 78   Temp (!) 97.4 F  (36.3 C) (Temporal)   Ht 5' 1.75" (1.568 m)   Wt 106 lb 2 oz (48.1 kg)   LMP 12/03/2012   SpO2 99%   BMI 19.57 kg/m   Wt Readings from Last 3 Encounters:  04/07/23 106 lb 2 oz (48.1 kg)  09/23/22 107 lb 2 oz (48.6 kg)  09/12/22 109 lb (49.4 kg)      Physical Exam Vitals and nursing note reviewed.  Constitutional:      Appearance: Normal appearance. She is not ill-appearing.  HENT:     Head: Normocephalic and atraumatic.     Right Ear:  Tympanic membrane, ear canal and external ear normal. There is no impacted cerumen.     Left Ear: Tympanic membrane, ear canal and external ear normal. There is no impacted cerumen.     Nose: Nose normal.     Mouth/Throat:     Mouth: Mucous membranes are moist.     Pharynx: Oropharynx is clear. No oropharyngeal exudate or posterior oropharyngeal erythema.  Eyes:     General:        Right eye: No discharge.        Left eye: No discharge.     Extraocular Movements: Extraocular movements intact.     Conjunctiva/sclera: Conjunctivae normal.     Pupils: Pupils are equal, round, and reactive to light.  Neck:     Thyroid: No thyroid mass or thyromegaly.     Vascular: No carotid bruit.  Cardiovascular:     Rate and Rhythm: Normal rate and regular rhythm.     Pulses: Normal pulses.     Heart sounds: Normal heart sounds. No murmur heard. Pulmonary:     Effort: Pulmonary effort is normal. No respiratory distress.     Breath sounds: Normal breath sounds. No wheezing, rhonchi or rales.  Abdominal:     General: Bowel sounds are normal. There is no distension.     Palpations: Abdomen is soft. There is no mass.     Tenderness: There is no abdominal tenderness. There is no guarding or rebound.     Hernia: No hernia is present.  Musculoskeletal:     Cervical back: Normal range of motion and neck supple. No rigidity.     Right lower leg: No edema.     Left lower leg: No edema.  Lymphadenopathy:     Cervical: No cervical adenopathy.  Skin:    General:  Skin is warm and dry.     Findings: No rash.  Neurological:     General: No focal deficit present.     Mental Status: She is alert. Mental status is at baseline.     Comments:  Recall 3/3 Calculation 5/5 serial 3s  Psychiatric:        Mood and Affect: Mood normal.        Behavior: Behavior normal.       Results for orders placed or performed in visit on 03/31/23  Ferritin  Result Value Ref Range   Ferritin 277.0 10.0 - 291.0 ng/mL  IBC panel  Result Value Ref Range   Iron 98 42 - 145 ug/dL   Transferrin 161.0 (L) 212.0 - 360.0 mg/dL   Saturation Ratios 96.0 20.0 - 50.0 %   TIBC 288.4 250.0 - 450.0 mcg/dL  Comprehensive metabolic panel  Result Value Ref Range   Sodium 136 135 - 145 mEq/L   Potassium 4.1 3.5 - 5.1 mEq/L   Chloride 99 96 - 112 mEq/L   CO2 28 19 - 32 mEq/L   Glucose, Bld 99 70 - 99 mg/dL   BUN 8 6 - 23 mg/dL   Creatinine, Ser 4.54 0.40 - 1.20 mg/dL   Total Bilirubin 1.1 0.2 - 1.2 mg/dL   Alkaline Phosphatase 62 39 - 117 U/L   AST 42 (H) 0 - 37 U/L   ALT 27 0 - 35 U/L   Total Protein 7.0 6.0 - 8.3 g/dL   Albumin 4.1 3.5 - 5.2 g/dL   GFR 09.81 >19.14 mL/min   Calcium 9.4 8.4 - 10.5 mg/dL  Lipid panel  Result Value Ref Range   Cholesterol  196 0 - 200 mg/dL   Triglycerides 86.5 0.0 - 149.0 mg/dL   HDL 784.69 >62.95 mg/dL   VLDL 28.4 0.0 - 13.2 mg/dL   LDL Cholesterol 80 0 - 99 mg/dL   Total CHOL/HDL Ratio 2    NonHDL 91.77   TSH  Result Value Ref Range   TSH 0.87 0.35 - 5.50 uIU/mL  Hemoglobin A1c  Result Value Ref Range   Hgb A1c MFr Bld 5.9 4.6 - 6.5 %  CBC with Differential/Platelet  Result Value Ref Range   WBC 5.9 4.0 - 10.5 K/uL   RBC 4.20 3.87 - 5.11 Mil/uL   Hemoglobin 13.2 12.0 - 15.0 g/dL   HCT 44.0 10.2 - 72.5 %   MCV 93.9 78.0 - 100.0 fl   MCHC 33.6 30.0 - 36.0 g/dL   RDW 36.6 44.0 - 34.7 %   Platelets 236.0 150.0 - 400.0 K/uL   Neutrophils Relative % 59.3 43.0 - 77.0 %   Lymphocytes Relative 24.9 12.0 - 46.0 %   Monocytes  Relative 13.6 (H) 3.0 - 12.0 %   Eosinophils Relative 1.4 0.0 - 5.0 %   Basophils Relative 0.8 0.0 - 3.0 %   Neutro Abs 3.5 1.4 - 7.7 K/uL   Lymphs Abs 1.5 0.7 - 4.0 K/uL   Monocytes Absolute 0.8 0.1 - 1.0 K/uL   Eosinophils Absolute 0.1 0.0 - 0.7 K/uL   Basophils Absolute 0.0 0.0 - 0.1 K/uL  VITAMIN D 25 Hydroxy (Vit-D Deficiency, Fractures)  Result Value Ref Range   VITD 57.87 30.00 - 100.00 ng/mL    Assessment & Plan:   Problem List Items Addressed This Visit     Health maintenance examination (Chronic)    Preventative protocols reviewed and updated unless pt declined. Discussed healthy diet and lifestyle.       Advanced directives, counseling/discussion (Chronic)    Advanced directive discussion - doesn't have this at home. Husband Jeannett Senior would be HCPOA. Doesn't want prolonged life support if terminal condition. Packet provided previously and again today.       Medicare annual wellness visit, subsequent (Chronic)    I have personally reviewed the Medicare Annual Wellness questionnaire and have noted 1. The patient's medical and social history 2. Their use of alcohol, tobacco or illicit drugs 3. Their current medications and supplements 4. The patient's functional ability including ADL's, fall risks, home safety risks and hearing or visual impairment. Cognitive function has been assessed and addressed as indicated.  5. Diet and physical activity 6. Evidence for depression or mood disorders The patients weight, height, BMI have been recorded in the chart. I have made referrals, counseling and provided education to the patient based on review of the above and I have provided the pt with a written personalized care plan for preventive services. Provider list updated.. See scanned questionairre as needed for further documentation. Reviewed preventative protocols and updated unless pt declined.       Other Visit Diagnoses     Special screening for malignant neoplasms, colon     -  Primary   Relevant Orders   Fecal occult blood, imunochemical        No orders of the defined types were placed in this encounter.   Orders Placed This Encounter  Procedures   Fecal occult blood, imunochemical    Standing Status:   Future    Standing Expiration Date:   04/06/2024    Patient Instructions  Pass by lab to pick up stool kit.  Check with  pharmacy about RSV shot.  Work on advanced directive Schedule appointment with [facility] Dion Body at [facility] Good to see you today Return as needed or in 1 year for next physical  Follow up plan: Return in about 1 year (around 04/06/2024) for annual exam, prior fasting for blood work, medicare wellness visit.  Eustaquio Boyden, MD

## 2023-04-08 ENCOUNTER — Encounter: Payer: Self-pay | Admitting: Family Medicine

## 2023-04-08 DIAGNOSIS — R9230 Dense breasts, unspecified: Secondary | ICD-10-CM | POA: Insufficient documentation

## 2023-04-08 MED ORDER — SPIRIVA RESPIMAT 2.5 MCG/ACT IN AERS: 2.0000 | INHALATION_SPRAY | Freq: Every day | RESPIRATORY_TRACT | 11 refills | Status: DC

## 2023-04-08 MED ORDER — POTASSIUM CHLORIDE CRYS ER 10 MEQ PO TBCR: 10.0000 meq | EXTENDED_RELEASE_TABLET | Freq: Every day | ORAL | 4 refills | Status: DC

## 2023-04-08 MED ORDER — VALSARTAN 80 MG PO TABS: 80.0000 mg | ORAL_TABLET | Freq: Every day | ORAL | 4 refills | Status: DC

## 2023-04-08 NOTE — Assessment & Plan Note (Signed)
Continue aspirin, statin.  

## 2023-04-08 NOTE — Assessment & Plan Note (Signed)
Has continued decreasing, down to 5-6 cig/day. Encouraged full cessation.  Continue yearly lung cancer screening CT.

## 2023-04-08 NOTE — Assessment & Plan Note (Signed)
Chronic, stable on crestor 10mg  daily - continue . The ASCVD Risk score (Arnett DK, et al., 2019) failed to calculate for the following reasons:   The valid HDL cholesterol range is 20 to 100 mg/dL

## 2023-04-08 NOTE — Assessment & Plan Note (Addendum)
Dense breasts by mammo (category D).  13.6% lifetime risk base on Breast Cancer Risk Assessment Tool (BCRAT). Not hight enough to qualify for breast MRI (>20%).  Continue yearly 3D screening mammogram.

## 2023-04-08 NOTE — Assessment & Plan Note (Signed)
Noted on lung cancer screening CT scan.  Consider Lp(a) and/or coronary CTA

## 2023-04-08 NOTE — Assessment & Plan Note (Signed)
Continues Enbrel through rheumatology.

## 2023-04-08 NOTE — Assessment & Plan Note (Signed)
Sees pulm

## 2023-04-08 NOTE — Assessment & Plan Note (Addendum)
No iron overload. Her DNA test is pending

## 2023-04-08 NOTE — Assessment & Plan Note (Signed)
Continues fosamax followed by rheumatology.

## 2023-04-08 NOTE — Assessment & Plan Note (Signed)
Encouraged limiting adddedd sugars/sweetened beverages in diet.

## 2023-04-08 NOTE — Assessment & Plan Note (Signed)
Chronic, stable period on current regimen.  ?

## 2023-04-08 NOTE — Assessment & Plan Note (Signed)
Has cut down  

## 2023-04-10 LAB — HEMOCHROMATOSIS DNA-PCR(C282Y,H63D)

## 2023-04-11 ENCOUNTER — Ambulatory Visit: Payer: PPO | Admitting: Gastroenterology

## 2023-04-11 DIAGNOSIS — M797 Fibromyalgia: Secondary | ICD-10-CM | POA: Diagnosis not present

## 2023-04-11 DIAGNOSIS — M25529 Pain in unspecified elbow: Secondary | ICD-10-CM | POA: Diagnosis not present

## 2023-04-11 DIAGNOSIS — M1990 Unspecified osteoarthritis, unspecified site: Secondary | ICD-10-CM | POA: Diagnosis not present

## 2023-04-11 DIAGNOSIS — Z79899 Other long term (current) drug therapy: Secondary | ICD-10-CM | POA: Diagnosis not present

## 2023-04-11 DIAGNOSIS — M79643 Pain in unspecified hand: Secondary | ICD-10-CM | POA: Diagnosis not present

## 2023-04-11 DIAGNOSIS — M810 Age-related osteoporosis without current pathological fracture: Secondary | ICD-10-CM | POA: Diagnosis not present

## 2023-04-11 DIAGNOSIS — M0579 Rheumatoid arthritis with rheumatoid factor of multiple sites without organ or systems involvement: Secondary | ICD-10-CM | POA: Diagnosis not present

## 2023-04-12 ENCOUNTER — Encounter: Payer: Self-pay | Admitting: Family Medicine

## 2023-04-12 ENCOUNTER — Other Ambulatory Visit: Payer: Self-pay | Admitting: Radiology

## 2023-04-12 DIAGNOSIS — Z1211 Encounter for screening for malignant neoplasm of colon: Secondary | ICD-10-CM

## 2023-04-12 DIAGNOSIS — Z148 Genetic carrier of other disease: Secondary | ICD-10-CM | POA: Insufficient documentation

## 2023-04-14 LAB — FECAL OCCULT BLOOD, IMMUNOCHEMICAL: Fecal Occult Bld: NEGATIVE

## 2023-04-27 ENCOUNTER — Telehealth: Payer: Self-pay | Admitting: *Deleted

## 2023-04-27 NOTE — Telephone Encounter (Signed)
Rn received a secure msg from [employer] requesting a reorder for oxygen therapy for this patient. This is not a patient of the cancer center. We did not order oxygen. Msg sent back. Pt has only seen Boneta Lucks, NP for lung screening in past.

## 2023-05-06 ENCOUNTER — Other Ambulatory Visit: Payer: Self-pay | Admitting: Family Medicine

## 2023-05-06 DIAGNOSIS — M810 Age-related osteoporosis without current pathological fracture: Secondary | ICD-10-CM

## 2023-05-08 NOTE — Progress Notes (Unsigned)
Cardiology Office Note  Date:  05/09/2023   ID:  [name], Kim Braun 05-01-1961, MRN [id number]  PCP:  Eustaquio Boyden, MD   Chief Complaint  Patient presents with   12 month follow up     Patient c/o shortness of breath with over exertion. Medications reviewed by the patient verbally.     HPI:  [name] is a 62 year old woman with past medical history of Smoking, <1/2 pack COPD/emphysema Hypertension Hyperlipidemia Mildly depressed ejection fraction on echo November 2020 Left bundle branch block Memory/cognitive impairment Three-vessel coronary calcification, on CT scan no significant stenosis by catheterization November 2015 Who presents for follow-up of her tachycardia, chest pain  Last seen by myself in clinic February 2023 Seen for preop evaluation September 2023  Active in the garden, denies chest pain concerning for angina No regular exercise program  CT scan with significant coronary calcification, images reviewed  Lab work reviewed A1C 5.9 Total cholesterol 190s LDL 80s  EKG personally reviewed by myself on todays visit Normal sinus rhythm rate 95 bpm left bundle branch block  Left heart catheterization November 2015, No significant coronary disease, mild proximal calcification of the LAD, normal LV function  Er 11/25/21: chest pain, on left Hurt to take a breath,coughing CT scanning images reviewed  No evidence of pulmonary embolism. 2. Small left hydropneumothorax. 3. Consolidation in the left base is suspicious for pneumonia. 4. Background of centrilobular and paraseptal emphysema with large left upper lobe bullae. Aortic Atherosclerosis 3 vessel CAD CT images pulled up, heavy three-vessel coronary calcification noted    PMH:   has a past medical history of C. difficile diarrhea (10/29/2022), CAP (community acquired pneumonia) (12/20/2021), COPD (chronic obstructive pulmonary disease) (HCC) (01/2019), COVID-19 virus infection (07/23/2021),  History of cardiac murmur as a child, History of gestational diabetes, Hypertension, Left bundle branch block, Rheumatoid arthritis(714.0), Smoker, and Thyroid nodule.  PSH:    Past Surgical History:  Procedure Laterality Date   ABDOMINAL HYSTERECTOMY  12/19/2012   Procedure: HYSTERECTOMY ABDOMINAL;  Surgeon: Dorien Chihuahua. Richardson Dopp, MD;  Location: [facility];  Service: Gynecology;  Laterality: N/A;  Incision @ 1517   ANTERIOR CERVICAL DECOMP/DISCECTOMY FUSION N/A 07/17/2013   Cervical four-five, Cervical five-six Anterior cervical decompression/diskectomy/fusion;  Surgeon: Karn Cassis, MD;  Location: [facility];  Service: Neurosurgery;  Laterality: N/A;  Cervical four-five, Cervical five-six Anterior cervical decompression/diskectomy/fusion   BILATERAL SALPINGECTOMY  12/19/2012   Procedure: BILATERAL SALPINGECTOMY;  Surgeon: Dorien Chihuahua. Richardson Dopp, MD;  Location: [facility];  Service: Gynecology;  Laterality: Bilateral;   CARDIAC CATHETERIZATION  09/30/2014   Procedure: LEFT HEART CATH AND CORONARY ANGIOGRAPHY;  Surgeon: Pamella Pert, MD;  Location: [facility] CATH LAB;  Service: Cardiovascular;;   CESAREAN SECTION     COLON SURGERY  1992   CYSTOSCOPY  12/19/2012   Procedure: CYSTOSCOPY;  Surgeon: Dorien Chihuahua. Richardson Dopp, MD;  Location: [facility];  Service: Gynecology;  Laterality: N/A;   ROBOTIC ASSISTED TOTAL HYSTERECTOMY  12/19/2012   total hysterectomy for fibroids, ovaries removed Kim Braun)    Current Outpatient Medications  Medication Sig Dispense Refill   albuterol (PROVENTIL HFA;VENTOLIN HFA) 108 (90 BASE) MCG/ACT inhaler Inhale 2 puffs into the lungs daily as needed for wheezing or shortness of breath.     alendronate (FOSAMAX) 70 MG tablet TAKE 1 TABLET BY MOUTH ONCE WEEKLY 30 MINS PRIOR TO THE FIRST FOOD/BEVERAGE OF THE DAY 28 12 tablet 4   aspirin EC 81 MG tablet Take 1 tablet (81 mg total) by  mouth daily. Swallow whole. 90 tablet 3   betamethasone dipropionate 0.05 % cream APPLY TOPICALLY 2 (TWO) TIMES DAILY AS  NEEDED 30 g 0   Calcium Carbonate-Vit D-Min (CALTRATE PLUS PO) Take by mouth daily.     Etanercept (ENBREL MINI) 50 MG/ML SOCT Inject into the skin.     Multiple Vitamins-Minerals (MULTIVITAMIN ADULTS 50+) TABS Take 1 tablet by mouth daily.     nicotine (NICODERM CQ) 21 mg/24hr patch Place 1 patch (21 mg total) onto the skin daily. 28 patch 0   potassium chloride (KLOR-CON M) 10 MEQ tablet Take 1 tablet (10 mEq total) by mouth daily. 90 tablet 4   rosuvastatin (CRESTOR) 10 MG tablet TAKE 1 TABLET BY MOUTH EVERY DAY 90 tablet 0   Tiotropium Bromide Monohydrate (SPIRIVA RESPIMAT) 2.5 MCG/ACT AERS Inhale 2 puffs into the lungs daily. 4 g 11   valsartan (DIOVAN) 80 MG tablet Take 1 tablet (80 mg total) by mouth daily. 90 tablet 4   No current facility-administered medications for this visit.     Allergies:   Patient has no known allergies.   Social History:  The patient  reports that she has been smoking cigarettes. She has a 22.00 pack-year smoking history. She has never used smokeless tobacco. She reports current alcohol use of about 14.0 - 24.0 standard drinks of alcohol per week. She reports that she does not use drugs.   Family History:   family history includes Breast cancer in her sister; CAD in her brother and mother; Diabetes in her sister; Hypertension in her brother, brother, and sister; Stroke in her mother.    Review of Systems: Review of Systems  Constitutional: Negative.   HENT: Negative.    Respiratory: Negative.    Cardiovascular: Negative.   Gastrointestinal: Negative.   Musculoskeletal: Negative.   Neurological: Negative.   Psychiatric/Behavioral: Negative.    All other systems reviewed and are negative.    PHYSICAL EXAM: VS:  BP 120/80 (BP Location: Left Arm, Patient Position: Sitting, Cuff Size: Normal)   Pulse 89   Ht 5\' 2"  (1.575 m)   Wt 106 lb 2 oz (48.1 kg)   LMP 12/03/2012   SpO2 97%   BMI 19.41 kg/m  , BMI Body mass index is 19.41  kg/m. Constitutional:  oriented to person, place, and time. No distress.  HENT:  Head: Grossly normal Eyes:  no discharge. No scleral icterus.  Neck: No JVD, no carotid bruits  Cardiovascular: Regular rate and rhythm, no murmurs appreciated Pulmonary/Chest: Clear to auscultation bilaterally, no wheezes or rails Abdominal: Soft.  no distension.  no tenderness.  Musculoskeletal: Normal range of motion Neurological:  normal muscle tone. Coordination normal. No atrophy Skin: Skin warm and dry Psychiatric: normal affect, pleasant  Recent Labs: 03/31/2023: ALT 27; BUN 8; Creatinine, Ser 0.63; Hemoglobin 13.2; Platelets 236.0; Potassium 4.1; Sodium 136; TSH 0.87    Lipid Panel Lab Results  Component Value Date   CHOL 196 03/31/2023   HDL 104.70 03/31/2023   LDLCALC 80 03/31/2023   TRIG 58.0 03/31/2023      Wt Readings from Last 3 Encounters:  05/09/23 106 lb 2 oz (48.1 kg)  04/07/23 106 lb 2 oz (48.1 kg)  09/23/22 107 lb 2 oz (48.6 kg)      ASSESSMENT AND PLAN:  Problem List Items Addressed This Visit       Cardiology Problems   Hypertension   Relevant Orders   EKG 12-Lead   Atherosclerosis of aorta (HCC) - Primary  Relevant Orders   EKG 12-Lead   Hyperlipidemia   CAD (coronary artery disease)   Relevant Orders   EKG 12-Lead   Left bundle branch block   Relevant Orders   EKG 12-Lead  Coronary disease with stable angina Heavy three-vessel coronary calcification on CT scan, images reviewed Crestor up to 20 daily with aspirin 81 daily Discussed anginal symptoms to watch for  Goal LDL less than 55  Tachycardia Stable rate, not on b-blocker  COPD/emphysema/smoking We have encouraged her to continue to work on weaning her cigarettes and smoking cessation. She will continue to work on this and does not want any assistance with chantix.    Hyperlipidemia High cholesterol, above goal Does not want zetia , "another pill" Will increase crestor to 20 mg daily Goal  LDL <55   Total encounter time more than 30 minutes  Greater than 50% was spent in counseling and coordination of care with the patient    Signed, Dossie Arbour, M.D., Ph.D. [facility] [place], [place] [phone]

## 2023-05-09 ENCOUNTER — Ambulatory Visit: Payer: PPO | Attending: Cardiovascular Disease | Admitting: Cardiovascular Disease

## 2023-05-09 ENCOUNTER — Encounter: Payer: Self-pay | Admitting: Cardiovascular Disease

## 2023-05-09 VITALS — BP 120/80 | HR 89 | Ht 62.0 in | Wt 106.1 lb

## 2023-05-09 DIAGNOSIS — I447 Left bundle-branch block, unspecified: Secondary | ICD-10-CM | POA: Diagnosis not present

## 2023-05-09 DIAGNOSIS — I700 Atherosclerosis of aorta: Secondary | ICD-10-CM | POA: Diagnosis not present

## 2023-05-09 DIAGNOSIS — I10 Essential (primary) hypertension: Secondary | ICD-10-CM

## 2023-05-09 DIAGNOSIS — E7800 Pure hypercholesterolemia, unspecified: Secondary | ICD-10-CM

## 2023-05-09 DIAGNOSIS — I25118 Atherosclerotic heart disease of native coronary artery with other forms of angina pectoris: Secondary | ICD-10-CM | POA: Diagnosis not present

## 2023-05-09 MED ORDER — ROSUVASTATIN CALCIUM 20 MG PO TABS: 20.0000 mg | ORAL_TABLET | Freq: Every day | ORAL | 3 refills | Status: DC

## 2023-05-09 NOTE — Patient Instructions (Addendum)
Medication Instructions:  Please increase the crestor uip to 20 mg daily  If you need a refill on your cardiac medications before your next appointment, please call your pharmacy.   Lab work: No new labs needed  Testing/Procedures: No new testing needed  Follow-Up: At [facility], you and your health needs are our priority.  As part of our continuing mission to provide you with exceptional heart care, we have created designated Provider Care Teams.  These Care Teams include your primary Cardiologist (physician) and Advanced Practice Providers (APPs -  Physician Assistants and Nurse Practitioners) who all work together to provide you with the care you need, when you need it.  You will need a follow up appointment in 12 months  Providers on your designated Care Team:   Nicolasa Ducking, NP Eula Listen, PA-C Cadence Fransico [place], [place]  COVID-19 Vaccine Information can be found at: [email or web address] For questions related to vaccine distribution or appointments, please email [email or web address] or call 3186828254.

## 2023-05-12 ENCOUNTER — Telehealth: Payer: Self-pay | Admitting: Family Medicine

## 2023-05-12 NOTE — Telephone Encounter (Signed)
On May 3, the lab needed a pre-authorization, it was denied by the insurance company for no prior authorization   30-40 days for authorization to process

## 2023-05-12 NOTE — Telephone Encounter (Signed)
Lvm asking pt to call back.  Need to relay Dr. G's message.  

## 2023-05-12 NOTE — Telephone Encounter (Signed)
Spoke with pt relaying Dr. G's message. Pt verbalizes understanding and will let us know what she finds out.  °

## 2023-05-12 NOTE — Telephone Encounter (Signed)
Patient called in stating that she had hemochromatosis testing done and her insurance isn't covering it because there was no pre authorization for it,now she has a 500 dollar bill that she has to pay. She would like to know how she should go about getting this taken care of?

## 2023-05-12 NOTE — Telephone Encounter (Signed)
Hemochromatosis genetic screening should be covered for first degree relatives of patients with hemochromatosis.  She should call insurance to see what the process is to get this covered, to let us know if we need to do anything different on our end.

## 2023-06-21 ENCOUNTER — Other Ambulatory Visit: Payer: Self-pay | Admitting: Physician Assistant

## 2023-07-13 ENCOUNTER — Encounter: Payer: Self-pay | Admitting: Internal Medicine

## 2023-07-13 ENCOUNTER — Ambulatory Visit: Payer: PPO | Admitting: Internal Medicine

## 2023-07-13 VITALS — BP 118/74 | HR 111 | Temp 97.6°F | Ht 63.0 in | Wt 106.0 lb

## 2023-07-13 DIAGNOSIS — J449 Chronic obstructive pulmonary disease, unspecified: Secondary | ICD-10-CM

## 2023-07-13 DIAGNOSIS — J9611 Chronic respiratory failure with hypoxia: Secondary | ICD-10-CM | POA: Diagnosis not present

## 2023-07-13 MED ORDER — SPIRIVA RESPIMAT 2.5 MCG/ACT IN AERS
2.0000 | INHALATION_SPRAY | Freq: Every day | RESPIRATORY_TRACT | 11 refills | Status: AC
Start: 2023-07-13 — End: ?

## 2023-07-13 NOTE — Progress Notes (Signed)
Name: Kim Braun MRN: [id number] DOB: [birth year]     CONSULTATION DATE: 07/13/2023  REFERRING MD : Dr. Maryla Morrow  SYNOPSIS 62 year old pleasant white female seen today to establish care for assessment for COPD Patient is an active smoker of 1/2 pack/day for the last 40 to 50 years  She has been diagnosed with COPD 2 years ago Patient supposedly had a diagnosis of pneumonia at the same time   Patient has chronic shortness of breath chronic dyspnea on exertion Patient has a productive cough for many years which is consistent with chronic bronchitis PFTs 2023 MILD/moderate obstructive lung disease  CHIEF COMPLAINT: Follow-up assessment of COPD  HPI  No exacerbation at this time No evidence of heart failure at this time No evidence or signs of infection at this time No respiratory distress No fevers, chills, nausea, vomiting, diarrhea No evidence of lower extremity edema No evidence hemoptysis  Current inhaler regimen includes Continue Spiriva Respimat 2.5   Smoking Assessment and Cessation Counseling Upon further questioning, Patient smokes 1/2 ppd I have advised patient to quit/stop smoking as soon as possible due to high risk for multiple medical problems  Patient is  NOT willing to quit smoking  I have advised patient that we can assist and have options of Nicotine replacement therapy. I also advised patient on behavioral therapy and can provide oral medication therapy in conjunction with the other therapies Follow up next Office visit  for assessment of smoking cessation Smoking cessation counseling advised for 4 minutes   CT scan independently reviewed by me today Low-dose CT chest 2023 Extensive bullous emphysema No lung nodules or masses noted  Low-dose CT chest March 2024 Extensive bullous emphysema No lung nodules or masses noted   PAST MEDICAL HISTORY :   has a past medical history of C. difficile diarrhea (10/29/2022), CAP (community acquired  pneumonia) (12/20/2021), COPD (chronic obstructive pulmonary disease) (HCC) (01/2019), COVID-19 virus infection (07/23/2021), History of cardiac murmur as a child, History of gestational diabetes, Hypertension, Left bundle branch block, Rheumatoid arthritis(714.0), Smoker, and Thyroid nodule.  has a past surgical history that includes Cesarean section; Colon surgery (1992); Robotic assisted total hysterectomy (12/19/2012); Bilateral salpingectomy (12/19/2012); Abdominal hysterectomy (12/19/2012); Cystoscopy (12/19/2012); Anterior cervical decomp/discectomy fusion (N/A, 07/17/2013); and Cardiac catheterization (09/30/2014). Prior to Admission medications   Medication Sig Start Date End Date Taking? Authorizing Provider  Adalimumab (HUMIRA PEN) 40 MG/0.4ML PNKT Inject 40mg  subcutaneously every *OTHER week as directed 02/05/20  Yes [provider]  albuterol (PROVENTIL HFA;VENTOLIN HFA) 108 (90 BASE) MCG/ACT inhaler Inhale 2 puffs into the lungs daily as needed for wheezing or shortness of breath.   Yes [provider]  alendronate (FOSAMAX) 70 MG tablet Take 70 mg by mouth once a week. 11/12/20  Yes [provider]  betamethasone dipropionate 0.05 % cream Apply topically 2 (two) times daily.   Yes [provider]  Biotin 10 MG TABS 1 tablet   Yes [provider]  CALCIUM-VITAMIN D PO Take by mouth daily. Calcium 600 mg + vit D3   Yes [provider]  Cholecalciferol (VITAMIN D3 PO) Take 2,000 Units by mouth.   Yes [provider]  KLOR-CON M20 20 MEQ tablet Take 20 mEq by mouth daily. 09/14/20  Yes [provider]  L-Lysine 500 MG TABS Take by mouth daily.   Yes [provider]  losartan (COZAAR) 100 MG tablet Take 100 mg by mouth daily. 11/10/17  Yes [provider]  SPIRIVA HANDIHALER 18 MCG inhalation  capsule 1 capsule daily. 08/24/20  Yes [provider]   No Known Allergies  FAMILY HISTORY:  family  history includes Breast cancer in her sister; CAD in her brother and mother; Diabetes in her sister; Hypertension in her brother, brother, and sister; Stroke in her mother. SOCIAL HISTORY:  reports that she has been smoking cigarettes. She has a 22 pack-year smoking history. She has never used smokeless tobacco. She reports current alcohol use of about 14.0 - 24.0 standard drinks of alcohol per week. She reports that she does not use drugs.   BP 118/74 (BP Location: Left Arm, Cuff Size: Normal)   Pulse (!) 111   Temp 97.6 F (36.4 C) (Temporal)   Ht 5\' 3"  (1.6 m)   Wt 106 lb (48.1 kg)   LMP 12/03/2012   SpO2 100%   BMI 18.78 kg/m       Review of Systems: Gen:  Denies  fever, sweats, chills weight loss  HEENT: Denies blurred vision, double vision, ear pain, eye pain, hearing loss, nose bleeds, sore throat Cardiac:  No dizziness, chest pain or heaviness, chest tightness,edema, No JVD Resp:   No cough, -sputum production, -shortness of breath,-wheezing, -hemoptysis,  Other:  All other systems negative   Physical Examination:   General Appearance: No distress  EYES PERRLA, EOM intact.   NECK Supple, No JVD Pulmonary: normal breath sounds, No wheezing.  CardiovascularNormal S1,S2.  No m/r/g.   Abdomen: Benign, Soft, non-tender. Neurology UE/LE 5/5 strength, no focal deficits Ext pulses intact, cap refill intact ALL OTHER ROS ARE NEGATIVE  ASSESSMENT AND PLAN SYNOPSIS 62 year old pleasant white female seen today for follow-up ongoing tobacco abuse with a history of diagnosis of COPD with previous admissions with pneumonia and COPD exacerbation with abnormal CT chest that shows bullous emphysema on the left side with a previous history of left-sided pneumonia left-sided hydropneumothorax In the setting of chronic hypoxic respiratory failure  Chronic Hypoxic resp failure due to COPD -Patient benefits from oxygen therapy 2L   at night Will check overnight pulse oximetry to  reevaluate oxygen needs  COPD mild to moderate Continue Spiriva Respimat at this time Albuterol as needed No indication for antibiotics or prednisone at this time Avoid sick contacts   Recommend smoking cessation   Follow up Lung cancer screening protocol CT scans reviewed in detail with patient No masses or nodules seen   MEDICATION ADJUSTMENTS/LABS AND TESTS ORDERED: Please stop smoking Continue Spiriva as prescribed Avoid secondhand smoke Avoid SICK contacts Recommend  Masking  when appropriate Recommend Keep up-to-date with vaccinations Will recheck oxygen levels at nighttime with pulse oximetry CURRENT MEDICATIONS REVIEWED AT LENGTH WITH PATIENT TODAY   Patient satisfied with Plan of action and management. All questions answered  Follow-up in 1 year  Total time spent 22 mins   Santiago Glad, M.D.  [facility]  Medical Director [facility] Medical Director [facility] Cardio-Pulmonary Department

## 2023-07-13 NOTE — Patient Instructions (Addendum)
Please stop smoking  Continue inhalers as prescribed  Avoid secondhand smoke Avoid SICK contacts Recommend  Masking  when appropriate Recommend Keep up-to-date with vaccinations   Will recheck oxygen levels at nighttime with pulse oximetry

## 2023-08-07 DIAGNOSIS — J449 Chronic obstructive pulmonary disease, unspecified: Secondary | ICD-10-CM | POA: Diagnosis not present

## 2023-08-09 ENCOUNTER — Telehealth: Payer: Self-pay

## 2023-08-09 DIAGNOSIS — J449 Chronic obstructive pulmonary disease, unspecified: Secondary | ICD-10-CM | POA: Diagnosis not present

## 2023-08-09 NOTE — Telephone Encounter (Signed)
Spoke to patient and relayed below results/recommendations. She stated that she had oxygen at home and she will resume oxygen.  Nothing further needed.

## 2023-08-09 NOTE — Telephone Encounter (Signed)
ONO reviewed by Dr. Belia Heman- recommend 1L at bedtime  Lowest spo2 85%  Lm for patient.

## 2023-08-09 NOTE — Telephone Encounter (Signed)
Please try again. I explained notes to PT but clinical interpretation. [phone]. Thanks

## 2023-09-08 DIAGNOSIS — J449 Chronic obstructive pulmonary disease, unspecified: Secondary | ICD-10-CM | POA: Diagnosis not present

## 2023-10-09 DIAGNOSIS — J449 Chronic obstructive pulmonary disease, unspecified: Secondary | ICD-10-CM | POA: Diagnosis not present

## 2023-10-12 DIAGNOSIS — M797 Fibromyalgia: Secondary | ICD-10-CM | POA: Diagnosis not present

## 2023-10-12 DIAGNOSIS — M0579 Rheumatoid arthritis with rheumatoid factor of multiple sites without organ or systems involvement: Secondary | ICD-10-CM | POA: Diagnosis not present

## 2023-10-12 DIAGNOSIS — M2550 Pain in unspecified joint: Secondary | ICD-10-CM | POA: Diagnosis not present

## 2023-10-12 DIAGNOSIS — M1990 Unspecified osteoarthritis, unspecified site: Secondary | ICD-10-CM | POA: Diagnosis not present

## 2023-10-12 DIAGNOSIS — Z79899 Other long term (current) drug therapy: Secondary | ICD-10-CM | POA: Diagnosis not present

## 2023-10-12 DIAGNOSIS — M79643 Pain in unspecified hand: Secondary | ICD-10-CM | POA: Diagnosis not present

## 2023-10-12 DIAGNOSIS — M810 Age-related osteoporosis without current pathological fracture: Secondary | ICD-10-CM | POA: Diagnosis not present

## 2023-11-08 DIAGNOSIS — J449 Chronic obstructive pulmonary disease, unspecified: Secondary | ICD-10-CM | POA: Diagnosis not present

## 2023-11-30 ENCOUNTER — Other Ambulatory Visit: Payer: Self-pay | Admitting: Family Medicine

## 2023-11-30 DIAGNOSIS — I10 Essential (primary) hypertension: Secondary | ICD-10-CM

## 2023-12-09 DIAGNOSIS — J449 Chronic obstructive pulmonary disease, unspecified: Secondary | ICD-10-CM | POA: Diagnosis not present

## 2024-01-09 DIAGNOSIS — J449 Chronic obstructive pulmonary disease, unspecified: Secondary | ICD-10-CM | POA: Diagnosis not present

## 2024-01-15 ENCOUNTER — Ambulatory Visit: Payer: Self-pay | Admitting: Family Medicine

## 2024-01-15 ENCOUNTER — Emergency Department (HOSPITAL_COMMUNITY)
Admission: EM | Admit: 2024-01-15 | Discharge: 2024-01-15 | Disposition: A | Discharge status: Home / Self Care | Payer: PPO | Attending: Emergency Medicine | Admitting: Emergency Medicine

## 2024-01-15 ENCOUNTER — Emergency Department (HOSPITAL_COMMUNITY): Payer: PPO

## 2024-01-15 ENCOUNTER — Other Ambulatory Visit: Payer: Self-pay

## 2024-01-15 DIAGNOSIS — Y92002 Bathroom of unspecified non-institutional (private) residence single-family (private) house as the place of occurrence of the external cause: Secondary | ICD-10-CM | POA: Insufficient documentation

## 2024-01-15 DIAGNOSIS — Z7982 Long term (current) use of aspirin: Secondary | ICD-10-CM | POA: Insufficient documentation

## 2024-01-15 DIAGNOSIS — R42 Dizziness and giddiness: Secondary | ICD-10-CM | POA: Insufficient documentation

## 2024-01-15 DIAGNOSIS — M25551 Pain in right hip: Secondary | ICD-10-CM | POA: Diagnosis not present

## 2024-01-15 DIAGNOSIS — M25552 Pain in left hip: Secondary | ICD-10-CM | POA: Insufficient documentation

## 2024-01-15 DIAGNOSIS — S3210XA Unspecified fracture of sacrum, initial encounter for closed fracture: Secondary | ICD-10-CM | POA: Insufficient documentation

## 2024-01-15 DIAGNOSIS — I6782 Cerebral ischemia: Secondary | ICD-10-CM | POA: Diagnosis not present

## 2024-01-15 DIAGNOSIS — M533 Sacrococcygeal disorders, not elsewhere classified: Secondary | ICD-10-CM | POA: Diagnosis not present

## 2024-01-15 DIAGNOSIS — S300XXA Contusion of lower back and pelvis, initial encounter: Secondary | ICD-10-CM | POA: Diagnosis not present

## 2024-01-15 DIAGNOSIS — Y92009 Unspecified place in unspecified non-institutional (private) residence as the place of occurrence of the external cause: Secondary | ICD-10-CM

## 2024-01-15 DIAGNOSIS — W1830XA Fall on same level, unspecified, initial encounter: Secondary | ICD-10-CM | POA: Insufficient documentation

## 2024-01-15 DIAGNOSIS — M545 Low back pain, unspecified: Secondary | ICD-10-CM | POA: Diagnosis present

## 2024-01-15 DIAGNOSIS — J449 Chronic obstructive pulmonary disease, unspecified: Secondary | ICD-10-CM | POA: Diagnosis not present

## 2024-01-15 LAB — CBC
HCT: 38.3 % (ref 36.0–46.0)
Hemoglobin: 12.6 g/dL (ref 12.0–15.0)
MCH: 31.3 pg (ref 26.0–34.0)
MCHC: 32.9 g/dL (ref 30.0–36.0)
MCV: 95.3 fL (ref 80.0–100.0)
Platelets: 202 10*3/uL (ref 150–400)
RBC: 4.02 MIL/uL (ref 3.87–5.11)
RDW: 14.3 % (ref 11.5–15.5)
WBC: 6.6 10*3/uL (ref 4.0–10.5)
nRBC: 0 % (ref 0.0–0.2)

## 2024-01-15 LAB — BASIC METABOLIC PANEL
Anion gap: 11 (ref 5–15)
BUN: 6 mg/dL — ABNORMAL LOW (ref 8–23)
CO2: 21 mmol/L — ABNORMAL LOW (ref 22–32)
Calcium: 9 mg/dL (ref 8.9–10.3)
Chloride: 105 mmol/L (ref 98–111)
Creatinine, Ser: 0.59 mg/dL (ref 0.44–1.00)
GFR, Estimated: >60 mL/min (ref >60)
Glucose, Bld: 104 mg/dL — ABNORMAL HIGH (ref 70–99)
Potassium: 4.3 mmol/L (ref 3.5–5.1)
Sodium: 137 mmol/L (ref 135–145)

## 2024-01-15 LAB — URINALYSIS, ROUTINE W REFLEX MICROSCOPIC
Bilirubin Urine: NEGATIVE
Glucose, UA: NEGATIVE mg/dL
Hgb urine dipstick: NEGATIVE
Ketones, ur: NEGATIVE mg/dL
Leukocytes,Ua: NEGATIVE
Nitrite: NEGATIVE
Protein, ur: NEGATIVE mg/dL
Specific Gravity, Urine: 1.008 (ref 1.005–1.030)
pH: 5 (ref 5.0–8.0)

## 2024-01-15 MED ORDER — HYDROCODONE-ACETAMINOPHEN 5-325 MG PO TABS: 2.0000 | ORAL_TABLET | ORAL | 0 refills | Status: DC | PRN

## 2024-01-15 MED ORDER — MECLIZINE HCL 12.5 MG PO TABS: 12.5000 mg | ORAL_TABLET | Freq: Three times a day (TID) | ORAL | 0 refills | Status: DC | PRN

## 2024-01-15 MED ORDER — HYDROCODONE-ACETAMINOPHEN 5-325 MG PO TABS
1.0000 | ORAL_TABLET | Freq: Once | ORAL | Status: AC
  Administered 2024-01-15: 1 via ORAL
  Filled 2024-01-15: qty 1

## 2024-01-15 MED ORDER — MECLIZINE HCL 25 MG PO TABS
12.5000 mg | ORAL_TABLET | Freq: Once | ORAL | Status: AC
  Administered 2024-01-15: 12.5 mg via ORAL
  Filled 2024-01-15: qty 1

## 2024-01-15 NOTE — Telephone Encounter (Signed)
Noted. Will await ER eval.  

## 2024-01-15 NOTE — ED Notes (Signed)
Pt ambulated to restroom without difficulty but stated that she felt slightly dizzy while on her feet. RN provided standby assistance for safety.

## 2024-01-15 NOTE — Telephone Encounter (Signed)
Discharged with recommended SM f/u

## 2024-01-15 NOTE — Telephone Encounter (Signed)
I spoke with pt; pt said she fell backwards and hit back of head and tailbone on 01/11/24; pt said she did lose consciousness and not sure how long pt was out. Pt said now H/A pain level is 4 and tailbone pain is at a 6 after taking oxycodone..prior to pain pain med pain level for tailbone was 10. Pt cannot sit down and cannot put on socks. Pt said at times pain is so bad pt sees stars. Pt has wobbly balance and dizziness. With these symptoms pt will go to [facility] ED now for eval and any needed testing. Sending note to Dr Reece Agar and G pool.

## 2024-01-15 NOTE — ED Notes (Signed)
 Patient transported to MRI

## 2024-01-15 NOTE — ED Provider Notes (Signed)
[place] EMERGENCY DEPARTMENT AT [facility] Provider Note   CSN: [id number] Arrival date & time: 01/15/24  4098     History {Add pertinent medical, surgical, social history, OB history to HPI:1} Chief Complaint  Patient presents with   Fall   Dizziness    Kim Braun is a 63 y.o. female.  63 year old female with a history of COPD and left bundle branch block who presents emergency department after syncopal event.  Patient reports that last Thursday she was in the bathroom and started feeling as though she was going to pass out.  Put her hand on the sink but says that she fully lost consciousness.  Think she may have hit her head on the wall on the way down but is unsure.  Since then says that she has felt wobbly when walking around.  Also having bilateral hip pain left worse than right.  Has been trying NSAIDs and Percocet with some improvement of her pain.  Says that she called her outpatient doctor who told her to come to the emergency department for evaluation.  Also says she has been having a ringing out of her right ear recently.  Denies palpitations, chest pain, shortness of breath, double vision, difficulty speaking or swallowing.       Home Medications Prior to Admission medications   Medication Sig Start Date End Date Taking? Authorizing Provider  albuterol (PROVENTIL HFA;VENTOLIN HFA) 108 (90 BASE) MCG/ACT inhaler Inhale 2 puffs into the lungs daily as needed for wheezing or shortness of breath.    [provider]  alendronate (FOSAMAX) 70 MG tablet TAKE 1 TABLET BY MOUTH ONCE WEEKLY 30 MINS PRIOR TO THE FIRST FOOD/BEVERAGE OF THE DAY 28 05/08/23   Eustaquio Boyden, MD  aspirin EC 81 MG tablet Take 1 tablet (81 mg total) by mouth daily. Swallow whole. 01/04/22   Antonieta Iba, MD  betamethasone dipropionate 0.05 % cream APPLY TOPICALLY 2 (TWO) TIMES DAILY AS NEEDED 12/04/22   Eustaquio Boyden, MD  Calcium Carbonate-Vit D-Min (CALTRATE PLUS PO) Take  by mouth daily.    [provider]  Etanercept (ENBREL MINI) 50 MG/ML SOCT Inject into the skin.    [provider]  Multiple Vitamins-Minerals (MULTIVITAMIN ADULTS 50+) TABS Take 1 tablet by mouth daily.    [provider]  nicotine (NICODERM CQ) 21 mg/24hr patch Place 1 patch (21 mg total) onto the skin daily. 09/23/22   Eustaquio Boyden, MD  potassium chloride (KLOR-CON M) 10 MEQ tablet Take 1 tablet (10 mEq total) by mouth daily. 04/08/23   Eustaquio Boyden, MD  rosuvastatin (CRESTOR) 20 MG tablet Take 1 tablet (20 mg total) by mouth daily. 05/09/23   Antonieta Iba, MD  Tiotropium Bromide Monohydrate (SPIRIVA RESPIMAT) 2.5 MCG/ACT AERS Inhale 2 puffs into the lungs daily. 07/13/23   Erin Fulling, MD  valsartan (DIOVAN) 80 MG tablet TAKE 1 TABLET BY MOUTH EVERY DAY 11/30/23   Eustaquio Boyden, MD      Allergies    Patient has no known allergies.    Review of Systems   Review of Systems  Physical Exam Updated Vital Signs BP (!) 157/94   Pulse (!) 102   Temp 97.6 F (36.4 C)   Resp 20   LMP 12/03/2012   SpO2 100%  Physical Exam Vitals and nursing note reviewed.  Constitutional:      General: She is not in acute distress.    Appearance: She is well-developed.  HENT:  Head: Normocephalic and atraumatic.     Right Ear: Tympanic membrane, ear canal and external ear normal.     Left Ear: Tympanic membrane, ear canal and external ear normal.     Nose: Nose normal.  Eyes:     Extraocular Movements: Extraocular movements intact.     Conjunctiva/sclera: Conjunctivae normal.     Pupils: Pupils are equal, round, and reactive to light.  Cardiovascular:     Rate and Rhythm: Normal rate and regular rhythm.     Heart sounds: No murmur heard. Pulmonary:     Effort: Pulmonary effort is normal. No respiratory distress.     Breath sounds: Normal breath sounds.  Musculoskeletal:     Cervical back: Normal range of motion and neck supple.     Right lower leg:  No edema.     Left lower leg: No edema.  Skin:    General: Skin is warm and dry.  Neurological:     Mental Status: She is alert.     Comments: MENTAL STATUS: AAOx3 CRANIAL NERVES: II: Pupils equal and reactive 4 mm BL, no RAPD, no VF deficits III, IV, VI: EOM intact, no gaze preference or deviation, no nystagmus. V: normal sensation to light touch in V1, V2, and V3 segments bilaterally VII: no facial weakness or asymmetry, no nasolabial fold flattening VIII: normal hearing to speech and finger friction IX, X: normal palatal elevation, no uvular deviation XI: 5/5 head turn and 5/5 shoulder shrug bilaterally XII: midline tongue protrusion MOTOR: 5/5 strength in R shoulder flexion, elbow flexion and extension, and grip strength. 5/5 strength in L shoulder flexion, elbow flexion and extension, and grip strength.  5/5 strength in R hip and knee flexion, knee extension, ankle plantar and dorsiflexion. 5/5 strength in L hip and knee flexion, knee extension, ankle plantar and dorsiflexion. SENSORY: Normal sensation to light touch in all extremities COORD: Normal finger to nose and heel to shin, no tremor, no dysmetria STATION: normal stance, no truncal ataxia GAIT: Does have some difficulty with ambulation and required assistance and to hold onto things at several points   Psychiatric:        Mood and Affect: Mood normal.     ED Results / Procedures / Treatments   Labs (all labs ordered are listed, but only abnormal results are displayed) Labs Reviewed  BASIC METABOLIC PANEL  CBC  URINALYSIS, ROUTINE W REFLEX MICROSCOPIC    EKG EKG Interpretation Date/Time:  Monday January 15 2024 10:10:56 EST Ventricular Rate:  80 PR Interval:  164 QRS Duration:  110 QT Interval:  428 QTC Calculation: 493 R Axis:   106  Text Interpretation: Sinus rhythm Incomplete left bundle branch block Abnormal ekg Confirmed by Vonita Moss [phone]) on 01/15/2024 11:08:28 AM  Radiology No results  found.  Procedures Procedures  {Document cardiac monitor, telemetry assessment procedure when appropriate:1}  Medications Ordered in ED Medications - No data to display  ED Course/ Medical Decision Making/ A&P   {   Click here for ABCD2, HEART and other calculatorsREFRESH Note before signing :1}                              Medical Decision Making Amount and/or Complexity of Data Reviewed Labs: ordered. Radiology: ordered.  Risk Prescription drug management.   ***  {Document critical care time when appropriate:1} {Document review of labs and clinical decision tools ie heart score, Chads2Vasc2 etc:1}  {Document your independent review  of radiology images, and any outside records:1} {Document your discussion with family members, caretakers, and with consultants:1} {Document social determinants of health affecting pt's care:1} {Document your decision making why or why not admission, treatments were needed:1} Final Clinical Impression(s) / ED Diagnoses Final diagnoses:  None    Rx / DC Orders ED Discharge Orders     None

## 2024-01-15 NOTE — Telephone Encounter (Signed)
  Chief Complaint: fell Thursday hit head and tailbone severe pain difficulty walking Symptoms: fell hitting back / side of head. Now seeing stars, balance off difficulty walking  Frequency: Thursday  Pertinent Negatives: Patient denies difficulty breathing  Disposition: [x] ED /[] Urgent Care (no appt availability in office) / [] Appointment(In office/virtual)/ []  [place] [facility]/ [] Home Care/ [] Refused Recommended Disposition /[] [place] [facility]/ []  Follow-up with PCP Additional Notes:   Recommended ED now and to call 911 for assistance if she is unable to walk to car. Patient declined and said she can't afford all that. Called CAL to notify patient would rather be seen by PCP. Transferred call to nurse at practice.       Copied from CRM [id number]. Topic: Clinical - Red Word Triage >> Jan 15, 2024  8:36 AM Deaijah H wrote: Red Word that prompted transfer to Nurse Triage: Larey Seat and hit head & tailbone. Issues sitting. Reason for Disposition  Injury (or injuries) that need emergency care  Answer Assessment - Initial Assessment Questions 1. MECHANISM: "How did the fall happen?"     In bathroom got lightheaded balance has been off 2. DOMESTIC VIOLENCE AND ELDER ABUSE SCREENING: "Did you fall because someone pushed you or tried to hurt you?" If Yes, ask: "Are you safe now?"     na 3. ONSET: "When did the fall happen?" (e.g., minutes, hours, or days ago)     Thursday  4. LOCATION: "What part of the body hit the ground?" (e.g., back, buttocks, head, hips, knees, hands, head, stomach)     Left side back of head hit side of wall and tail bone  5. INJURY: "Did you hurt (injure) yourself when you fell?" If Yes, ask: "What did you injure? Tell me more about this?" (e.g., body area; type of injury; pain severity)"     Hit back / side of head and tailbone 6. PAIN: "Is there any pain?" If Yes, ask: "How bad is the pain?" (e.g., Scale 1-10; or mild,  moderate, severe)   - NONE (0): No  pain   - MILD (1-3): Doesn't interfere with normal activities    - MODERATE (4-7): Interferes with normal activities or awakens from sleep    - SEVERE (8-10): Excruciating pain, unable to do any normal activities      severe 7. SIZE: For cuts, bruises, or swelling, ask: "How large is it?" (e.g., inches or centimeters)      Bruise to tailbone  8. PREGNANCY: "Is there any chance you are pregnant?" "When was your last menstrual period?"     na 9. OTHER SYMPTOMS: "Do you have any other symptoms?" (e.g., dizziness, fever, weakness; new onset or worsening).      Still seeing stars , difficulty walking , severe pain due to tailbone and not able to sitting or walk without holding on to something  10. CAUSE: "What do you think caused the fall (or falling)?" (e.g., tripped, dizzy spell)       dizzy  Protocols used: Falls and [facility]

## 2024-01-15 NOTE — Telephone Encounter (Signed)
ER records reviewed - possible sacral fracture

## 2024-01-15 NOTE — Discharge Instructions (Signed)
You were seen for your dizziness and tailbone (sacrum) fracture in the emergency department.   At home, please take Tylenol for your pain.  Take the meclizine for your dizziness.  Please buy a doughnut cushion from a medical supply store or pharmacy to make it more comfortable for you to sit.  You may also take the norco we have prescribed you for any breakthrough pain that may have.  Do not take this before driving or operating heavy machinery.  Do not take this medication with alcohol.  Check your MyChart online for the results of any tests that had not resulted by the time you left the emergency department.   Follow-up with sports medicine in 1 week.  Return immediately to the emergency department if you experience any of the following: Worsening pain, or any other concerning symptoms.    Thank you for visiting our Emergency Department. It was a pleasure taking care of you today.

## 2024-01-15 NOTE — ED Triage Notes (Signed)
Pt states she felt dizzy last Thursday and fell backwards in the bathroom denting the wall.  Reports generalized soreness all over, pain to lower back, and L hip.  Reports + LOC.  States she still feels wobbly.  Denies history of vertigo.

## 2024-02-06 DIAGNOSIS — J449 Chronic obstructive pulmonary disease, unspecified: Secondary | ICD-10-CM | POA: Diagnosis not present

## 2024-02-15 ENCOUNTER — Encounter: Payer: Self-pay | Admitting: Acute Care

## 2024-02-20 ENCOUNTER — Telehealth: Payer: Self-pay | Admitting: Family Medicine

## 2024-02-20 NOTE — Telephone Encounter (Signed)
 Copied from CRM [id number]. Topic: General - Other >> Feb 20, 2024  1:46 PM Kim Braun wrote: Reason for CRM: Patient states she's returning a call to [place] at the clinic states she missed a call from her, no notes or messages in system please reach back out to patient, thanks.   [name] [phone]

## 2024-02-21 NOTE — Telephone Encounter (Signed)
 LVM x2 to schedule AWV with [facility]. Please schedule if patient calls the office.

## 2024-02-21 NOTE — Telephone Encounter (Signed)
 Good morning Kim Braun,  I received your message regarding Kim Braun. Just wanted to point out that, I did put a message in the patients chart. Please look under patients notes, and you will sent the message. That states LVM to schedule AWV with [facility]. If you can't find the message please reach out and I will show you where you can find it. Thanks, hope you have a great day.  [facility] Care Guide [facility] AWV TEAM Direct Dial: [phone]

## 2024-03-08 DIAGNOSIS — J449 Chronic obstructive pulmonary disease, unspecified: Secondary | ICD-10-CM | POA: Diagnosis not present

## 2024-03-18 DIAGNOSIS — M0579 Rheumatoid arthritis with rheumatoid factor of multiple sites without organ or systems involvement: Secondary | ICD-10-CM | POA: Diagnosis not present

## 2024-03-18 DIAGNOSIS — Z681 Body mass index (BMI) 19 or less, adult: Secondary | ICD-10-CM | POA: Diagnosis not present

## 2024-03-18 DIAGNOSIS — M810 Age-related osteoporosis without current pathological fracture: Secondary | ICD-10-CM | POA: Diagnosis not present

## 2024-03-18 DIAGNOSIS — M797 Fibromyalgia: Secondary | ICD-10-CM | POA: Diagnosis not present

## 2024-03-18 DIAGNOSIS — M1991 Primary osteoarthritis, unspecified site: Secondary | ICD-10-CM | POA: Diagnosis not present

## 2024-03-18 DIAGNOSIS — M5030 Other cervical disc degeneration, unspecified cervical region: Secondary | ICD-10-CM | POA: Diagnosis not present

## 2024-03-18 DIAGNOSIS — Z111 Encounter for screening for respiratory tuberculosis: Secondary | ICD-10-CM | POA: Diagnosis not present

## 2024-03-18 DIAGNOSIS — R5383 Other fatigue: Secondary | ICD-10-CM | POA: Diagnosis not present

## 2024-03-19 ENCOUNTER — Other Ambulatory Visit: Payer: Self-pay | Admitting: Acute Care

## 2024-03-19 ENCOUNTER — Other Ambulatory Visit: Payer: Self-pay | Admitting: Internal Medicine

## 2024-03-19 DIAGNOSIS — M810 Age-related osteoporosis without current pathological fracture: Secondary | ICD-10-CM | POA: Insufficient documentation

## 2024-03-19 DIAGNOSIS — Z122 Encounter for screening for malignant neoplasm of respiratory organs: Secondary | ICD-10-CM

## 2024-03-19 DIAGNOSIS — Z87891 Personal history of nicotine dependence: Secondary | ICD-10-CM

## 2024-03-19 DIAGNOSIS — F17210 Nicotine dependence, cigarettes, uncomplicated: Secondary | ICD-10-CM

## 2024-03-20 ENCOUNTER — Telehealth: Payer: Self-pay

## 2024-03-20 ENCOUNTER — Other Ambulatory Visit: Payer: Self-pay | Admitting: Cardiovascular Disease

## 2024-03-20 NOTE — Telephone Encounter (Signed)
 Dr. Ebbie Goldmann, patient will be scheduled as soon as possible.  Auth Submission: APPROVED Site of care: Site of care: [facility] Payer: [employer] Medication & CPT/J Code(s) submitted: Prolia  (Denosumab ) [id number] Route of submission (phone, fax, portal): fax Phone # Fax # [phone]  Auth type: Buy/Bill PB Units/visits requested: 60mg  x 1 dose Reference number: [id number] Approval from: 03/19/24 to 06/17/24

## 2024-03-22 ENCOUNTER — Other Ambulatory Visit: Payer: Self-pay | Admitting: Internal Medicine

## 2024-03-22 NOTE — Addendum Note (Signed)
 Addended by: Onetta Spainhower D on: 03/22/2024 09:01 AM   Modules accepted: Orders

## 2024-03-26 ENCOUNTER — Ambulatory Visit (INDEPENDENT_AMBULATORY_CARE_PROVIDER_SITE_OTHER)

## 2024-03-26 ENCOUNTER — Ambulatory Visit

## 2024-03-26 ENCOUNTER — Telehealth: Payer: Self-pay

## 2024-03-26 VITALS — BP 120/80 | Ht 63.0 in | Wt 105.0 lb

## 2024-03-26 DIAGNOSIS — Z0000 Encounter for general adult medical examination without abnormal findings: Secondary | ICD-10-CM | POA: Diagnosis not present

## 2024-03-26 DIAGNOSIS — Z2821 Immunization not carried out because of patient refusal: Secondary | ICD-10-CM

## 2024-03-26 NOTE — Telephone Encounter (Signed)
 Copied from CRM [id number]. Topic: General - Other >> Mar 26, 2024 12:50 PM Rosamond Comes wrote: Reason for CRM: patient calling in missed the Annual Wellness Visit appt this morning. Patient calling Brook back

## 2024-03-26 NOTE — Progress Notes (Signed)
 Because this visit was a virtual/telehealth visit,  certain criteria was not obtained, such a blood pressure, CBG if applicable, and timed get up and go. Any medications not marked as "taking" were not mentioned during the medication reconciliation part of the visit. Any vitals not documented were not able to be obtained due to this being a telehealth visit or patient was unable to self-report a recent blood pressure reading due to a lack of equipment at home via telehealth. Vitals that have been documented are verbally provided by the patient.  Subjective:   Kim Braun is a 63 y.o. who presents for a Medicare Wellness preventive visit.  Visit Complete: Virtual I connected with  Kim Braun on 03/26/24 by a audio enabled telemedicine application and verified that I am speaking with the correct person using two identifiers.  Patient Location: Home  Provider Location: Home Office  I discussed the limitations of evaluation and management by telemedicine. The patient expressed understanding and agreed to proceed.  Vital Signs: Because this visit was a virtual/telehealth visit, some criteria may be missing or patient reported. Any vitals not documented were not able to be obtained and vitals that have been documented are patient reported.  VideoDeclined- This patient declined Librarian, academic. Therefore the visit was completed with audio only.  Persons Participating in Visit: Patient.  AWV Questionnaire: No: Patient Medicare AWV questionnaire was not completed prior to this visit.  Cardiac Risk Factors include: advanced age (>44men, >61 women);smoking/ tobacco exposure;hypertension     Objective:    Today's Vitals   03/26/24 1615  BP: 120/80  Weight: 105 lb (47.6 kg)  Height: 5\' 3"  (1.6 m)   Body mass index is 18.6 kg/m.     03/26/2024    4:14 PM 03/28/2022    2:03 PM 11/25/2021    7:00 AM 03/26/2021    2:08 PM 09/30/2014   11:39 AM 07/15/2013     1:34 PM 12/19/2012    9:00 PM  Advanced Directives  Does Patient Have a Medical Advance Directive? No No No No No Patient does not have advance directive Patient does not have advance directive  Would patient like information on creating a medical advance directive? No - Patient declined   No - Patient declined No - patient declined information    Pre-existing out of facility DNR order (yellow form or pink MOST form)       No    Current Medications (verified) Outpatient Encounter Medications as of 03/26/2024  Medication Sig   albuterol  (PROVENTIL  HFA;VENTOLIN  HFA) 108 (90 BASE) MCG/ACT inhaler Inhale 2 puffs into the lungs daily as needed for wheezing or shortness of breath.   alendronate (FOSAMAX) 70 MG tablet TAKE 1 TABLET BY MOUTH ONCE WEEKLY 30 MINS PRIOR TO THE FIRST FOOD/BEVERAGE OF THE DAY 28   aspirin  EC 81 MG tablet Take 1 tablet (81 mg total) by mouth daily. Swallow whole.   betamethasone  dipropionate 0.05 % cream APPLY TOPICALLY 2 (TWO) TIMES DAILY AS NEEDED   Calcium  Carbonate-Vit D-Min (CALTRATE PLUS PO) Take by mouth daily.   Etanercept (ENBREL MINI) 50 MG/ML SOCT Inject into the skin.   HYDROcodone -acetaminophen  (NORCO/VICODIN) 5-325 MG tablet Take 2 tablets by mouth every 4 (four) hours as needed.   meclizine  (ANTIVERT ) 12.5 MG tablet Take 1 tablet (12.5 mg total) by mouth 3 (three) times daily as needed for dizziness.   Multiple Vitamins-Minerals (MULTIVITAMIN ADULTS 50+) TABS Take 1 tablet by mouth daily.   nicotine  (NICODERM  CQ) 21 mg/24hr patch Place 1 patch (21 mg total) onto the skin daily.   potassium chloride  (KLOR-CON  M) 10 MEQ tablet Take 1 tablet (10 mEq total) by mouth daily.   rosuvastatin  (CRESTOR ) 20 MG tablet Take 1 tablet (20 mg total) by mouth daily. PLEASE CALL [phone] TO SCHEDULE YEARLY APPOINTMENT PRIOR TO NEXT REFILL REQUEST. THANK YOU.   Tiotropium Bromide Monohydrate  (SPIRIVA  RESPIMAT) 2.5 MCG/ACT AERS Inhale 2 puffs into the lungs daily.   valsartan   (DIOVAN ) 80 MG tablet TAKE 1 TABLET BY MOUTH EVERY DAY   No facility-administered encounter medications on file as of 03/26/2024.    Allergies (verified) Patient has no known allergies.   History: Past Medical History:  Diagnosis Date   C. difficile diarrhea 10/29/2022   10/28/2022 - +C diff treated with PO vancomycin    CAP (community acquired pneumonia) 12/20/2021   COPD (chronic obstructive pulmonary disease) (HCC) 01/2019   COVID-19 virus infection 07/23/2021   History of cardiac murmur as a child    child   History of gestational diabetes    Hypertension    Left bundle branch block    11/2012 low risk, no ischemia, NL EF stress test (Dr. Renna Cary)   Rheumatoid arthritis(714.0)    Smoker    Thyroid  nodule    Past Surgical History:  Procedure Laterality Date   ABDOMINAL HYSTERECTOMY  12/19/2012   Procedure: HYSTERECTOMY ABDOMINAL;  Surgeon: Lizette Righter. Wynona Hedger, MD;  Location: [facility];  Service: Gynecology;  Laterality: N/A;  Incision @ 1517   ANTERIOR CERVICAL DECOMP/DISCECTOMY FUSION N/A 07/17/2013   Cervical four-five, Cervical five-six Anterior cervical decompression/diskectomy/fusion;  Surgeon: Adelbert Adler, MD;  Location: [facility];  Service: Neurosurgery;  Laterality: N/A;  Cervical four-five, Cervical five-six Anterior cervical decompression/diskectomy/fusion   BILATERAL SALPINGECTOMY  12/19/2012   Procedure: BILATERAL SALPINGECTOMY;  Surgeon: Lizette Righter. Wynona Hedger, MD;  Location: [facility];  Service: Gynecology;  Laterality: Bilateral;   CARDIAC CATHETERIZATION  09/30/2014   Procedure: LEFT HEART CATH AND CORONARY ANGIOGRAPHY;  Surgeon: Jessica Morn, MD;  Location[facility];  Service: Cardiovascular;;   CESAREAN SECTION     COLON SURGERY  1992   CYSTOSCOPY  12/19/2012   Procedure: CYSTOSCOPY;  Surgeon: Lizette Righter. Wynona Hedger, MD;  Location: [facility];  Service: Gynecology;  Laterality: N/A;   ROBOTIC ASSISTED TOTAL HYSTERECTOMY  12/19/2012   total hysterectomy for fibroids, ovaries removed  Fritz Jewel)   Family History  Problem Relation Age of Onset   Stroke Mother    CAD Mother        MI   Diabetes Sister    Hypertension Brother    CAD Brother        MI   Hypertension Brother    Hypertension Sister    Breast cancer Sister    Cancer Neg Hx    Social History   Socioeconomic History   Marital status: Married    Spouse name: Not on file   Number of children: Not on file   Years of education: Not on file   Highest education level: Not on file  Occupational History   Not on file  Tobacco Use   Smoking status: Every Day    Current packs/day: 0.50    Average packs/day: 0.5 packs/day for 44.0 years (22.0 ttl pk-yrs)    Types: Cigarettes   Smokeless tobacco: Never   Tobacco comments:    Smoking 3 cigarettes daily-- 07/13/2023 patient is wearing a nicotine  patch  Vaping Use  Vaping status: Never Used  Substance and Sexual Activity   Alcohol use: Yes    Alcohol/week: 14.0 - 24.0 standard drinks of alcohol    Types: 14 - 24 Cans of beer per week   Drug use: No   Sexual activity: Not on file  Other Topics Concern   Not on file  Social History Narrative   Lives with husband [name]: retired, was in Photographer   Activity:    Diet:    Social Drivers of Corporate investment banker Strain: Low Risk  (03/26/2024)   Overall Financial Resource Strain (CARDIA)    Difficulty of Paying Living Expenses: Not hard at all  Food Insecurity: No Food Insecurity (03/26/2024)   Hunger Vital Sign    Worried About Running Out of Food in the Last Year: Never true    Ran Out of Food in the Last Year: Never true  Transportation Needs: No Transportation Needs (03/26/2024)   PRAPARE - Administrator, Civil Service (Medical): No    Lack of Transportation (Non-Medical): No  Physical Activity: Insufficiently Active (03/26/2024)   Exercise Vital Sign    Days of Exercise per Week: 4 days    Minutes of Exercise per Session: 30 min  Stress: No Stress Concern Present  (03/26/2024)   [employer] of Occupational Health - Occupational Stress Questionnaire    Feeling of Stress : Not at all  Social Connections: Socially Integrated (03/26/2024)   Social Connection and Isolation Panel [NHANES]    Frequency of Communication with Friends and Family: More than three times a week    Frequency of Social Gatherings with Friends and Family: More than three times a week    Attends Religious Services: More than 4 times per year    Active Member of [employer] or Organizations: Yes    Attends Engineer, structural: More than 4 times per year    Marital Status: Married    Tobacco Counseling Ready to quit: Not Answered Counseling given: Not Answered Tobacco comments: Smoking 3 cigarettes daily-- 07/13/2023 patient is wearing a nicotine  patch    Clinical Intake:  Pre-visit preparation completed: Yes  Pain : No/denies pain     BMI - recorded: 18.6 Nutritional Status: BMI <19  Underweight Nutritional Risks: None Diabetes: No  Lab Results  Component Value Date   HGBA1C 5.9 03/31/2023   HGBA1C 5.9 (A) 09/23/2022     How often do you need to have someone help you when you read instructions, pamphlets, or other written materials from your doctor or pharmacy?: 1 - Never What is the last grade level you completed in school?: Hs graduate  Interpreter Needed?: No  Information entered by :: Juliann Ochoa   Activities of Daily Living     03/26/2024    4:17 PM  In your present state of health, do you have any difficulty performing the following activities:  Hearing? 0  Vision? 0  Difficulty concentrating or making decisions? 0  Walking or climbing stairs? 0  Dressing or bathing? 0  Doing errands, shopping? 0  Preparing Food and eating ? N  Using the Toilet? N  In the past six months, have you accidently leaked urine? N  Do you have problems with loss of bowel control? N  Managing your Medications? N  Managing your Finances? N   Housekeeping or managing your Housekeeping? N    Patient Care Team: Claire Crick, MD as PCP - General (Family Medicine) Arlee Lace, MD as  Consulting Physician (Obstetrics and Gynecology) Cleve Dale, MD as Consulting Physician (Pulmonary Disease)  Indicate any recent Medical Services you may have received from other than [facility] providers in the past year (date may be approximate).     Assessment:   This is a routine wellness examination for Kim Braun.  Hearing/Vision screen Hearing Screening - Comments:: No hearing issues Vision Screening - Comments:: Patient wears glasses   Goals Addressed             This Visit's Progress    Patient Stated   On track    03/26/2021, I will maintain and continue medications as prescribed.        Depression Screen     03/26/2024    4:18 PM 04/07/2023   10:13 AM 03/28/2022    2:05 PM 03/29/2021   10:17 AM 03/26/2021    2:25 PM 11/18/2020   11:46 AM  PHQ 2/9 Scores  PHQ - 2 Score 0 0 0 0 0 1  PHQ- 9 Score 0 1   0 2    Fall Risk     03/26/2024    4:17 PM 04/07/2023   10:13 AM 03/28/2022    2:05 PM 03/29/2021   10:17 AM 03/26/2021    2:24 PM  Fall Risk   Falls in the past year? 0 0 0 0 0  Number falls in past yr: 0  0  0  Injury with Fall? 0  0  0  Risk for fall due to : No Fall Risks  Medication side effect  Medication side effect  Follow up Falls prevention discussed;Falls evaluation completed  Falls evaluation completed;Education provided;Falls prevention discussed  Falls evaluation completed;Falls prevention discussed    MEDICARE RISK AT HOME:  Medicare Risk at Home Any stairs in or around the home?: Yes If so, are there any without handrails?: No Home free of loose throw rugs in walkways, pet beds, electrical cords, etc?: Yes Adequate lighting in your home to reduce risk of falls?: Yes Life alert?: No Use of a cane, walker or w/c?: No Grab bars in the bathroom?: No Shower chair or bench in shower?: No Elevated toilet seat or a  handicapped toilet?: No  TIMED UP AND GO:  Was the test performed?  No  Cognitive Function: 6CIT completed    03/26/2021    2:25 PM 12/07/2017    8:32 AM  MMSE - Mini Mental State Exam  Not completed: Refused   Orientation to time  5  Orientation to Place  5  Registration  3  Attention/ Calculation  0  Recall  2  Language- name 2 objects  2  Language- repeat  1  Language- follow 3 step command  3  Language- read & follow direction  1  Write a sentence  0  Copy design  0  Total score  22        03/26/2024    4:16 PM 03/28/2022    2:07 PM  6CIT Screen  What Year? 0 points 0 points  What month? 0 points 0 points  What time? 0 points 0 points  Count back from 20 0 points 0 points  Months in reverse 0 points 0 points  Repeat phrase 0 points 4 points  Total Score 0 points 4 points    Immunizations Immunization History  Administered Date(s) Administered   Influenza, Quadrivalent, Recombinant, Inj, Pf 09/08/2017, 08/17/2020, 08/18/2021   Influenza,inj,Quad PF,6-35 Mos 08/16/2019   PFIZER(Purple Top)SARS-COV-2 Vaccination 02/12/2020, 03/04/2020   Pfizer Covid-19  Vaccine Bivalent Booster 18yrs & up 10/14/2021   Pneumococcal Polysaccharide-23 12/20/2012   Td 07/28/2005    Screening Tests Health Maintenance  Topic Date Due   Zoster Vaccines- Shingrix (1 of 2) Never done   Colonoscopy  Never done   Pneumonia Vaccine 8+ Years old (2 of 2 - PCV) 12/20/2013   DTaP/Tdap/Td (2 - Tdap) 07/29/2015   COVID-19 Vaccine (4 - 2024-25 season) 07/30/2023   Lung Cancer Screening  02/24/2024   COLON CANCER SCREENING ANNUAL FOBT  04/11/2024   INFLUENZA VACCINE  06/28/2024   Medicare Annual Wellness (AWV)  03/26/2025   MAMMOGRAM  04/03/2025   DEXA SCAN  Completed   Hepatitis C Screening  Completed   HIV Screening  Completed   HPV VACCINES  Aged Out   Meningococcal B Vaccine  Aged Out    Health Maintenance  Health Maintenance Due  Topic Date Due   Zoster Vaccines- Shingrix (1  of 2) Never done   Colonoscopy  Never done   Pneumonia Vaccine 32+ Years old (2 of 2 - PCV) 12/20/2013   DTaP/Tdap/Td (2 - Tdap) 07/29/2015   COVID-19 Vaccine (4 - 2024-25 season) 07/30/2023   Lung Cancer Screening  02/24/2024   Health Maintenance Items Addressed:patient declined the things in health maintenance  Additional Screening:  Vision Screening: Recommended annual ophthalmology exams for early detection of glaucoma and other disorders of the eye.  Dental Screening: Recommended annual dental exams for proper oral hygiene  Community Resource Referral / Chronic Care Management: CRR required this visit?  No   CCM required this visit?  No     Plan:     I have personally reviewed and noted the following in the patient's chart:   Medical and social history Use of alcohol, tobacco or illicit drugs  Current medications and supplements including opioid prescriptions. Patient is not currently taking opioid prescriptions. Functional ability and status Nutritional status Physical activity Advanced directives List of other physicians Hospitalizations, surgeries, and ER visits in previous 12 months Vitals Screenings to include cognitive, depression, and falls Referrals and appointments  In addition, I have reviewed and discussed with patient certain preventive protocols, quality metrics, and best practice recommendations. A written personalized care plan for preventive services as well as general preventive health recommendations were provided to patient.     Freeda Jerry, [place]   03/26/2024   After Visit Summary: (MyChart) Due to this being a telephonic visit, the after visit summary with patients personalized plan was offered to patient via MyChart   Notes: Nothing significant to report at this time.

## 2024-03-27 NOTE — Telephone Encounter (Signed)
 Lvm to reschedule 1st available is in June

## 2024-03-29 NOTE — Telephone Encounter (Signed)
 There was a message left and the AWV was scheduled for next year, not sure if you can put this on your radar to reschedule for this year.

## 2024-03-31 ENCOUNTER — Other Ambulatory Visit: Payer: Self-pay | Admitting: Family Medicine

## 2024-03-31 DIAGNOSIS — E7800 Pure hypercholesterolemia, unspecified: Secondary | ICD-10-CM

## 2024-03-31 DIAGNOSIS — R7303 Prediabetes: Secondary | ICD-10-CM

## 2024-03-31 DIAGNOSIS — M810 Age-related osteoporosis without current pathological fracture: Secondary | ICD-10-CM

## 2024-03-31 DIAGNOSIS — E041 Nontoxic single thyroid nodule: Secondary | ICD-10-CM

## 2024-04-02 ENCOUNTER — Other Ambulatory Visit (INDEPENDENT_AMBULATORY_CARE_PROVIDER_SITE_OTHER): Payer: PPO

## 2024-04-02 DIAGNOSIS — M810 Age-related osteoporosis without current pathological fracture: Secondary | ICD-10-CM | POA: Diagnosis not present

## 2024-04-02 DIAGNOSIS — E041 Nontoxic single thyroid nodule: Secondary | ICD-10-CM

## 2024-04-02 DIAGNOSIS — R7303 Prediabetes: Secondary | ICD-10-CM

## 2024-04-02 DIAGNOSIS — E7800 Pure hypercholesterolemia, unspecified: Secondary | ICD-10-CM

## 2024-04-02 LAB — LIPID PANEL
Cholesterol: 218 mg/dL — ABNORMAL HIGH (ref 0–200)
HDL: 107 mg/dL (ref >39.00)
LDL Cholesterol: 96 mg/dL (ref 0–99)
NonHDL: 110.84
Total CHOL/HDL Ratio: 2
Triglycerides: 76 mg/dL (ref 0.0–149.0)
VLDL: 15.2 mg/dL (ref 0.0–40.0)

## 2024-04-02 LAB — COMPREHENSIVE METABOLIC PANEL WITH GFR
ALT: 22 U/L (ref 0–35)
AST: 35 U/L (ref 0–37)
Albumin: 4.3 g/dL (ref 3.5–5.2)
Alkaline Phosphatase: 59 U/L (ref 39–117)
BUN: 8 mg/dL (ref 6–23)
CO2: 27 meq/L (ref 19–32)
Calcium: 9.5 mg/dL (ref 8.4–10.5)
Chloride: 98 meq/L (ref 96–112)
Creatinine, Ser: 0.62 mg/dL (ref 0.40–1.20)
GFR: 93.55 mL/min (ref >60.00)
Glucose, Bld: 116 mg/dL — ABNORMAL HIGH (ref 70–99)
Potassium: 4.1 meq/L (ref 3.5–5.1)
Sodium: 136 meq/L (ref 135–145)
Total Bilirubin: 0.9 mg/dL (ref 0.2–1.2)
Total Protein: 6.9 g/dL (ref 6.0–8.3)

## 2024-04-02 LAB — HEMOGLOBIN A1C: Hgb A1c MFr Bld: 6 % (ref 4.6–6.5)

## 2024-04-02 LAB — TSH: TSH: 0.93 u[IU]/mL (ref 0.35–5.50)

## 2024-04-02 LAB — VITAMIN D 25 HYDROXY (VIT D DEFICIENCY, FRACTURES): VITD: 50.34 ng/mL (ref 30.00–100.00)

## 2024-04-02 NOTE — Telephone Encounter (Signed)
 Patient called back the same day and the AWV was completed and signed

## 2024-04-03 ENCOUNTER — Ambulatory Visit
Admission: RE | Admit: 2024-04-03 | Discharge: 2024-04-03 | Disposition: A | Discharge status: Home / Self Care | Source: Ambulatory Visit | Attending: Acute Care | Admitting: Acute Care

## 2024-04-03 DIAGNOSIS — Z87891 Personal history of nicotine dependence: Secondary | ICD-10-CM | POA: Diagnosis not present

## 2024-04-03 DIAGNOSIS — F17210 Nicotine dependence, cigarettes, uncomplicated: Secondary | ICD-10-CM | POA: Diagnosis not present

## 2024-04-03 DIAGNOSIS — Z122 Encounter for screening for malignant neoplasm of respiratory organs: Secondary | ICD-10-CM | POA: Diagnosis not present

## 2024-04-07 DIAGNOSIS — J449 Chronic obstructive pulmonary disease, unspecified: Secondary | ICD-10-CM | POA: Diagnosis not present

## 2024-04-08 ENCOUNTER — Encounter: Payer: PPO | Admitting: Family Medicine

## 2024-04-09 ENCOUNTER — Encounter: Payer: PPO | Admitting: Family Medicine

## 2024-04-18 ENCOUNTER — Ambulatory Visit (INDEPENDENT_AMBULATORY_CARE_PROVIDER_SITE_OTHER)

## 2024-04-18 VITALS — BP 157/82 | HR 98 | Temp 97.8°F | Resp 18 | Ht 63.0 in | Wt 105.2 lb

## 2024-04-18 DIAGNOSIS — M810 Age-related osteoporosis without current pathological fracture: Secondary | ICD-10-CM | POA: Diagnosis not present

## 2024-04-18 MED ORDER — DENOSUMAB 60 MG/ML ~~LOC~~ SOSY
60.0000 mg | PREFILLED_SYRINGE | Freq: Once | SUBCUTANEOUS | Status: AC
  Administered 2024-04-18: 60 mg via SUBCUTANEOUS
  Filled 2024-04-18: qty 1

## 2024-04-18 NOTE — Progress Notes (Signed)
 Diagnosis: Osteoporosis  Provider:  Mannam, Praveen MD  Procedure: Injection  Prolia  (Denosumab ), Dose: 60 mg, Site: subcutaneous, Number of injections: 1  Injection Site(s): Left arm  Post Care: left arm  Discharge: Condition: Good, Destination: Home . AVS Declined  Performed by:  Lauran Pollard, LPN

## 2024-04-29 ENCOUNTER — Encounter: Admitting: Family Medicine

## 2024-04-29 ENCOUNTER — Other Ambulatory Visit: Payer: Self-pay | Admitting: Acute Care

## 2024-04-29 DIAGNOSIS — Z87891 Personal history of nicotine dependence: Secondary | ICD-10-CM

## 2024-04-29 DIAGNOSIS — Z122 Encounter for screening for malignant neoplasm of respiratory organs: Secondary | ICD-10-CM

## 2024-04-29 DIAGNOSIS — F17210 Nicotine dependence, cigarettes, uncomplicated: Secondary | ICD-10-CM

## 2024-05-02 ENCOUNTER — Encounter: Payer: Self-pay | Admitting: Family Medicine

## 2024-05-02 ENCOUNTER — Ambulatory Visit (INDEPENDENT_AMBULATORY_CARE_PROVIDER_SITE_OTHER): Admitting: Family Medicine

## 2024-05-02 VITALS — BP 138/82 | HR 97 | Temp 97.7°F | Ht 62.75 in | Wt 104.2 lb

## 2024-05-02 DIAGNOSIS — Z1211 Encounter for screening for malignant neoplasm of colon: Secondary | ICD-10-CM

## 2024-05-02 DIAGNOSIS — I700 Atherosclerosis of aorta: Secondary | ICD-10-CM

## 2024-05-02 DIAGNOSIS — Z23 Encounter for immunization: Secondary | ICD-10-CM

## 2024-05-02 DIAGNOSIS — M069 Rheumatoid arthritis, unspecified: Secondary | ICD-10-CM

## 2024-05-02 DIAGNOSIS — E7800 Pure hypercholesterolemia, unspecified: Secondary | ICD-10-CM | POA: Diagnosis not present

## 2024-05-02 DIAGNOSIS — J432 Centrilobular emphysema: Secondary | ICD-10-CM

## 2024-05-02 DIAGNOSIS — R7303 Prediabetes: Secondary | ICD-10-CM

## 2024-05-02 DIAGNOSIS — F17200 Nicotine dependence, unspecified, uncomplicated: Secondary | ICD-10-CM

## 2024-05-02 DIAGNOSIS — I10 Essential (primary) hypertension: Secondary | ICD-10-CM | POA: Diagnosis not present

## 2024-05-02 DIAGNOSIS — Z0000 Encounter for general adult medical examination without abnormal findings: Secondary | ICD-10-CM

## 2024-05-02 DIAGNOSIS — Z85828 Personal history of other malignant neoplasm of skin: Secondary | ICD-10-CM

## 2024-05-02 DIAGNOSIS — M810 Age-related osteoporosis without current pathological fracture: Secondary | ICD-10-CM | POA: Diagnosis not present

## 2024-05-02 DIAGNOSIS — I2510 Atherosclerotic heart disease of native coronary artery without angina pectoris: Secondary | ICD-10-CM

## 2024-05-02 DIAGNOSIS — Z7189 Other specified counseling: Secondary | ICD-10-CM

## 2024-05-02 DIAGNOSIS — Z148 Genetic carrier of other disease: Secondary | ICD-10-CM

## 2024-05-02 MED ORDER — ROSUVASTATIN CALCIUM 40 MG PO TABS: 40.0000 mg | ORAL_TABLET | Freq: Every day | ORAL | 3 refills | Status: DC

## 2024-05-02 MED ORDER — VALSARTAN 80 MG PO TABS: 80.0000 mg | ORAL_TABLET | Freq: Every day | ORAL | 3 refills | Status: AC

## 2024-05-02 MED ORDER — POTASSIUM CHLORIDE CRYS ER 10 MEQ PO TBCR: 10.0000 meq | EXTENDED_RELEASE_TABLET | Freq: Every day | ORAL | 3 refills | Status: AC

## 2024-05-02 NOTE — Assessment & Plan Note (Signed)
 Chronic, deteriorated with LDL above goal <55.  Will increase Crestor  to 40mg  daily, monitoring for myalgias.  Encouraged cardiology f/u.  The ASCVD Risk score (Arnett DK, et al., 2019) failed to calculate for the following reasons:   The valid HDL cholesterol range is 20 to 100 mg/dL

## 2024-05-02 NOTE — Assessment & Plan Note (Signed)
Appreciate pulm care.  ?

## 2024-05-02 NOTE — Assessment & Plan Note (Signed)
 Preventative protocols reviewed and updated unless pt declined. Discussed healthy diet and lifestyle.

## 2024-05-02 NOTE — Assessment & Plan Note (Signed)
Encouraged limiting added sugar in diet.  

## 2024-05-02 NOTE — Assessment & Plan Note (Signed)
 Last prolia  shot 03/2024 - had Prolia  reaction. Will f/u with rheum regarding change in therapy.

## 2024-05-02 NOTE — Progress Notes (Signed)
 Ph: [phone] Fax: [phone]   Patient ID: Kim Braun, female    DOB: January 09, 1961, 63 y.o.   MRN: [id number]  This visit was conducted in person.  BP 138/82   Pulse 97   Temp 97.7 F (36.5 C) (Oral)   Ht 5' 2.75" (1.594 m)   Wt 104 lb 4 oz (47.3 kg)   LMP 12/03/2012   SpO2 99%   BMI 18.61 kg/m    CC: CPE Subjective:   HPI: Kim Braun is a 63 y.o. female presenting on 05/02/2024 for Annual Exam (MCR prt 2 [AWV- 03/26/24].)   Saw health advisor 02/2024 for medicare wellness visit. Note reviewed.   No results found.  Flowsheet Row Office Visit from 05/02/2024 in [facility] [place]  PHQ-2 Total Score 0          05/02/2024    8:35 AM 03/26/2024    4:17 PM 04/07/2023   10:13 AM 03/28/2022    2:05 PM 03/29/2021   10:17 AM  Fall Risk   Falls in the past year? 1 0 0 0 0  Number falls in past yr:  0  0   Injury with Fall?  0  0   Risk for fall due to :  No Fall Risks  Medication side effect   Follow up  Falls prevention discussed;Falls evaluation completed  Falls evaluation completed;Education provided;Falls prevention discussed   Kim Braun 12/2023 - woke up overnight and ?passed out in bathroom after urinating - evaluated at ER with lower sacral fracture - this is slowly healing. No further syncopal spells. ?syncope vs vertigo.   Known rheumatoid arthritis on Humira - rheumatologist is Kim Braun.   OP - on fosamax 70mg  weekly - now on Prolia . Latest DEXA 07/2020 through rheum. Takes calcium /vit D + vit D. Lactose intolerant - avoids dairy. She had reaction to Prolia  - labored breathing, internal shaking x3 days - wants to avoid Prolia .   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   CAD - heavy 3v coronary calcification on CT scan - saw cardiology 12/2021 s/p normal stress test 12/2021.    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     Preventative: Colon cancer screening - check cologuard this year.  [facility] - will call to schedule appt. Fmhx breast cancer (sister)  Well woman exam - s/p total hysterectomy (Kim Braun), ovaries removed as well.  DEXA scan 07/2020 through Rheum on fosamax - latest Prolia  shot with 04/18/2024 - had reaction. Will discuss change in regimen with rheum Lung cancer screening - yearly, first 12/2020, latest 03/2024 Flu shot - yearly COVID vaccine Pfizer 01/2020, 02/2020, bivalent 09/2021 Td 2006  Pneumovax 2014, prevnar-20 today.  Shingrix - discussed. Has not had chicken pox.  RSV - discussed  Advanced directive discussion - doesn't have this at home. Husband Kim Braun would be HCPOA. Doesn't want prolonged life support if terminal condition. Packet provided previously.  Seat belt use discussed  Sunscreen use discussed. No changing moles on skin. due for derm f/u. S/p skin cancer removed from bottom lip several years ago. Smoker - 8/day Alcohol - beer/wine <1/day  Dentist - yearly - needs to schedule  Eye exam - yearly  Bowel - no constipation  Bladder - no incontinence    Lives with husband Kim Braun: retired, was in [employer] Activity:  no regular exercise  Diet: good water , fruits/vegetables daily      Relevant past medical, surgical, family and social history reviewed and updated as indicated. Interim medical history since our last visit reviewed. Allergies and medications reviewed and updated. Outpatient Medications Prior to Visit  Medication Sig Dispense Refill   albuterol  (PROVENTIL  HFA;VENTOLIN  HFA) 108 (90 BASE) MCG/ACT inhaler Inhale 2 puffs into the lungs daily as needed for wheezing or shortness of breath.     alendronate (FOSAMAX) 70 MG tablet TAKE 1 TABLET BY MOUTH ONCE WEEKLY 30 MINS PRIOR TO THE FIRST FOOD/BEVERAGE OF THE DAY 28 12 tablet 4   aspirin  EC 81 MG tablet Take 1 tablet (81 mg total) by mouth daily. Swallow whole. 90 tablet 3    betamethasone  dipropionate 0.05 % cream APPLY TOPICALLY 2 (TWO) TIMES DAILY AS NEEDED 30 g 0   Calcium  Carbonate-Vit D-Min (CALTRATE PLUS PO) Take by mouth daily.     Etanercept (ENBREL MINI) 50 MG/ML SOCT Inject into the skin.     HYDROcodone -acetaminophen  (NORCO/VICODIN) 5-325 MG tablet Take 2 tablets by mouth every 4 (four) hours as needed. 15 tablet 0   meclizine  (ANTIVERT ) 12.5 MG tablet Take 1 tablet (12.5 mg total) by mouth 3 (three) times daily as needed for dizziness. 15 tablet 0   Multiple Vitamins-Minerals (MULTIVITAMIN ADULTS 50+) TABS Take 1 tablet by mouth daily.     nicotine  (NICODERM CQ ) 21 mg/24hr patch Place 1 patch (21 mg total) onto the skin daily. 28 patch 0   Tiotropium Bromide Monohydrate  (SPIRIVA  RESPIMAT) 2.5 MCG/ACT AERS Inhale 2 puffs into the lungs daily. 4 g 11   potassium chloride  (KLOR-CON  M) 10 MEQ tablet Take 1 tablet (10 mEq total) by mouth daily. 90 tablet 4   rosuvastatin  (CRESTOR ) 20 MG tablet Take 1 tablet (20 mg total) by mouth daily. PLEASE CALL [phone] TO SCHEDULE YEARLY APPOINTMENT PRIOR TO NEXT REFILL REQUEST. THANK YOU. 90 tablet 0   valsartan  (DIOVAN ) 80 MG tablet TAKE 1 TABLET BY MOUTH EVERY DAY 90 tablet 4   No facility-administered medications prior to visit.     Per HPI unless specifically indicated in ROS section below Review of Systems  Constitutional:  Negative for activity change, appetite change, chills, fatigue, fever and unexpected weight change.  HENT:  Negative for hearing loss.   Eyes:  Negative for visual disturbance.  Respiratory:  Positive for cough and shortness of breath (occ). Negative for chest tightness and wheezing.   Cardiovascular:  Negative for chest pain, palpitations and leg swelling.  Gastrointestinal:  Negative for abdominal distention, abdominal pain, blood in stool, constipation, diarrhea, nausea and vomiting.  Genitourinary:  Negative for difficulty urinating and hematuria.  Musculoskeletal:  Negative for  arthralgias, myalgias and neck pain.  Skin:  Negative for rash.  Neurological:  Negative for dizziness, seizures, syncope and headaches.  Hematological:  Negative for adenopathy. Bruises/bleeds easily.  Psychiatric/Behavioral:  Negative for dysphoric mood. The patient is not nervous/anxious.     Objective:  BP 138/82   Pulse 97   Temp 97.7 F (36.5 C) (Oral)   Ht 5' 2.75" (1.594 m)   Wt 104 lb 4 oz (47.3 kg)   LMP 12/03/2012   SpO2 99%   BMI 18.61 kg/m   Wt Readings from Last 3 Encounters:  05/02/24 104 lb 4 oz (47.3 kg)  04/18/24 105 lb 3.2 oz (47.7 kg)  03/26/24 105 lb (47.6 kg)      Physical Exam Vitals and nursing note reviewed.  Constitutional:  Appearance: Normal appearance. She is not ill-appearing.  HENT:     Head: Normocephalic and atraumatic.     Right Ear: Tympanic membrane, ear canal and external ear normal. There is no impacted cerumen.     Braun Ear: Tympanic membrane, ear canal and external ear normal. There is no impacted cerumen.     Mouth/Throat:     Mouth: Mucous membranes are moist.     Pharynx: Oropharynx is clear. No oropharyngeal exudate or posterior oropharyngeal erythema.  Eyes:     General:        Right eye: No discharge.        Braun eye: No discharge.     Extraocular Movements: Extraocular movements intact.     Conjunctiva/sclera: Conjunctivae normal.     Pupils: Pupils are equal, round, and reactive to light.  Neck:     Thyroid : No thyroid  mass or thyromegaly.     Vascular: No carotid bruit.  Cardiovascular:     Rate and Rhythm: Normal rate and regular rhythm.     Pulses: Normal pulses.     Heart sounds: Normal heart sounds. No murmur heard. Pulmonary:     Effort: Pulmonary effort is normal. No respiratory distress.     Breath sounds: Normal breath sounds. No wheezing, rhonchi or rales.  Abdominal:     General: Bowel sounds are normal. There is no distension.     Palpations: Abdomen is soft. There is no mass.     Tenderness: There  is no abdominal tenderness. There is no guarding or rebound.     Hernia: No hernia is present.  Musculoskeletal:     Cervical back: Normal range of motion and neck supple. No rigidity.     Right lower leg: No edema.     Braun lower leg: No edema.  Lymphadenopathy:     Cervical: No cervical adenopathy.  Skin:    General: Skin is warm and dry.     Findings: No rash.  Neurological:     General: No focal deficit present.     Mental Status: She is alert. Mental status is at baseline.  Psychiatric:        Mood and Affect: Mood normal.        Behavior: Behavior normal.       Results for orders placed or performed in visit on 04/02/24  TSH   Collection Time: 04/02/24  7:58 AM  Result Value Ref Range   TSH 0.93 0.35 - 5.50 uIU/mL  Hemoglobin A1c   Collection Time: 04/02/24  7:58 AM  Result Value Ref Range   Hgb A1c MFr Bld 6.0 4.6 - 6.5 %  VITAMIN D  25 Hydroxy (Vit-D Deficiency, Fractures)   Collection Time: 04/02/24  7:58 AM  Result Value Ref Range   VITD 50.34 30.00 - 100.00 ng/mL  Comprehensive metabolic panel with GFR   Collection Time: 04/02/24  7:58 AM  Result Value Ref Range   Sodium 136 135 - 145 mEq/L   Potassium 4.1 3.5 - 5.1 mEq/L   Chloride 98 96 - 112 mEq/L   CO2 27 19 - 32 mEq/L   Glucose, Bld 116 (H) 70 - 99 mg/dL   BUN 8 6 - 23 mg/dL   Creatinine, Ser 1.91 0.40 - 1.20 mg/dL   Total Bilirubin 0.9 0.2 - 1.2 mg/dL   Alkaline Phosphatase 59 39 - 117 U/L   AST 35 0 - 37 U/L   ALT 22 0 - 35 U/L   Total Protein 6.9 6.0 -  8.3 g/dL   Albumin 4.3 3.5 - 5.2 g/dL   GFR 78.29 >56.21 mL/min   Calcium  9.5 8.4 - 10.5 mg/dL  Lipid panel   Collection Time: 04/02/24  7:58 AM  Result Value Ref Range   Cholesterol 218 (H) 0 - 200 mg/dL   Triglycerides 30.8 0.0 - 149.0 mg/dL   HDL 657.84 >69.62 mg/dL   VLDL 95.2 0.0 - 84.1 mg/dL   LDL Cholesterol 96 0 - 99 mg/dL   Total CHOL/HDL Ratio 2    NonHDL 110.84     Assessment & Plan:   Problem List Items Addressed This Visit      Health maintenance examination - Primary (Chronic)   Preventative protocols reviewed and updated unless pt declined. Discussed healthy diet and lifestyle.       Advanced directives, counseling/discussion (Chronic)   She will continue working on this - asked to bring us  a copy      Centrilobular emphysema (HCC)   Appreciate pulm care.      Hypertension   Chronic, stable. Continue current regimen.       Relevant Medications   rosuvastatin  (CRESTOR ) 40 MG tablet   valsartan  (DIOVAN ) 80 MG tablet   Rheumatoid arthritis (HCC)   On Enbrel through rheum      Smoker   Encouraged full cessation. Continue lung cancer screening CT.       Osteoporosis   Followed by rheumatology.  Had reaction to Prolia .  Will discuss with rheum at upcoming appt.       Atherosclerosis of aorta (HCC)   Increase statin as per below.       Relevant Medications   rosuvastatin  (CRESTOR ) 40 MG tablet   valsartan  (DIOVAN ) 80 MG tablet   Hyperlipidemia   Chronic, deteriorated with LDL above goal <55.  Will increase Crestor  to 40mg  daily, monitoring for myalgias.  Encouraged cardiology f/u.  The ASCVD Risk score (Arnett DK, et al., 2019) failed to calculate for the following reasons:   The valid HDL cholesterol range is 20 to 100 mg/dL       Relevant Medications   rosuvastatin  (CRESTOR ) 40 MG tablet   valsartan  (DIOVAN ) 80 MG tablet   Prediabetes   Encouraged limiting added sugar in diet.       CAD (coronary artery disease)   Sees card - has not been taking aspirin  - will start.  Will also increase crestor  as per above.       Relevant Medications   rosuvastatin  (CRESTOR ) 40 MG tablet   valsartan  (DIOVAN ) 80 MG tablet   Carrier of hemochromatosis HFE gene mutation   Age-related osteoporosis without current pathological fracture   Last prolia  shot 03/2024 - had Prolia  reaction. Will f/u with rheum regarding change in therapy.       Other Visit Diagnoses       Special screening  for malignant neoplasms, colon       Relevant Orders   Cologuard     History of skin cancer       Relevant Orders   Ambulatory referral to Dermatology        Meds ordered this encounter  Medications   potassium chloride  (KLOR-CON  M) 10 MEQ tablet    Sig: Take 1 tablet (10 mEq total) by mouth daily.    Dispense:  90 tablet    Refill:  3   rosuvastatin  (CRESTOR ) 40 MG tablet    Sig: Take 1 tablet (40 mg total) by mouth daily.    Dispense:  90  tablet    Refill:  3   valsartan  (DIOVAN ) 80 MG tablet    Sig: Take 1 tablet (80 mg total) by mouth daily.    Dispense:  90 tablet    Refill:  3    Orders Placed This Encounter  Procedures   Cologuard   Ambulatory referral to Dermatology    Referral Priority:   Routine    Referral Type:   Consultation    Referral Reason:   Specialty Services Required    Requested Specialty:   Dermatology    Number of Visits Requested:   1    Patient Instructions  We will sign you up for cologuard. Expect a kit in the mail.  Prevnar-20 today.  Increase Crestor  to 40mg  daily.  Start taking baby aspirin  81mg .  Work on advanced directives, bring us  copy of living will when completed I will refer you back to dermatologist.  Return as needed or in 1 year for next physical/wellness visit  Follow up plan: Return in about 1 year (around 05/02/2025) for annual exam, prior fasting for blood work, medicare wellness visit.  Claire Crick, MD

## 2024-05-02 NOTE — Patient Instructions (Addendum)
 We will sign you up for cologuard. Expect a kit in the mail.  Prevnar-20 today.  Increase Crestor  to 40mg  daily.  Start taking baby aspirin  81mg .  Work on advanced directives, bring us  copy of living will when completed I will refer you back to dermatologist.  Return as needed or in 1 year for next physical/wellness visit

## 2024-05-02 NOTE — Assessment & Plan Note (Signed)
 Chronic, stable. Continue current regimen.

## 2024-05-02 NOTE — Assessment & Plan Note (Signed)
 She will continue working on this - asked to bring us  a copy

## 2024-05-02 NOTE — Addendum Note (Signed)
 Addended by: Devine Klingel on: 05/02/2024 09:42 AM   Modules accepted: Orders

## 2024-05-02 NOTE — Assessment & Plan Note (Signed)
 Followed by rheumatology.  Had reaction to Prolia .  Will discuss with rheum at upcoming appt.

## 2024-05-02 NOTE — Assessment & Plan Note (Addendum)
 Sees card - has not been taking aspirin  - will start.  Will also increase crestor  as per above.

## 2024-05-02 NOTE — Assessment & Plan Note (Signed)
 On Enbrel through rheum

## 2024-05-02 NOTE — Assessment & Plan Note (Signed)
 Increase statin as per below.

## 2024-05-02 NOTE — Assessment & Plan Note (Signed)
 Encouraged full cessation. Continue lung cancer screening CT.

## 2024-05-03 ENCOUNTER — Ambulatory Visit

## 2024-05-08 DIAGNOSIS — J449 Chronic obstructive pulmonary disease, unspecified: Secondary | ICD-10-CM | POA: Diagnosis not present

## 2024-05-08 DIAGNOSIS — Z1211 Encounter for screening for malignant neoplasm of colon: Secondary | ICD-10-CM | POA: Diagnosis not present

## 2024-05-14 ENCOUNTER — Ambulatory Visit: Payer: Self-pay | Admitting: Family Medicine

## 2024-05-14 LAB — COLOGUARD: COLOGUARD: NEGATIVE

## 2024-05-24 ENCOUNTER — Ambulatory Visit

## 2024-06-07 DIAGNOSIS — J449 Chronic obstructive pulmonary disease, unspecified: Secondary | ICD-10-CM | POA: Diagnosis not present

## 2024-07-08 DIAGNOSIS — J449 Chronic obstructive pulmonary disease, unspecified: Secondary | ICD-10-CM | POA: Diagnosis not present

## 2024-08-08 DIAGNOSIS — J449 Chronic obstructive pulmonary disease, unspecified: Secondary | ICD-10-CM | POA: Diagnosis not present

## 2024-08-09 ENCOUNTER — Other Ambulatory Visit: Payer: Self-pay | Admitting: Family Medicine

## 2024-08-09 ENCOUNTER — Other Ambulatory Visit: Payer: Self-pay | Admitting: Cardiovascular Disease

## 2024-08-09 ENCOUNTER — Ambulatory Visit: Payer: Self-pay

## 2024-08-09 MED ORDER — ROSUVASTATIN CALCIUM 20 MG PO TABS: 20.0000 mg | ORAL_TABLET | Freq: Every day | ORAL | 3 refills | Status: AC

## 2024-08-09 NOTE — Telephone Encounter (Signed)
 Noted. Plz notify Crestor  dose has been dropped back to 20mg  tablets. Let us  know if any trouble on lower dose.

## 2024-08-09 NOTE — Telephone Encounter (Signed)
 I spoke with pt; pt said shortly after starting rosuvastatin  40 mg began with entire both legs having throbbing pain; no swelling or redness in legs. No CP or SOB. Legs continue to have throbbing pain and pt declines appt but wants rosuvastatin  changed back to 20 mg. UC & ED precautions given and pt voiced understanding,. Sending note to Dr KANDICE. [facility]  [place].

## 2024-08-09 NOTE — Telephone Encounter (Signed)
 FYI Only or Action Required?: Action required by provider: clinical question for provider.  Patient was last seen in primary care on 05/02/2024 by Kim Baller, MD.  Called Nurse Triage reporting Leg Pain and Medication Problem (Possibly crestor  causing pain).  Symptoms began several days ago.  Interventions attempted: Prescription medications: decreased rosuvastatin  to 20mg ; herself.  Symptoms are: unchanged.  Triage Disposition: See PCP When Office is Open (Within 3 Days)  Patient/caregiver understands and will follow disposition?: Yes     Copied from CRM #[id number]. Topic: Clinical - Red Word Triage >> Aug 09, 2024  9:40 AM Kim Braun wrote: [facility] that prompted transfer to Nurse Triage: Patient is having really bad pain and muscle cramps in her legs, , Reason for Disposition  [1] MODERATE pain (e.g., interferes with normal activities, limping) AND [2] present > 3 days  Answer Assessment - Initial Assessment Questions Patient declines appt and request call back. Advised UC/ED if symptoms worsen.  Pt reports Last took rosuvastatin  40mg  yesterday, today pt only took rosuvastatin  20mg .  1. ONSET: When did the pain start?      Few days after taking med; pharmacist said I may have issues; went from 20mg  to 40 mg, I ache all the time 2. LOCATION: Where is the pain located?      Both legs; top and bottom; hurting sleeping 3. PAIN: How bad is the pain?    (Scale 1-10; or mild, moderate, severe)     5/10 4. WORK OR EXERCISE: Has there been any recent work or exercise that involved this part of the body?      na 5. CAUSE: What do you think is causing the leg pain?     Rosuvastatin  increase from 20mg  to 40mg . I had no problems when taking 20 mg 6. OTHER SYMPTOMS: Do you have any other symptoms? (e.g., chest pain, back pain, breathing difficulty, swelling, rash, fever, numbness, weakness)      Denies swelling, discoloration, redness, diff breathing, fever,  numbness/weakness, chest pain, rash  Protocols used: Leg Pain-A-AH

## 2024-09-05 ENCOUNTER — Ambulatory Visit: Admitting: Dermatology

## 2024-09-05 ENCOUNTER — Encounter: Payer: Self-pay | Admitting: Dermatology

## 2024-09-05 DIAGNOSIS — W908XXA Exposure to other nonionizing radiation, initial encounter: Secondary | ICD-10-CM | POA: Diagnosis not present

## 2024-09-05 DIAGNOSIS — D1801 Hemangioma of skin and subcutaneous tissue: Secondary | ICD-10-CM

## 2024-09-05 DIAGNOSIS — L578 Other skin changes due to chronic exposure to nonionizing radiation: Secondary | ICD-10-CM

## 2024-09-05 DIAGNOSIS — L821 Other seborrheic keratosis: Secondary | ICD-10-CM

## 2024-09-05 DIAGNOSIS — L814 Other melanin hyperpigmentation: Secondary | ICD-10-CM | POA: Diagnosis not present

## 2024-09-05 DIAGNOSIS — D485 Neoplasm of uncertain behavior of skin: Secondary | ICD-10-CM | POA: Diagnosis not present

## 2024-09-05 DIAGNOSIS — D229 Melanocytic nevi, unspecified: Secondary | ICD-10-CM

## 2024-09-05 DIAGNOSIS — Z1283 Encounter for screening for malignant neoplasm of skin: Secondary | ICD-10-CM

## 2024-09-05 DIAGNOSIS — L570 Actinic keratosis: Secondary | ICD-10-CM | POA: Diagnosis not present

## 2024-09-05 NOTE — Progress Notes (Unsigned)
 New Patient Visit   Subjective  Kim Braun is a 63 y.o. female who presents for the following: Skin Cancer Screening and Full Body Skin Exam  The patient presents for Total-Body Skin Exam (TBSE) for skin cancer screening and mole check. The patient has spots, moles and lesions to be evaluated, some may be new or changing.  The following portions of the chart were reviewed this encounter and updated as appropriate: medications, allergies, medical history  Review of Systems:  No other skin or systemic complaints except as noted in HPI or Assessment and Plan.  Objective  Well appearing patient in no apparent distress; mood and affect are within normal limits.  A full examination was performed including scalp, head, eyes, ears, nose, lips, neck, chest, axillae, abdomen, back, buttocks, bilateral upper extremities, bilateral lower extremities, hands, feet, fingers, toes, fingernails, and toenails. All findings within normal limits unless otherwise noted below.   Relevant physical exam findings are noted in the Assessment and Plan.  Left Upper Arm - Posterior 4mm irregular brown macule  Left Forearm - Posterior/hand x 5, nose x 2, (7), Neck - Posterior Erythematous thin papules/macules with gritty scale.   Assessment & Plan   SKIN CANCER SCREENING PERFORMED TODAY.  ACTINIC DAMAGE - Chronic condition, secondary to cumulative UV/sun exposure - diffuse scaly erythematous macules with underlying dyspigmentation - Recommend daily broad spectrum sunscreen SPF 30+ to sun-exposed areas, reapply every 2 hours as needed.  - Staying in the shade or wearing long sleeves, sun glasses (UVA+UVB protection) and wide brim hats (4-inch brim around the entire circumference of the hat) are also recommended for sun protection.  - Call for new or changing lesions.  MELANOCYTIC NEVI - Tan-brown and/or pink-flesh-colored symmetric macules and papules - Benign appearing on exam today - Observation -  Call clinic for new or changing moles - Recommend daily use of broad spectrum spf 30+ sunscreen to sun-exposed areas.   LENTIGINES Exam: scattered tan macules Due to sun exposure Treatment Plan: Benign-appearing, observe. Recommend daily broad spectrum sunscreen SPF 30+ to sun-exposed areas, reapply every 2 hours as needed.  Call for any changes   HEMANGIOMA Exam: red papule(s) Discussed benign nature. Recommend observation. Call for changes.   SEBORRHEIC KERATOSIS - Stuck-on, waxy, tan-brown papules and/or plaques  - Benign-appearing - Discussed benign etiology and prognosis. - Observe - Call for any changes NEOPLASM OF UNCERTAIN BEHAVIOR OF SKIN Left Upper Arm - Posterior Skin / nail biopsy Type of biopsy: tangential   Informed consent: discussed and consent obtained   Timeout: patient name, date of birth, surgical site, and procedure verified   Procedure prep:  Patient was prepped and draped in usual sterile fashion Prep type:  Isopropyl alcohol Anesthesia: the lesion was anesthetized in a standard fashion   Anesthetic:  1% lidocaine  w/ epinephrine 1-100,000 buffered w/ 8.4% NaHCO3 Instrument used: DermaBlade   Hemostasis achieved with: aluminum chloride   Outcome: patient tolerated procedure well   Post-procedure details: sterile dressing applied and wound care instructions given   Dressing type: bandage and petrolatum    Specimen 1 - Surgical pathology Differential Diagnosis: R/O MM vs DN vs SK  Check Margins: No AK (ACTINIC KERATOSIS) (8) Left Forearm - Posterior/hand x 5, nose x 2, (7), Neck - Posterior Destruction of lesion - Left Forearm - Posterior/hand x 5, nose x 2, (7), Neck - Posterior Complexity: simple   Destruction method: cryotherapy   Outcome: patient tolerated procedure well with no complications   Post-procedure details:  wound care instructions given    ACTINIC SKIN DAMAGE   MULTIPLE BENIGN NEVI   LENTIGINES   CHERRY ANGIOMA   SEBORRHEIC  KERATOSIS    Return in about 6 months (around 03/06/2025) for TBSE.  I, Darice Smock, CMA, am acting as scribe for RUFUS CHRISTELLA HOLY, MD.   Documentation: I have reviewed the above documentation for accuracy and completeness, and I agree with the above.  RUFUS CHRISTELLA HOLY, MD

## 2024-09-05 NOTE — Patient Instructions (Addendum)

## 2024-09-06 LAB — SURGICAL PATHOLOGY

## 2024-09-09 ENCOUNTER — Ambulatory Visit: Payer: Self-pay | Admitting: Dermatology

## 2024-09-12 ENCOUNTER — Other Ambulatory Visit (HOSPITAL_BASED_OUTPATIENT_CLINIC_OR_DEPARTMENT_OTHER): Payer: Self-pay | Admitting: Internal Medicine

## 2024-09-12 DIAGNOSIS — M1991 Primary osteoarthritis, unspecified site: Secondary | ICD-10-CM | POA: Diagnosis not present

## 2024-09-12 DIAGNOSIS — M810 Age-related osteoporosis without current pathological fracture: Secondary | ICD-10-CM | POA: Diagnosis not present

## 2024-09-12 DIAGNOSIS — M0579 Rheumatoid arthritis with rheumatoid factor of multiple sites without organ or systems involvement: Secondary | ICD-10-CM

## 2024-09-12 DIAGNOSIS — Z681 Body mass index (BMI) 19 or less, adult: Secondary | ICD-10-CM | POA: Diagnosis not present

## 2024-09-12 DIAGNOSIS — M797 Fibromyalgia: Secondary | ICD-10-CM | POA: Diagnosis not present

## 2024-09-12 DIAGNOSIS — M5030 Other cervical disc degeneration, unspecified cervical region: Secondary | ICD-10-CM | POA: Diagnosis not present

## 2024-09-13 LAB — LAB REPORT - SCANNED: EGFR: 93

## 2024-10-09 ENCOUNTER — Encounter: Payer: Self-pay | Admitting: Internal Medicine

## 2024-10-09 ENCOUNTER — Ambulatory Visit: Admitting: Internal Medicine

## 2024-10-09 VITALS — BP 100/60 | HR 88 | Temp 98.9°F | Ht 62.0 in | Wt 107.2 lb

## 2024-10-09 DIAGNOSIS — F17210 Nicotine dependence, cigarettes, uncomplicated: Secondary | ICD-10-CM | POA: Diagnosis not present

## 2024-10-09 DIAGNOSIS — J449 Chronic obstructive pulmonary disease, unspecified: Secondary | ICD-10-CM | POA: Diagnosis not present

## 2024-10-09 DIAGNOSIS — L853 Xerosis cutis: Secondary | ICD-10-CM

## 2024-10-09 DIAGNOSIS — J9611 Chronic respiratory failure with hypoxia: Secondary | ICD-10-CM | POA: Diagnosis not present

## 2024-10-09 DIAGNOSIS — Z9981 Dependence on supplemental oxygen: Secondary | ICD-10-CM

## 2024-10-09 MED ORDER — BETAMETHASONE DIPROPIONATE 0.05 % EX CREA: TOPICAL_CREAM | Freq: Two times a day (BID) | CUTANEOUS | 0 refills | Status: AC

## 2024-10-09 NOTE — Patient Instructions (Signed)
 Continue oxygen  as prescribed Continue inhalers with Spiriva  Respimat as prescribed Recommend stop smoking Nicotine  patches 21 mg every 24 hours  Avoid Allergens and Irritants Avoid secondhand smoke Avoid SICK contacts Recommend  Masking  when appropriate Recommend Keep up-to-date with vaccinations

## 2024-10-09 NOTE — Progress Notes (Signed)
 Name: CALAH GERSHMAN MRN: [id number] DOB: 14-Jan-1961     CONSULTATION DATE: 10/09/2024  REFERRING MD : Dr. Darra  SYNOPSIS 63 year old pleasant white female seen today to establish care for assessment for COPD Patient is an active smoker of 1/2 pack/day for the last 40 to 50 years  She has been diagnosed with COPD 2 years ago Patient supposedly had a diagnosis of pneumonia at the same time   Patient has chronic shortness of breath chronic dyspnea on exertion Patient has a productive cough for many years which is consistent with chronic bronchitis PFTs 2023 MILD/moderate obstructive lung disease  CHIEF COMPLAINT: Follow-up assessment of COPD  HPI No exacerbation at this time No evidence of heart failure at this time No evidence or signs of infection at this time No respiratory distress No fevers, chills, nausea, vomiting, diarrhea No evidence of lower extremity edema No evidence hemoptysis   Current inhaler regimen includes Continue Spiriva  Respimat 2.5  Smoking Assessment and Cessation Counseling Upon further questioning, Patient smokes 1 ppd I have advised patient to quit/stop smoking as soon as possible due to high risk for multiple medical problems Patient is willing to quit smoking I have advised patient that we can assist and have options of Nicotine  replacement therapy. I also advised patient on behavioral therapy and can provide oral medication therapy in conjunction with the other therapies Follow up next Office visit  for assessment of smoking cessation Smoking cessation counseling advised for 4 minutes Nicotine  patches ordered  Continue oxygen  therapy as prescribed Patient needs this for survival  Low-dose CT chest 2023 Extensive bullous emphysema No lung nodules or masses noted  Low-dose CT chest MAY 2025 Extensive bullous emphysema No lung nodules or masses noted Reviewed with patient in detail  PAST MEDICAL HISTORY :   has a past medical  history of C. difficile diarrhea (10/29/2022), C. difficile diarrhea (10/29/2022), CAP (community acquired pneumonia) (12/20/2021), COPD (chronic obstructive pulmonary disease) (HCC) (01/2019), COVID-19 virus infection (07/23/2021), History of cardiac murmur as a child, History of gestational diabetes, Hypertension, Left bundle branch block, Rheumatoid arthritis(714.0), Smoker, and Thyroid  nodule.  has a past surgical history that includes Cesarean section; Colon surgery (1992); Robotic assisted total hysterectomy (12/19/2012); Bilateral salpingectomy (12/19/2012); Abdominal hysterectomy (12/19/2012); Cystoscopy (12/19/2012); Anterior cervical decomp/discectomy fusion (N/A, 07/17/2013); and Cardiac catheterization (09/30/2014). Prior to Admission medications   Medication Sig Start Date End Date Taking? Authorizing Provider  Adalimumab (HUMIRA PEN) 40 MG/0.4ML PNKT Inject 40mg  subcutaneously every *OTHER week as directed 02/05/20  Yes [provider]  albuterol  (PROVENTIL  HFA;VENTOLIN  HFA) 108 (90 BASE) MCG/ACT inhaler Inhale 2 puffs into the lungs daily as needed for wheezing or shortness of breath.   Yes [provider]  alendronate (FOSAMAX) 70 MG tablet Take 70 mg by mouth once a week. 11/12/20  Yes [provider]  betamethasone  dipropionate 0.05 % cream Apply topically 2 (two) times daily.   Yes [provider]  Biotin 10 MG TABS 1 tablet   Yes [provider]  CALCIUM -VITAMIN D  PO Take by mouth daily. Calcium  600 mg + vit D3   Yes [provider]  Cholecalciferol (VITAMIN D3 PO) Take 2,000 Units by mouth.   Yes [provider]  KLOR-CON  M20 20 MEQ tablet Take 20 mEq by mouth daily. 09/14/20  Yes [provider]  L-Lysine 500 MG TABS Take by mouth daily.   Yes [provider]  losartan (COZAAR) 100 MG tablet Take 100 mg by mouth daily. 11/10/17  Yes  [provider]  SPIRIVA  HANDIHALER 18 MCG inhalation capsule 1  capsule daily. 08/24/20  Yes [provider]   No Known Allergies  FAMILY HISTORY:  family history includes Breast cancer in her sister; CAD in her brother and mother; Diabetes in her sister; Hypertension in her brother, brother, and sister; Stroke in her mother. SOCIAL HISTORY:  reports that she has been smoking cigarettes. She has a 22 pack-year smoking history. She has never used smokeless tobacco. She reports current alcohol use of about 14.0 - 24.0 standard drinks of alcohol per week. She reports that she does not use drugs.  BP 100/60   Pulse 88   Temp 98.9 F (37.2 C)   Ht 5' 2 (1.575 m)   Wt 107 lb 3.2 oz (48.6 kg)   LMP 12/03/2012   SpO2 99%   BMI 19.61 kg/m      Physical Examination:  General Appearance: No distress  EYES EOM intact.   NECK Supple, No JVD Pulmonary: normal breath sounds, No wheezing.  CardiovascularNormal S1,S2.  No m/r/g.   Ext pulses intact, cap refill intact  ALL OTHER ROS ARE NEGATIVE    ASSESSMENT AND PLAN SYNOPSIS 63 year old pleasant white female seen today for follow-up ongoing tobacco abuse with a history of diagnosis of COPD with previous admissions with pneumonia and COPD exacerbation with abnormal CT chest that shows bullous emphysema on the left side with a previous history of left-sided pneumonia left-sided hydropneumothorax,In the setting of chronic hypoxic respiratory failure  Chronic Hypoxic resp failure due to COPD -Patient benefits from oxygen  therapy 2L   at night   COPD mild to moderate Continue Spiriva  Respimat at this time Albuterol  as needed No indication for antibiotics or prednisone at this time Avoid Allergens and Irritants Avoid secondhand smoke Avoid SICK contacts Recommend  Masking  when appropriate Recommend Keep up-to-date with vaccinations   Recommend smoking cessation Nicotine  patches prescribed  Follow up Lung cancer screening protocol CT scans reviewed in detail with patient last scan  May 2025 No masses or nodules seen   MEDICATION ADJUSTMENTS/LABS AND TESTS ORDERED: Please stop smoking Continue Spiriva  as prescribed Follow-up LDCT annually  CURRENT MEDICATIONS REVIEWED AT LENGTH WITH PATIENT TODAY We have reviewed smoking cessation, Low-dose CT, medication management, prescription of nicotine  patches, avoidance of triggers  Patient  satisfied with Plan of action and management. All questions answered   Follow up 6 months   I spent a total of 53 minutes dedicated to the care of this patient on the date of this encounter to include pre-visit review of records, face-to-face time with the patient discussing conditions above, post visit ordering of testing, clinical documentation with the electronic health record, making appropriate referrals as documented, and communicating necessary information to the patient's healthcare team.    The Patient requires high complexity decision making for assessment and support, frequent evaluation and titration of therapies, application of advanced monitoring technologies and extensive interpretation of multiple databases.  Patient satisfied with Plan of action and management. All questions answered    Nickolas Alm Cellar, M.D.  [facility]  Medical Director [facility] [place]

## 2024-10-21 ENCOUNTER — Ambulatory Visit

## 2024-11-07 ENCOUNTER — Encounter: Payer: Self-pay | Admitting: Internal Medicine

## 2024-11-08 NOTE — Progress Notes (Signed)
 Cardiology Office Note  Date:  11/11/2024   ID:  Kim, Braun 1961-05-27, MRN [id number]  PCP:  [name], MD   Chief Complaint  Patient presents with   12 month follow up     Patient c/o shortness of breath with climbing up about 16 steps.     HPI:  [name] is a 63 year old woman with past medical history of Smoking, <1/2 pack COPD/emphysema Hypertension Hyperlipidemia Mildly depressed ejection fraction on echo November 2020 Left bundle branch block Memory/cognitive impairment Three-vessel coronary calcification, on CT scan no significant stenosis by catheterization November 2015 Who presents for follow-up of her tachycardia, chest pain  Last seen by myself in clinic 6/24 Has RA, tries to stay active Outside gardening in the warmer weather In the winter, no regular exercise program  Up-and-down stairs at home,  16 stairs at a time, multiple trips  Denies chest pain or shortness of breath on exertion Reports that she misses her medications at times  CT scan with significant coronary calcification  Lab work reviewed A1C 6.0 Total cholesterol 218 LDL 96 Unclear if she is taking her Crestor  20  EKG personally reviewed by myself on todays visit EKG Interpretation Date/Time:  Monday November 11 2024 10:31:51 EST Ventricular Rate:  103 PR Interval:  168 QRS Duration:  118 QT Interval:  396 QTC Calculation: 518 R Axis:   40  Text Interpretation: Sinus tachycardia Minimal voltage criteria for LVH, may be normal variant ( Cornell product ) Incomplete left bundle branch block When compared with ECG of 15-Jan-2024 10:10, QRS axis Shifted left Questionable change in initial forces of Lateral leads Confirmed by Perla Lye [phone]) on 11/11/2024 10:47:57 AM    Left heart catheterization November 2015, No significant coronary disease, mild proximal calcification of the LAD, normal LV function  Er 11/25/21: chest pain, on left Hurt to take a  breath,coughing CT scanning images reviewed  No evidence of pulmonary embolism. 2. Small left hydropneumothorax. 3. Consolidation in the left base is suspicious for pneumonia. 4. Background of centrilobular and paraseptal emphysema with large left upper lobe bullae. Aortic Atherosclerosis 3 vessel CAD CT images pulled up, heavy three-vessel coronary calcification noted    PMH:   has a past medical history of C. difficile diarrhea (10/29/2022), C. difficile diarrhea (10/29/2022), CAP (community acquired pneumonia) (12/20/2021), COPD (chronic obstructive pulmonary disease) (HCC) (01/2019), COVID-19 virus infection (07/23/2021), History of cardiac murmur as a child, History of gestational diabetes, Hypertension, Left bundle branch block, Rheumatoid arthritis(714.0), Smoker, and Thyroid  nodule.  PSH:    Past Surgical History:  Procedure Laterality Date   ABDOMINAL HYSTERECTOMY  12/19/2012   Procedure: HYSTERECTOMY ABDOMINAL;  Surgeon: Rexene PARAS. Rosalva, MD;  Location: [facility];  Service: Gynecology;  Laterality: N/A;  Incision @ 1517   ANTERIOR CERVICAL DECOMP/DISCECTOMY FUSION N/A 07/17/2013   Cervical four-five, Cervical five-six Anterior cervical decompression/diskectomy/fusion;  Surgeon: Catalina CHRISTELLA Stains, MD;  Location: [facility];  Service: Neurosurgery;  Laterality: N/A;  Cervical four-five, Cervical five-six Anterior cervical decompression/diskectomy/fusion   BILATERAL SALPINGECTOMY  12/19/2012   Procedure: BILATERAL SALPINGECTOMY;  Surgeon: Rexene PARAS. Rosalva, MD;  Location: [facility];  Service: Gynecology;  Laterality: Bilateral;   CARDIAC CATHETERIZATION  09/30/2014   Procedure: LEFT HEART CATH AND CORONARY ANGIOGRAPHY;  Surgeon: Erick JONELLE Bergamo, MD;  Location: [facility] CATH LAB;  Service: Cardiovascular;;   CESAREAN SECTION     COLON SURGERY  1992   CYSTOSCOPY  12/19/2012   Procedure: CYSTOSCOPY;  Surgeon: Rexene PARAS.  Rosalva, MD;  Location: [facility];  Service: Gynecology;  Laterality: N/A;   ROBOTIC ASSISTED TOTAL  HYSTERECTOMY  12/19/2012   total hysterectomy for fibroids, ovaries removed Harlan DOROTHA Rosalva)    Current Outpatient Medications  Medication Sig Dispense Refill   albuterol  (PROVENTIL  HFA;VENTOLIN  HFA) 108 (90 BASE) MCG/ACT inhaler Inhale 2 puffs into the lungs daily as needed for wheezing or shortness of breath.     alendronate (FOSAMAX) 70 MG tablet TAKE 1 TABLET BY MOUTH ONCE WEEKLY 30 MINS PRIOR TO THE FIRST FOOD/BEVERAGE OF THE DAY 28 12 tablet 4   aspirin  EC 81 MG tablet Take 1 tablet (81 mg total) by mouth daily. Swallow whole. 90 tablet 3   betamethasone  dipropionate 0.05 % cream Apply topically 2 (two) times daily. As needed 30 g 0   Calcium  Carbonate-Vit D-Min (CALTRATE PLUS PO) Take by mouth daily.     Etanercept (ENBREL MINI) 50 MG/ML SOCT Inject into the skin.     Multiple Vitamins-Minerals (MULTIVITAMIN ADULTS 50+) TABS Take 1 tablet by mouth daily.     potassium chloride  (KLOR-CON  M) 10 MEQ tablet Take 1 tablet (10 mEq total) by mouth daily. 90 tablet 3   rosuvastatin  (CRESTOR ) 20 MG tablet Take 1 tablet (20 mg total) by mouth daily. 90 tablet 3   Tiotropium Bromide Monohydrate  (SPIRIVA  RESPIMAT) 2.5 MCG/ACT AERS Inhale 2 puffs into the lungs daily. 4 g 11   valsartan  (DIOVAN ) 80 MG tablet Take 1 tablet (80 mg total) by mouth daily. 90 tablet 3   nicotine  (NICODERM CQ ) 21 mg/24hr patch Place 1 patch (21 mg total) onto the skin daily. (Patient not taking: Reported on 11/11/2024) 28 patch 0   No current facility-administered medications for this visit.     Allergies:   Patient has no known allergies.   Social History:  The patient  reports that she has been smoking cigarettes. She has a 22 pack-year smoking history. She has never used smokeless tobacco. She reports current alcohol use of about 14.0 - 24.0 standard drinks of alcohol per week. She reports that she does not use drugs.   Family History:   family history includes Breast cancer in her sister; CAD in her brother and  mother; Diabetes in her sister; Hypertension in her brother, brother, and sister; Stroke in her mother.    Review of Systems: Review of Systems  Constitutional: Negative.   HENT: Negative.    Respiratory: Negative.    Cardiovascular: Negative.   Gastrointestinal: Negative.   Musculoskeletal: Negative.   Neurological: Negative.   Psychiatric/Behavioral: Negative.    All other systems reviewed and are negative.    PHYSICAL EXAM: VS:  BP 130/60 (BP Location: Left Arm, Patient Position: Sitting, Cuff Size: Normal)   Pulse (!) 103   Ht 5' 3 (1.6 m)   Wt 105 lb 8 oz (47.9 kg)   LMP 12/03/2012   SpO2 95%   BMI 18.69 kg/m  , BMI Body mass index is 18.69 kg/m. Constitutional:  oriented to person, place, and time. No distress.  HENT:  Head: Normocephalic and atraumatic.  Eyes:  no discharge. No scleral icterus.  Neck: Normal range of motion. Neck supple. No JVD present.  Cardiovascular: Normal rate, regular rhythm, normal heart sounds and intact distal pulses. Exam reveals no gallop and no friction rub. No edema No murmur heard. Pulmonary/Chest: Effort normal and breath sounds normal. No stridor. No respiratory distress.  no wheezes.  no rales.  no tenderness.  Abdominal: Soft.  no distension.  no tenderness.  Musculoskeletal: Normal range of motion.  no  tenderness or deformity.  Neurological:  normal muscle tone. Coordination normal. No atrophy Skin: Skin is warm and dry. No rash noted. not diaphoretic.  Psychiatric:  normal mood and affect. behavior is normal. Thought content normal.   Recent Labs: 01/15/2024: Hemoglobin 12.6; Platelets 202 04/02/2024: ALT 22; BUN 8; Creatinine, Ser 0.62; Potassium 4.1; Sodium 136; TSH 0.93    Lipid Panel Lab Results  Component Value Date   CHOL 218 (H) 04/02/2024   HDL 107.00 04/02/2024   LDLCALC 96 04/02/2024   TRIG 76.0 04/02/2024      Wt Readings from Last 3 Encounters:  11/11/24 105 lb 8 oz (47.9 kg)  10/09/24 107 lb 3.2 oz (48.6  kg)  05/02/24 104 lb 4 oz (47.3 kg)     ASSESSMENT AND PLAN:  Problem List Items Addressed This Visit       Cardiology Problems   Hypertension   Relevant Orders   EKG 12-Lead (Completed)   Atherosclerosis of aorta - Primary   Relevant Orders   EKG 12-Lead (Completed)   Hyperlipidemia   CAD (coronary artery disease)   Relevant Orders   EKG 12-Lead (Completed)   Left bundle branch block   Relevant Orders   EKG 12-Lead (Completed)     Other   Centrilobular emphysema (HCC)   Coronary disease with stable angina Heavy three-vessel coronary calcification on CT scan,  On her last clinic visit Crestor  was increased up to 20 daily, unfortunately cholesterol numbers went higher Stressed the importance of taking her rosuvastatin  Denies anginal symptoms Goal LDL less than 55 Previously declined Zetia  Tachycardia Asymptomatic, not on beta-blocker Heart rate better controlled on prior clinic visits in the 80-90 range  COPD/emphysema/smoking Smoking cessation recommended  Hyperlipidemia Does not want zetia , another pill Stressed the importance of taking crestor  to 20 mg daily Goal LDL <55    Signed, Velinda Lunger, M.D., Ph.D. [facility] [place], [place] [phone]

## 2024-11-11 ENCOUNTER — Encounter: Payer: Self-pay | Admitting: Cardiovascular Disease

## 2024-11-11 ENCOUNTER — Ambulatory Visit: Attending: Cardiovascular Disease | Admitting: Cardiovascular Disease

## 2024-11-11 ENCOUNTER — Other Ambulatory Visit: Payer: Self-pay

## 2024-11-11 ENCOUNTER — Telehealth: Payer: Self-pay | Admitting: Internal Medicine

## 2024-11-11 VITALS — BP 130/60 | HR 103 | Ht 63.0 in | Wt 105.5 lb

## 2024-11-11 DIAGNOSIS — I447 Left bundle-branch block, unspecified: Secondary | ICD-10-CM | POA: Diagnosis not present

## 2024-11-11 DIAGNOSIS — J432 Centrilobular emphysema: Secondary | ICD-10-CM | POA: Diagnosis not present

## 2024-11-11 DIAGNOSIS — I25118 Atherosclerotic heart disease of native coronary artery with other forms of angina pectoris: Secondary | ICD-10-CM | POA: Diagnosis not present

## 2024-11-11 DIAGNOSIS — I10 Essential (primary) hypertension: Secondary | ICD-10-CM

## 2024-11-11 DIAGNOSIS — J9611 Chronic respiratory failure with hypoxia: Secondary | ICD-10-CM

## 2024-11-11 DIAGNOSIS — J449 Chronic obstructive pulmonary disease, unspecified: Secondary | ICD-10-CM

## 2024-11-11 DIAGNOSIS — I700 Atherosclerosis of aorta: Secondary | ICD-10-CM | POA: Diagnosis not present

## 2024-11-11 DIAGNOSIS — E7800 Pure hypercholesterolemia, unspecified: Secondary | ICD-10-CM

## 2024-11-11 MED ORDER — SPIRIVA RESPIMAT 2.5 MCG/ACT IN AERS: 2.0000 | INHALATION_SPRAY | Freq: Every day | RESPIRATORY_TRACT | 11 refills | Status: AC

## 2024-11-11 NOTE — Telephone Encounter (Signed)
 Patient needs refill for Spiriva  Respimat. Pharmacy is [facility] [place]. Patient phone number is [phone].

## 2024-11-11 NOTE — Patient Instructions (Addendum)
 Medication Instructions: Your physician recommends that you continue on your current medications as directed. Please refer to the Current Medication list given to you today.   **Don't forget to take crestor **  If you need a refill on your cardiac medications before your next appointment, please call your pharmacy.   Lab work: No new labs needed  Testing/Procedures: No new testing needed  Follow-Up: At [facility], you and your health needs are our priority.  As part of our continuing mission to provide you with exceptional heart care, we have created designated Provider Care Teams.  These Care Teams include your primary Cardiologist (physician) and Advanced Practice Providers (APPs -  Physician Assistants and Nurse Practitioners) who all work together to provide you with the care you need, when you need it.  You will need a follow up appointment in 12 months  Providers on your designated Care Team:   Lonni Meager, NP Bernardino Bring, PA-C Cadence [place], [place]  COVID-19 Vaccine Information can be found at: [email or web address] For questions related to vaccine distribution or appointments, please email [email or web address] or call [phone].

## 2024-11-11 NOTE — Telephone Encounter (Signed)
 Refill has been submitted to pharmacy below.

## 2024-11-15 ENCOUNTER — Ambulatory Visit: Payer: Self-pay

## 2024-11-15 ENCOUNTER — Other Ambulatory Visit: Payer: Self-pay | Admitting: Family Medicine

## 2024-11-15 ENCOUNTER — Ambulatory Visit: Payer: Self-pay | Admitting: *Deleted

## 2024-11-15 MED ORDER — CEPHALEXIN 500 MG PO CAPS: 500.0000 mg | ORAL_CAPSULE | Freq: Two times a day (BID) | ORAL | 0 refills | Status: AC

## 2024-11-15 NOTE — Progress Notes (Signed)
E Rx keflex.

## 2024-11-15 NOTE — Telephone Encounter (Signed)
 Spoke with patient - urinary urgency, frequency, incomplete emptying, red with wiping but without blood clots. Dysuria.  No fevers/chills, nausea/vomiting, flank pain.  Reviewed ER/urgent care precautions.  ERx keflex  course.  If no better, she will need to come into office for OV.  In smoker especially important.

## 2024-11-15 NOTE — Telephone Encounter (Signed)
"   Call disconnected prior to transfer from PAS. Dur to systems issues unable to call patient back at this time.     Copied from CRM #[id number]. Topic: Clinical - Red Word Triage >> Nov 15, 2024  2:50 PM Roselie BROCKS wrote: Red Word that prompted transfer to Nurse Triage: Patient states she feels she has a UTI, she is unable to urinate, has bleeding when trying to urinate. >> Nov 15, 2024  3:08 PM Roselie C wrote: Call dropped and lost patient.no answer at call back.  >> Nov 15, 2024  3:06 PM China J wrote: Patient calling back for transfer since the previous call with Roselie (E2C2 agent) was disconnected. "

## 2024-11-15 NOTE — Telephone Encounter (Signed)
 FYI Only or Action Required?: Action required by provider: clinical question for provider and update on patient condition.  Patient was last seen in primary care on 05/02/2024 by [name], MD.  Called Nurse Triage reporting Urinary Retention.  Symptoms began yesterday.  Interventions attempted: Nothing.  Symptoms are: unchanged.  Triage Disposition: See Physician Within 24 Hours  Patient/caregiver understands and will follow disposition?: No, wishes to speak with PCP    Copied from CRM #[id number]. Topic: Clinical - Red Word Triage >> Nov 15, 2024  2:50 PM Roselie BROCKS wrote: Red Word that prompted transfer to Nurse Triage: Patient states she feels she has a UTI, she is unable to urinate, has bleeding when trying to urinate. >> Nov 15, 2024  3:08 PM Roselie C wrote: Call dropped and lost patient.no answer at call back.  >> Nov 15, 2024  3:06 PM China J wrote: Patient calling back for transfer since the previous call with Roselie (E2C2 agent) was disconnected. Reason for Disposition  [1] Pink or red-colored urine and likely from food (beets, rhubarb, red food dye) AND [2] lasts > 24 hours after stopping food  Answer Assessment - Initial Assessment Questions Pt called in [facility] Questioned pt regarding symptoms and pt stated she knows it is a UTI and she has had symptoms before. Discussed if pt had any burning or pain with urination or frequency and she denied. Denied flank pain but stated when she presses on her abdomen where her bladder is, it hurts. Pt stated she is able to urinated and this morning when she wiped there was a pink hue on the toilet paper. Denied blood in urine, change in urine color or foul odor. Discussed without appt unable to know if symptoms were related to UTI. Discussed with pelvic discomfort and blood in urine, these would warrant higher level of care. Pt declined and stated she can't go anywhere today and just wants rx.  Please advise.      1. COLOR of URINE: Describe the color of the urine.  (e.g., tea-colored, pink, red, bloody) Do you have blood clots in your urine? (e.g., none, pea, grape, small coin)     Yellow; denies clots, states that there was blood present on toilet paper   2. ONSET: When did the bleeding start?      Today x 1 occurrence   3. EPISODES: How many times has there been blood in the urine? or How many times today?     Once today   4. PAIN with URINATION: Is there any pain with passing your urine? If Yes, ask: How bad is the pain?  (Scale 1-10; or mild, moderate, severe)     none  5. FEVER: Do you have a fever? If Yes, ask: What is your temperature, how was it measured, and when did it start?     none  6. ASSOCIATED SYMPTOMS: Are you passing urine more frequently than usual?    no      7. OTHER SYMPTOMS: Do you have any other symptoms? (e.g., back/flank pain, abdomen pain, vomiting)    Abdominal pain when pushing on stomach where bladder is located  Protocols used: Urine - Blood In-A-AH

## 2024-12-26 ENCOUNTER — Other Ambulatory Visit (HOSPITAL_COMMUNITY): Payer: Self-pay | Admitting: Internal Medicine

## 2024-12-26 NOTE — Progress Notes (Signed)
 Confirmation received from Dr. Mai to disregard Prolia  order  Sherry Pennant, PharmD, MPH, BCPS, CPP Clinical Pharmacist

## 2024-12-26 NOTE — Progress Notes (Signed)
 Received Prolia  order. Per chart review, patient had reaction to Prolia .  Reviewed note from [facility] - 09/12/2025 - patient had acute reaction to Prolia  in April.   3. Age-related osteoporosis without current pathological fracture  Stop Prolia  Solution Prefilled Syringe, 60 MG/ML, 60mg , Subcutaneous, every 6 months.   Clinical Notes: discussed in detail with her. She had DEXA 09/2022 with T-score -2.3, but FRAX score with elevated fracture risk. Based on record review, it appears she has failed bisphosphonates or was intolerant, and Prolia  was started 09/2023. She reports having acute reaction to last Prolia  dose in April 2025, with acute dyspnea. Based on this, we will stop Prolia . She would like to defer additional treatments for now, so we will repeat her DEXA in November 2025, and go from there. Recommend vitamin D  1000 units daily.     Last dose: 04/18/2024  Sherry Pennant, PharmD, MPH, BCPS, CPP Clinical Pharmacist

## 2025-03-06 ENCOUNTER — Ambulatory Visit: Admitting: Dermatology

## 2025-03-11 ENCOUNTER — Ambulatory Visit

## 2025-03-18 ENCOUNTER — Ambulatory Visit

## 2025-03-27 ENCOUNTER — Ambulatory Visit

## 2025-04-30 ENCOUNTER — Other Ambulatory Visit

## 2025-05-07 ENCOUNTER — Encounter: Admitting: Family Medicine
